# Patient Record
Sex: Female | Born: 1980
Health system: Southern US, Community
[De-identification: ages and names within clinical notes are randomized; demographics above are authoritative.]

## PROBLEM LIST (undated history)

## (undated) ENCOUNTER — Inpatient Hospital Stay: Payer: Self-pay

## (undated) DIAGNOSIS — N939 Abnormal uterine and vaginal bleeding, unspecified: Secondary | ICD-10-CM

## (undated) DIAGNOSIS — R7301 Impaired fasting glucose: Secondary | ICD-10-CM

## (undated) DIAGNOSIS — N920 Excessive and frequent menstruation with regular cycle: Secondary | ICD-10-CM

## (undated) DIAGNOSIS — M793 Panniculitis, unspecified: Secondary | ICD-10-CM

## (undated) DIAGNOSIS — D649 Anemia, unspecified: Secondary | ICD-10-CM

## (undated) DIAGNOSIS — I10 Essential (primary) hypertension: Secondary | ICD-10-CM

## (undated) DIAGNOSIS — Z903 Acquired absence of stomach [part of]: Secondary | ICD-10-CM

## (undated) DIAGNOSIS — E785 Hyperlipidemia, unspecified: Secondary | ICD-10-CM

## (undated) DIAGNOSIS — O24419 Gestational diabetes mellitus in pregnancy, unspecified control: Secondary | ICD-10-CM

## (undated) DIAGNOSIS — E119 Type 2 diabetes mellitus without complications: Secondary | ICD-10-CM

## (undated) DIAGNOSIS — R7303 Prediabetes: Secondary | ICD-10-CM

## (undated) HISTORY — PX: WISDOM TOOTH EXTRACTION: SHX21

## (undated) HISTORY — DX: Morbid (severe) obesity due to excess calories: E66.01

## (undated) HISTORY — DX: Impaired fasting glucose: R73.01

## (undated) HISTORY — DX: Prediabetes: R73.03

---

## 2005-02-19 ENCOUNTER — Inpatient Hospital Stay: Payer: Self-pay | Admitting: Obstetrics & Gynecology

## 2007-07-06 ENCOUNTER — Encounter: Payer: Self-pay | Admitting: Maternal & Fetal Medicine

## 2007-12-16 ENCOUNTER — Observation Stay: Payer: Self-pay

## 2008-01-03 ENCOUNTER — Inpatient Hospital Stay: Payer: Self-pay

## 2012-09-18 ENCOUNTER — Encounter: Payer: Self-pay | Admitting: Maternal & Fetal Medicine

## 2012-10-02 ENCOUNTER — Encounter: Payer: Self-pay | Admitting: Obstetrics and Gynecology

## 2012-12-14 ENCOUNTER — Inpatient Hospital Stay: Payer: Self-pay

## 2012-12-14 LAB — CBC WITH DIFFERENTIAL/PLATELET
Basophil #: 0.1 10*3/uL (ref 0.0–0.1)
Basophil %: 0.4 %
Eosinophil #: 0.1 10*3/uL (ref 0.0–0.7)
Eosinophil %: 1.1 %
HCT: 38.2 % (ref 35.0–47.0)
HGB: 13 g/dL (ref 12.0–16.0)
Lymphocyte #: 3.2 10*3/uL (ref 1.0–3.6)
Lymphocyte %: 25.2 %
MCH: 29.4 pg (ref 26.0–34.0)
MCHC: 34.1 g/dL (ref 32.0–36.0)
MCV: 86 fL (ref 80–100)
Monocyte #: 0.9 x10 3/mm (ref 0.2–0.9)
Monocyte %: 6.9 %
Neutrophil #: 8.4 10*3/uL — ABNORMAL HIGH (ref 1.4–6.5)
Neutrophil %: 66.4 %
Platelet: 233 10*3/uL (ref 150–440)
RBC: 4.43 10*6/uL (ref 3.80–5.20)
RDW: 15.4 % — ABNORMAL HIGH (ref 11.5–14.5)
WBC: 12.6 10*3/uL — ABNORMAL HIGH (ref 3.6–11.0)

## 2012-12-15 LAB — HEMATOCRIT: HCT: 35.6 % (ref 35.0–47.0)

## 2014-12-06 NOTE — Consult Note (Signed)
Referral Information:   Reason for Referral 34 yo [id number] with EDD 12/16/12 by [facility]; measurements were consistent with 9 weeks 4 days. She is presently 27 weeks 2 days and referred to discuss options for recurrent preterm birth prevention. Her second pregnancy ended at 30 weeks following spontaneous preterm labor. She does report UTI treatment prior to onset of preterm labor.  Her subsequent pregnancies (and first pregnancy) were term.  She took 17P injections with her last pregnancy.  The ultrasound report from your office performed on 09/07/12 (25 weeks 5 days)demonstrates overall cervical length of 3.8cm, but with funneling present.  She reports intermittent, nonsustained contractions during the day.  She is very active and involved with the care of her 2 boys with severe autism.    Referring Physician [facility]    Prenatal Hx As above    Past Obstetrical Hx 2002, 38 weeks,  female  6lb 15 oz,  C/S arrest of descent/OP,  [facility] 2003, 30 weeks, female, 3lb 9 oz, diagnosed with UTI developed spontaneous preterm labor and [facility], infant remained in ICN x 6 weeks SAB at 10 weeks 2006, 39 weeks, 7lb 8 oz,  female, infant developed meningits(enterovirus) one week after birth, now with severe autism spectrum disorder 2009, 40 weeks, 8lb 6 oz, female, weekly 17P injections  In 2008, she and her husband adopted female infant who was also later diagnosed with autism   Home Medications: Medication Instructions Status  Tylenol 325 mg oral tablet 2 tab(s) orally every 3 hours as needed for pain Active  ibuprofen 400 mg oral tablet 1 tab(s) orally every 6 hours as needed for  Active  ferrous sulfate 1 tab(s)  once a day for 6 weeks Active  Prenatal Multivitamins oral tablet 1 daily while breastfeeding Active   Allergies:   NKA: None  Vital Signs/Notes:  Nursing Vital Signs: **Vital Signs.:   03-Feb-14 12:46   Vital Signs Type Routine    Temperature Temperature (F) 98.1   Celsius 36.7   Temperature Source oral   Pulse Pulse 89   Respirations Respirations 18   Systolic BP Systolic BP 132   Diastolic BP (mmHg) Diastolic BP (mmHg) 71   Mean BP 91   Perinatal Consult:   PMed Hx Rubella Immune, Hx of varicella    PSurg Hx Cesarean section, 2002; wisdom tooth extraction    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 We addressed the lack of benefit to 17OHP injections for recurrent preterm birth prevention in this setting.  She is beyond the upper gestational age for benefit.  It is unclear whether vaginal progesterone will benefit.  Her normal midtrimester cervical length, prior history of 3 term deliveries, and overall normal cervical length are favorable factors for her.  --We did, however, address the possible utility of screening tests such as fetal fibronectin should she continue to experience preterm contractions.  We addressed the test's negative predictive value and the reassurance this testing could provide because a negative result would provide assurance ~98%chance she would not deliver within the 2 weeks after the test was taken.  --We addressed the possible need for hospitalization and steroid administration should she experience cervical changes and contractions.  She  is wary of this situation due to her role as primary caregiver for her sons with special needs.    Recommendations 1. Recommend anatomic survey and cervical length at [facility] in 2 weeks.  A dynamic cervix is of unclear clinical significance, however, if cervical shortening is present and she endorses contractions, we will recommend possible admission and steroid  administration. 2. Consider fetal fibronectin at the time of her next office visit if concern for cervical shortening remains.  If positive, continue close surveillance and consider steroid administration.     Total Time Spent with Patient 30 minutes    >50% of visit spent in couseling/coordination of care yes   Coding Description: MATERNAL CONDITIONS/HISTORY INDICATION(S).   Previous C-section.   Threatened premature labor > 22 weeks, < 37 weeks.  Electronic Signatures: Consuelo Pandy (MD)  (Signed 03-Feb-14 14:52)  Authored: Referral, Home Medications, Allergies, Vital Signs/Notes, Consult, Lab/Radiology Notes, Impression, Billing, Coding Description   Last Updated: 03-Feb-14 14:52 by Consuelo Pandy (MD)

## 2014-12-24 NOTE — H&P (Signed)
L&D Evaluation:  History:  HPI 34yo [id number] at 39wks by 9wk U/S presents in labor.   [facility]  PNL - A+, RI , VI, GBS neg   Patient's Medical History No Chronic Illness   Patient's Surgical History Previous C-Section  follwed by 3 VBACs   Medications Pre Natal Vitamins   Allergies NKDA   Social History none   Family History Non-Contributory   ROS:  ROS All systems were reviewed.  HEENT, CNS, GI, GU, Respiratory, CV, Renal and Musculoskeletal systems were found to be normal.   Exam:  Vital Signs stable   General mild distress with contractions   Mental Status clear   Abdomen gravid, non-tender   Estimated Fetal Weight Average for gestational age   Pelvic 4/ 100 / -1   Mebranes Intact   FHT normal rate with no decels   Ucx regular   Skin dry   Lymph no lymphadenopathy   Impression:  Impression 34yo G6P4 at 39wks with TOLAC   Plan:  Plan EFM/NST, monitor contractions and for cervical change   Comments - 3 prior successful VBACs   Electronic Signatures: Orvan FalconerStansbury Clipp, Amadea Keagy K (MD)  (Signed 01-May-14 05:57)  Authored: L&D Evaluation   Last Updated: 01-May-14 05:57 by Garnette GunnerStansbury Clipp, Ali LoweEryn K (MD)

## 2015-02-28 ENCOUNTER — Encounter: Payer: Self-pay | Admitting: Family Medicine

## 2015-02-28 ENCOUNTER — Ambulatory Visit (INDEPENDENT_AMBULATORY_CARE_PROVIDER_SITE_OTHER): Payer: BLUE CROSS/BLUE SHIELD | Admitting: Family Medicine

## 2015-02-28 VITALS — BP 128/85 | HR 98 | Temp 98.8°F | Wt 232.0 lb

## 2015-02-28 DIAGNOSIS — R7301 Impaired fasting glucose: Secondary | ICD-10-CM | POA: Diagnosis not present

## 2015-02-28 HISTORY — DX: Morbid (severe) obesity due to excess calories: E66.01

## 2015-02-28 MED ORDER — BUPROPION HCL ER (XL) 150 MG PO TB24: 150.0000 mg | ORAL_TABLET | Freq: Every day | ORAL | Status: DC

## 2015-02-28 NOTE — Assessment & Plan Note (Signed)
Weight management discussion; adequate hydration, activity; start bupropion; avoid phentermine; avoid topirimate (still child-bearing possibility); hand-out form [email or web address] given

## 2015-02-28 NOTE — Assessment & Plan Note (Signed)
Check A1C today; weight loss encouraged

## 2015-02-28 NOTE — Progress Notes (Signed)
BP 128/85 mmHg  Pulse 98  Temp(Src) 98.8 F (37.1 C)  Wt 232 lb (105.235 kg)  SpO2 96%  LMP 02/17/2015 (Approximate)   Subjective:    Patient ID: Bonnie Armstrong, female    DOB: November 18, 1980, 34 y.o.   MRN: [id number]  HPI: Bonnie Armstrong is a 34 y.o. female  Chief Complaint  Patient presents with  . Establish Care    Would like to also discuss prediabeties   She is here to establish with me (not new to the practice) She has been under the care of an OB-GYN Two severely disabled children, high stress at home; husband is bipolar; sleep deprivation for the last 10 years, 6 children at home; she told her OB-GYN about this and they checked her for sugar; she has been more than 200 pounds for most of her marriage Borderline diabetes with the last bloodwork, they did the 3 month average and it was in the 6 range, "knocking at the door" Has always struggled with food; lost 30 pounds after hearing that, it was a wake up call, then gained back There is no changing the home environment; the reality is that she is health problems, does not want excuses, knows she puts the food in her mouth; can't just stop; has tried 3 programs in the last year with her mother's help; grandmothers were almost 300 pounds, mother is tiny; great-grandmother also 300 pounds at one point, then down to 200 pounds before she passed Maternal grandfather had type 2 diabetes The test was a year ago in August; she has been checking Highest weight was 250 pounds She has only dipped under 200 pounds three times during her marriage over last 16 years She keeps her food separate, driving in vehicle and going to [employer] for her autistic son She would love to get under 200 pounds Her last cholesterol panel was normal She had a pap smear last summer She can lose weight, but she knows she'll never change the circumstances; she is a stress eater  Relevant past medical, surgical, family and social history reviewed and updated  as indicated. Interim medical history since our last visit reviewed. Mother had pituitary tumor; also hypothyroidism  Allergies and medications reviewed and updated.  Review of Systems  Per HPI unless specifically indicated above     Objective:    BP 128/85 mmHg  Pulse 98  Temp(Src) 98.8 F (37.1 C)  Wt 232 lb (105.235 kg)  SpO2 96%  LMP 02/17/2015 (Approximate)  Wt Readings from Last 3 Encounters:  02/28/15 232 lb (105.235 kg)  06/30/12 228 lb (103.42 kg)    Physical Exam  Constitutional: She appears well-developed and well-nourished.  HENT:  Mouth/Throat: Mucous membranes are normal.  Eyes: No scleral icterus.  Neck: No thyromegaly present.  Cardiovascular: Normal rate and regular rhythm.   Pulmonary/Chest: Effort normal and breath sounds normal.  Abdominal: She exhibits no distension.  Neurological: She is alert. She displays no tremor.  Skin: Skin is warm. No pallor.  Psychiatric: She has a normal mood and affect. Judgment and thought content normal. She is hyperactive (a little hyperactive, talkative, but redirectable).  donut for breakfast; coffee two hours ago, no lunch      Assessment & Plan:   Problem List Items Addressed This Visit      Endocrine   Impaired fasting glucose - Primary    Check A1C today; weight loss encouraged      Relevant Orders   Hgb A1c w/o eAG  Comprehensive metabolic panel   Lipid Panel w/o Chol/HDL Ratio     Other   Morbid obesity    Weight management discussion; adequate hydration, activity; start bupropion; avoid phentermine; avoid topirimate (still child-bearing possibility); hand-out form [email or web address] given          Follow up plan: Return in about 6 weeks (around 04/11/2015) for prediabetes, weight management.

## 2015-02-28 NOTE — Patient Instructions (Addendum)
Check out the information at [email or web address] entitled "What It Takes to Lose Weight" Try to lose between 1-2 pounds per week by taking in fewer calories and burning off more calories You can succeed by limiting portions, limiting foods dense in calories and fat, becoming more active, and drinking 8 glasses of water a day Don't skip meals, especially breakfast, as skipping meals may alter your metabolism Do not use over-the-counter weight loss pills or gimmicks that claim rapid weight loss A healthy BMI (or body mass index) is between 18.5 and 24.9 You can calculate your ideal BMI at the NIH website [employer]huhttp://www.nhlbi.nih.gov/health/educational/lose_wt/BMI/bmicalc.htm Let's start new medicine to help cut down on reward and cravings Return in 6 weeks for recheck

## 2015-03-01 LAB — LIPID PANEL W/O CHOL/HDL RATIO
Cholesterol, Total: 202 mg/dL — ABNORMAL HIGH (ref 100–199)
HDL: 52 mg/dL (ref >39)
LDL Calculated: 127 mg/dL — ABNORMAL HIGH (ref 0–99)
Triglycerides: 115 mg/dL (ref 0–149)
VLDL Cholesterol Cal: 23 mg/dL (ref 5–40)

## 2015-03-01 LAB — COMPREHENSIVE METABOLIC PANEL
ALT: 15 IU/L (ref 0–32)
AST: 13 IU/L (ref 0–40)
Albumin/Globulin Ratio: 2 (ref 1.1–2.5)
Albumin: 4.4 g/dL (ref 3.5–5.5)
Alkaline Phosphatase: 79 IU/L (ref 39–117)
BUN/Creatinine Ratio: 15 (ref 8–20)
BUN: 11 mg/dL (ref 6–20)
Bilirubin Total: <0.2 mg/dL (ref 0.0–1.2)
CO2: 24 mmol/L (ref 18–29)
Calcium: 8.9 mg/dL (ref 8.7–10.2)
Chloride: 102 mmol/L (ref 97–108)
Creatinine, Ser: 0.73 mg/dL (ref 0.57–1.00)
GFR calc Af Amer: 123 mL/min/{1.73_m2} (ref >59)
GFR calc non Af Amer: 106 mL/min/{1.73_m2} (ref >59)
Globulin, Total: 2.2 g/dL (ref 1.5–4.5)
Glucose: 95 mg/dL (ref 65–99)
Potassium: 4 mmol/L (ref 3.5–5.2)
Sodium: 142 mmol/L (ref 134–144)
Total Protein: 6.6 g/dL (ref 6.0–8.5)

## 2015-03-01 LAB — HGB A1C W/O EAG: Hgb A1c MFr Bld: 6.1 % — ABNORMAL HIGH (ref 4.8–5.6)

## 2015-03-05 ENCOUNTER — Encounter: Payer: Self-pay | Admitting: Family Medicine

## 2015-04-02 ENCOUNTER — Ambulatory Visit (INDEPENDENT_AMBULATORY_CARE_PROVIDER_SITE_OTHER): Payer: BLUE CROSS/BLUE SHIELD | Admitting: Family Medicine

## 2015-04-02 ENCOUNTER — Encounter: Payer: Self-pay | Admitting: Family Medicine

## 2015-04-02 VITALS — BP 129/85 | HR 93 | Temp 98.3°F | Wt 242.0 lb

## 2015-04-02 DIAGNOSIS — M25461 Effusion, right knee: Secondary | ICD-10-CM | POA: Diagnosis not present

## 2015-04-02 DIAGNOSIS — M25561 Pain in right knee: Secondary | ICD-10-CM

## 2015-04-02 DIAGNOSIS — R7301 Impaired fasting glucose: Secondary | ICD-10-CM

## 2015-04-02 NOTE — Patient Instructions (Addendum)
Ice the area of pain and swelling for 20 minutes at a time 3 times a day; never ice right up against skin Try turmeric as a natural anti-inflammatory (for pain and arthritis). It comes in capsules where you buy aspirin and fish oil, but also as a spice where you buy pepper and garlic powder. I have entered a referral for you for the orthopaedist If you have not heard anything from my staff in a week about any orders/referrals/studies from today, please contact us here to follow-up [phone] Please do work with a Veterinary surgeon, consider Ramond Dial or others Consider getting a bicycle inexpensively and keep the metabolism moving during the day Drink 64 ounces of water daily See the hand-out from [email or web address] Commit to increasing your activity Allow healthy snacks for yourself

## 2015-04-02 NOTE — Assessment & Plan Note (Signed)
We reached a breakthrough and realized counseling is key; refer to therapist and nutritionist

## 2015-04-02 NOTE — Progress Notes (Signed)
BP 129/85 mmHg  Pulse 93  Temp(Src) 98.3 F (36.8 C)  Wt 242 lb (109.77 kg)  SpO2 98%  LMP 03/19/2015 (Approximate)   Subjective:    Patient ID: Bonnie Armstrong, female    DOB: December 06, 1980, 34 y.o.   MRN: [id number]  HPI: Bonnie Armstrong is a 34 y.o. female  Chief Complaint  Patient presents with  . Weight Check    tried wellbutrin, but it made her gain weight  . Knee Pain    right knee   Her right knee is bothering her; she has a lump on the right leg; no injury to the knee; she sits on the floor, but nothing new there; the knee hurts; she has knee pain plus she is heavy; she has thought that if she loses weight, it won't hurt as bad; it will fill with fluid and it will go down; today's swelling is pretty small compared to other days; the swelling just started last week; used ACE compression bandage; no help; feels tight and spongy; tight when flexing; no heat, no redness; she has not tried anything else besides the compression bandage; the swelling is just prox and lateral to the right knee, but the pain is in the knee under the kneecap; no locking or popping; pain was throbbing like crazy yesterday  She actually gained weight on the wellbutrin; she says [name] has gone through a period of insomnia, took the edge of things during the day but nightime is hard; she says they are excuses; she says it is eventually going to affect health organs; the issues with her son are just getting worse; she took the wellbutrin for two weeks; the answer is not just the pill, but not dramatically helping; she is using food as a coping mechanism; she felt overweight in the 4th grade, has been looking at food differently, sneaking food and not letting people; 150 pounds in the 7th grade; weighed 165 pounds or 170 pounds when she got married; she was 200 pounds when she got pregnant the next year; she doesn't feel the problem is [name]; there is stress in the house; she knows she has some PTSD  Relevant  past medical, surgical, family and social history reviewed and updated as indicated. Interim medical history since our last visit reviewed. Allergies and medications reviewed and updated.  Review of Systems  Per HPI unless specifically indicated above     Objective:    BP 129/85 mmHg  Pulse 93  Temp(Src) 98.3 F (36.8 C)  Wt 242 lb (109.77 kg)  SpO2 98%  LMP 03/19/2015 (Approximate)  Wt Readings from Last 3 Encounters:  04/02/15 242 lb (109.77 kg)  02/28/15 232 lb (105.235 kg)  06/30/12 228 lb (103.42 kg)    Physical Exam  Constitutional: She appears well-developed and well-nourished. No distress.  obese  Eyes:  No proptosis or exophthalmos  Cardiovascular: Normal rate.   Pulmonary/Chest: Effort normal.  Abdominal: She exhibits no distension.  Musculoskeletal:       Right knee: She exhibits decreased range of motion, swelling and effusion. She exhibits no ecchymosis, no deformity and no erythema. No medial joint line, no lateral joint line, no MCL, no LCL and no patellar tendon tenderness noted.  Neurological: She is alert. She displays no tremor.  Psychiatric: She has a normal mood and affect. Her behavior is normal. Judgment and thought content normal.  Talkative, but very pleasant, good eye contact witih examiner    Results for orders placed or performed  in visit on 02/28/15  Hgb A1c w/o eAG  Result Value Ref Range   Hgb A1c MFr Bld 6.1 (H) 4.8 - 5.6 %  Comprehensive metabolic panel  Result Value Ref Range   Glucose 95 65 - 99 mg/dL   BUN 11 6 - 20 mg/dL   Creatinine, Ser 1.61 0.57 - 1.00 mg/dL   GFR calc non Af Amer 106 >59 mL/min/1.73   GFR calc Af Amer 123 >59 mL/min/1.73   BUN/Creatinine Ratio 15 8 - 20   Sodium 142 134 - 144 mmol/L   Potassium 4.0 3.5 - 5.2 mmol/L   Chloride 102 97 - 108 mmol/L   CO2 24 18 - 29 mmol/L   Calcium 8.9 8.7 - 10.2 mg/dL   Total Protein 6.6 6.0 - 8.5 g/dL   Albumin 4.4 3.5 - 5.5 g/dL   Globulin, Total 2.2 1.5 - 4.5 g/dL    Albumin/Globulin Ratio 2.0 1.1 - 2.5   Bilirubin Total <0.2 0.0 - 1.2 mg/dL   Alkaline Phosphatase 79 39 - 117 IU/L   AST 13 0 - 40 IU/L   ALT 15 0 - 32 IU/L  Lipid Panel w/o Chol/HDL Ratio  Result Value Ref Range   Cholesterol, Total 202 (H) 100 - 199 mg/dL   Triglycerides 096 0 - 149 mg/dL   HDL 52 >04 mg/dL   VLDL Cholesterol Cal 23 5 - 40 mg/dL   LDL Calculated 540 (H) 0 - 99 mg/dL      Assessment & Plan:   Problem List Items Addressed This Visit      Endocrine   Impaired fasting glucose    Weight loss key; last A1C is 6.1; absolutely explained that weight lsos can help this; check 6 months from last draw, January 15th or just after        Other   Morbid obesity    We reached a breakthrough and realized counseling is key; refer to therapist and nutritionist      Pain and swelling of right knee - Primary   Relevant Orders   Ambulatory referral to Orthopedic Surgery      Follow up plan: Return in about 6 weeks (around 05/14/2015).  Face-to-face time with patient was more than 25 minutes, >50% time spent counseling and coordination of care

## 2015-04-02 NOTE — Assessment & Plan Note (Signed)
Weight loss key; last A1C is 6.1; absolutely explained that weight lsos can help this; check 6 months from last draw, January 15th or just after

## 2015-04-11 ENCOUNTER — Ambulatory Visit: Payer: BLUE CROSS/BLUE SHIELD | Admitting: Family Medicine

## 2015-05-15 ENCOUNTER — Ambulatory Visit: Payer: BLUE CROSS/BLUE SHIELD | Admitting: Family Medicine

## 2015-07-07 ENCOUNTER — Encounter: Payer: Self-pay | Admitting: Family Medicine

## 2016-04-15 ENCOUNTER — Other Ambulatory Visit: Payer: Self-pay | Admitting: Family Medicine

## 2016-04-15 ENCOUNTER — Telehealth: Payer: Self-pay | Admitting: Family Medicine

## 2016-04-15 ENCOUNTER — Ambulatory Visit (INDEPENDENT_AMBULATORY_CARE_PROVIDER_SITE_OTHER): Payer: BLUE CROSS/BLUE SHIELD | Admitting: Family Medicine

## 2016-04-15 ENCOUNTER — Encounter: Payer: Self-pay | Admitting: Family Medicine

## 2016-04-15 VITALS — BP 128/70 | HR 96 | Temp 98.3°F | Resp 14 | Wt 244.0 lb

## 2016-04-15 DIAGNOSIS — R7301 Impaired fasting glucose: Secondary | ICD-10-CM

## 2016-04-15 DIAGNOSIS — Z0000 Encounter for general adult medical examination without abnormal findings: Secondary | ICD-10-CM

## 2016-04-15 DIAGNOSIS — Z124 Encounter for screening for malignant neoplasm of cervix: Secondary | ICD-10-CM

## 2016-04-15 DIAGNOSIS — Z789 Other specified health status: Secondary | ICD-10-CM | POA: Diagnosis not present

## 2016-04-15 DIAGNOSIS — R002 Palpitations: Secondary | ICD-10-CM

## 2016-04-15 DIAGNOSIS — R6889 Other general symptoms and signs: Secondary | ICD-10-CM

## 2016-04-15 LAB — HM PAP SMEAR: HM Pap smear: NEGATIVE

## 2016-04-15 NOTE — Assessment & Plan Note (Signed)
Check labs 

## 2016-04-15 NOTE — Assessment & Plan Note (Signed)
Lost 11 pounds over last month; check TSH

## 2016-04-15 NOTE — Assessment & Plan Note (Signed)
USPSTF grade A and B recommendations reviewed with patient; age-appropriate recommendations, preventive care, screening tests, etc discussed and encouraged; healthy living encouraged; see AVS for patient education given to patient  

## 2016-04-15 NOTE — Patient Instructions (Addendum)
Return tomorrow or next week for fasting labs  Check out the information at familydoctor.org entitled "Nutrition for Weight Loss: What You Need to Know about Fad Diets" Try to lose between 1-2 pounds per week by taking in fewer calories and burning off more calories You can succeed by limiting portions, limiting foods dense in calories and fat, becoming more active, and drinking 8 glasses of water a day (64 ounces) Don't skip meals, especially breakfast, as skipping meals may alter your metabolism Do not use over-the-counter weight loss pills or gimmicks that claim rapid weight loss A healthy BMI (or body mass index) is between 18.5 and 24.9 You can calculate your ideal BMI at the [place] website [employer]fr  Health Maintenance, Female Adopting a healthy lifestyle and getting preventive care can go a long way to promote health and wellness. Talk with your health care provider about what schedule of regular examinations is right for you. This is a good chance for you to check in with your provider about disease prevention and staying healthy. In between checkups, there are plenty of things you can do on your own. Experts have done a lot of research about which lifestyle changes and preventive measures are most likely to keep you healthy. Ask your health care provider for more information. WEIGHT AND DIET  Eat a healthy diet  Be sure to include plenty of vegetables, fruits, low-fat dairy products, and lean protein.  Do not eat a lot of foods high in solid fats, added sugars, or salt.  Get regular exercise. This is one of the most important things you can do for your health.  Most adults should exercise for at least 150 minutes each week. The exercise should increase your heart rate and make you sweat (moderate-intensity exercise).  Most adults should also do strengthening exercises at least twice a week. This is in addition to the  moderate-intensity exercise.  Maintain a healthy weight  Body mass index (BMI) is a measurement that can be used to identify possible weight problems. It estimates body fat based on height and weight. Your health care provider can help determine your BMI and help you achieve or maintain a healthy weight.  For females 37 years of age and older:   A BMI below 18.5 is considered underweight.  A BMI of 18.5 to 24.9 is normal.  A BMI of 25 to 29.9 is considered overweight.  A BMI of 30 and above is considered obese.  Watch levels of cholesterol and blood lipids  You should start having your blood tested for lipids and cholesterol at 37 years of age, then have this test every 5 years.  You may need to have your cholesterol levels checked more often if:  Your lipid or cholesterol levels are high.  You are older than 38 years of age.  You are at high risk for heart disease.  CANCER SCREENING   Lung Cancer  Lung cancer screening is recommended for adults 37-37 years old who are at high risk for lung cancer because of a history of smoking.  A yearly low-dose CT scan of the lungs is recommended for people who:  Currently smoke.  Have quit within the past 15 years.  Have at least a 30-pack-year history of smoking. A pack year is smoking an average of one pack of cigarettes a day for 1 year.  Yearly screening should continue until it has been 15 years since you quit.  Yearly screening should stop if you develop a health problem  that would prevent you from having lung cancer treatment.  Breast Cancer  Practice breast self-awareness. This means understanding how your breasts normally appear and feel.  It also means doing regular breast self-exams. Let your health care provider know about any changes, no matter how small.  If you are in your 20s or 30s, you should have a clinical breast exam (CBE) by a health care provider every 1-3 years as part of a regular health exam.  If  you are 65 or older, have a CBE every year. Also consider having a breast X-ray (mammogram) every year.  If you have a family history of breast cancer, talk to your health care provider about genetic screening.  If you are at high risk for breast cancer, talk to your health care provider about having an MRI and a mammogram every year.  Breast cancer gene (BRCA) assessment is recommended for women who have family members with BRCA-related cancers. BRCA-related cancers include:  Breast.  Ovarian.  Tubal.  Peritoneal cancers.  Results of the assessment will determine the need for genetic counseling and BRCA1 and BRCA2 testing. Cervical Cancer Your health care provider may recommend that you be screened regularly for cancer of the pelvic organs (ovaries, uterus, and vagina). This screening involves a pelvic examination, including checking for microscopic changes to the surface of your cervix (Pap test). You may be encouraged to have this screening done every 3 years, beginning at age 16.  For women ages 14-65, health care providers may recommend pelvic exams and Pap testing every 3 years, or they may recommend the Pap and pelvic exam, combined with testing for human papilloma virus (HPV), every 5 years. Some types of HPV increase your risk of cervical cancer. Testing for HPV may also be done on women of any age with unclear Pap test results.  Other health care providers may not recommend any screening for nonpregnant women who are considered low risk for pelvic cancer and who do not have symptoms. Ask your health care provider if a screening pelvic exam is right for you.  If you have had past treatment for cervical cancer or a condition that could lead to cancer, you need Pap tests and screening for cancer for at least 20 years after your treatment. If Pap tests have been discontinued, your risk factors (such as having a new sexual partner) need to be reassessed to determine if screening should  resume. Some women have medical problems that increase the chance of getting cervical cancer. In these cases, your health care provider may recommend more frequent screening and Pap tests. Colorectal Cancer  This type of cancer can be detected and often prevented.  Routine colorectal cancer screening usually begins at 37 years of age and continues through 37 years of age.  Your health care provider may recommend screening at an earlier age if you have risk factors for colon cancer.  Your health care provider may also recommend using home test kits to check for hidden blood in the stool.  A small camera at the end of a tube can be used to examine your colon directly (sigmoidoscopy or colonoscopy). This is done to check for the earliest forms of colorectal cancer.  Routine screening usually begins at age 84.  Direct examination of the colon should be repeated every 5-10 years through 37 years of age. However, you may need to be screened more often if early forms of precancerous polyps or small growths are found. Skin Cancer  Check your skin  from head to toe regularly.  Tell your health care provider about any new moles or changes in moles, especially if there is a change in a mole's shape or color.  Also tell your health care provider if you have a mole that is larger than the size of a pencil eraser.  Always use sunscreen. Apply sunscreen liberally and repeatedly throughout the day.  Protect yourself by wearing long sleeves, pants, a wide-brimmed hat, and sunglasses whenever you are outside. HEART DISEASE, DIABETES, AND HIGH BLOOD PRESSURE   High blood pressure causes heart disease and increases the risk of stroke. High blood pressure is more likely to develop in:  People who have blood pressure in the high end of the normal range (130-139/85-89 mm Hg).  People who are overweight or obese.  People who are African American.  If you are 70-46 years of age, have your blood pressure  checked every 3-5 years. If you are 86 years of age or older, have your blood pressure checked every year. You should have your blood pressure measured twice--once when you are at a hospital or clinic, and once when you are not at a hospital or clinic. Record the average of the two measurements. To check your blood pressure when you are not at a hospital or clinic, you can use:  An automated blood pressure machine at a pharmacy.  A home blood pressure monitor.  If you are between 22 years and 58 years old, ask your health care provider if you should take aspirin to prevent strokes.  Have regular diabetes screenings. This involves taking a blood sample to check your fasting blood sugar level.  If you are at a normal weight and have a low risk for diabetes, have this test once every three years after 37 years of age.  If you are overweight and have a high risk for diabetes, consider being tested at a younger age or more often. PREVENTING INFECTION  Hepatitis B  If you have a higher risk for hepatitis B, you should be screened for this virus. You are considered at high risk for hepatitis B if:  You were born in a country where hepatitis B is common. Ask your health care provider which countries are considered high risk.  Your parents were born in a high-risk country, and you have not been immunized against hepatitis B (hepatitis B vaccine).  You have HIV or AIDS.  You use needles to inject street drugs.  You live with someone who has hepatitis B.  You have had sex with someone who has hepatitis B.  You get hemodialysis treatment.  You take certain medicines for conditions, including cancer, organ transplantation, and autoimmune conditions. Hepatitis C  Blood testing is recommended for:  Everyone born from [birth year] through 1967.  Anyone with known risk factors for hepatitis C. Sexually transmitted infections (STIs)  You should be screened for sexually transmitted infections (STIs)  including gonorrhea and chlamydia if:  You are sexually active and are younger than 37 years of age.  You are older than 37 years of age and your health care provider tells you that you are at risk for this type of infection.  Your sexual activity has changed since you were last screened and you are at an increased risk for chlamydia or gonorrhea. Ask your health care provider if you are at risk.  If you do not have HIV, but are at risk, it may be recommended that you take a prescription medicine daily to prevent  HIV infection. This is called pre-exposure prophylaxis (PrEP). You are considered at risk if:  You are sexually active and do not regularly use condoms or know the HIV status of your partner(s).  You take drugs by injection.  You are sexually active with a partner who has HIV. Talk with your health care provider about whether you are at high risk of being infected with HIV. If you choose to begin PrEP, you should first be tested for HIV. You should then be tested every 3 months for as long as you are taking PrEP.  PREGNANCY   If you are premenopausal and you may become pregnant, ask your health care provider about preconception counseling.  If you may become pregnant, take 400 to 800 micrograms (mcg) of folic acid every day.  If you want to prevent pregnancy, talk to your health care provider about birth control (contraception). OSTEOPOROSIS AND MENOPAUSE   Osteoporosis is a disease in which the bones lose minerals and strength with aging. This can result in serious bone fractures. Your risk for osteoporosis can be identified using a bone density scan.  If you are 69 years of age or older, or if you are at risk for osteoporosis and fractures, ask your health care provider if you should be screened.  Ask your health care provider whether you should take a calcium or vitamin D supplement to lower your risk for osteoporosis.  Menopause may have certain physical symptoms and  risks.  Hormone replacement therapy may reduce some of these symptoms and risks. Talk to your health care provider about whether hormone replacement therapy is right for you.  HOME CARE INSTRUCTIONS   Schedule regular health, dental, and eye exams.  Stay current with your immunizations.   Do not use any tobacco products including cigarettes, chewing tobacco, or electronic cigarettes.  If you are pregnant, do not drink alcohol.  If you are breastfeeding, limit how much and how often you drink alcohol.  Limit alcohol intake to no more than 1 drink per day for nonpregnant women. One drink equals 12 ounces of beer, 5 ounces of wine, or 1 ounces of hard liquor.  Do not use street drugs.  Do not share needles.  Ask your health care provider for help if you need support or information about quitting drugs.  Tell your health care provider if you often feel depressed.  Tell your health care provider if you have ever been abused or do not feel safe at home.   This information is not intended to replace advice given to you by your health care provider. Make sure you discuss any questions you have with your health care provider.   Document Released: 02/15/2011 Document Revised: 08/23/2014 Document Reviewed: 07/04/2013 Elsevier Interactive Patient Education [employer].

## 2016-04-15 NOTE — Progress Notes (Signed)
Patient ID: Bonnie Armstrong, female   DOB: 06-Apr-1981, 35 y.o.   MRN: [id number]   Subjective:   Bonnie Armstrong is a 35 y.o. female here for a complete physical exam  Interim issues since last visit: no trips to the ER; has some swelling in one ankle; maybe stress and caffeine, palpitations; tried to stop caffeine for two weeks and swelling went away, palpitations went away; not dramatic, little shortness of breath and awareness of heartbeat; stress, six children, two with autism; lost 11 pounds over the last month  USPSTF grade A and B recommendations Alcohol: no Depression:. Depression screen [facility] 2/9 04/15/2016  Decreased Interest 0  Down, Depressed, Hopeless 0  PHQ - 2 Score 0    Hypertension: recheck Obesity: "packing on pounds", mother with hypothyroidism Tobacco use: no HIV, hep B, hep C: declined STD testing and prevention (chl/gon/syphilis): declined Lipids: nonfasting Glucose: nonfasting  Colorectal cancer: PGF died from colon cancer; was 35 yo Breast cancer: no lumps BRCA gene screening: no breast or ovarian Intimate partner violence: no Cervical cancer screening: no hx of abnormal pap smear; last pap 3 years ago Diet: used to be typical American eater, trying to do better Exercise: active with family, but trying to swim 3-4 x a week; has pool in back yard now Skin cancer: mole on chest, right of midline, unchanged for decades   Past Medical History:  Diagnosis Date  . IFG (impaired fasting glucose)   . Morbid obesity ([place]) 02/28/2015   Past Surgical History:  Procedure Laterality Date  . CESAREAN SECTION  2002  . WISDOM TOOTH EXTRACTION     Family History  Problem Relation Age of Onset  . Hypertension Mother   . Hyperlipidemia Mother   . Diabetes Maternal Grandfather   . COPD Paternal Grandmother   . Cancer Paternal Grandfather     colon  Mother hypothyroidism Father has had colonoscopies to see if he has any colon cancer, since his father had colon  cancer, so far his have been okay  Social History  Substance Use Topics  . Smoking status: Never Smoker  . Smokeless tobacco: Never Used  . Alcohol use No   Review of Systems  Objective:   Vitals:   04/15/16 1111 04/15/16 1155  BP: 136/76 128/70  Pulse: 96   Resp: 14   Temp: 98.3 F (36.8 C)   TempSrc: Oral   SpO2: 96%   Weight: 244 lb (110.7 kg)    Body mass index is 43.22 kg/m. Wt Readings from Last 3 Encounters:  04/15/16 244 lb (110.7 kg)  04/02/15 242 lb (109.8 kg)  02/28/15 232 lb (105.2 kg)   Physical Exam  Constitutional: She appears well-developed and well-nourished.  HENT:  Head: Normocephalic and atraumatic.  Eyes: Conjunctivae and EOM are normal. Right eye exhibits no hordeolum. Left eye exhibits no hordeolum. No scleral icterus.  Neck: Carotid bruit is not present. No thyromegaly present.  Cardiovascular: Normal rate, regular rhythm, S1 normal, S2 normal and normal heart sounds.   No extrasystoles are present.  Pulmonary/Chest: Effort normal and breath sounds normal. No respiratory distress. Right breast exhibits no inverted nipple, no mass, no nipple discharge, no skin change and no tenderness. Left breast exhibits no inverted nipple, no mass, no nipple discharge, no skin change and no tenderness. Breasts are symmetrical.  Abdominal: Soft. Normal appearance and bowel sounds are normal. She exhibits no distension, no abdominal bruit, no pulsatile midline mass and no mass. There is no hepatosplenomegaly. There is  no tenderness. No hernia.  Genitourinary: Uterus normal. Pelvic exam was performed with patient prone. There is no rash or lesion on the right labia. There is no rash or lesion on the left labia. Cervix exhibits no motion tenderness, no discharge and no friability. Right adnexum displays no mass, no tenderness and no fullness. Left adnexum displays no mass, no tenderness and no fullness. No erythema, tenderness or bleeding in the vagina. No signs of injury  around the vagina. No vaginal discharge found.  Genitourinary Comments: Mucous / cervical plug present  Musculoskeletal: Normal range of motion. She exhibits no edema.  Lymphadenopathy:       Head (right side): No submandibular adenopathy present.       Head (left side): No submandibular adenopathy present.    She has no cervical adenopathy.    She has no axillary adenopathy.  Neurological: She is alert. She displays no tremor. No cranial nerve deficit. She exhibits normal muscle tone. Gait normal.  Skin: Skin is warm and dry. No bruising and no ecchymosis noted. No cyanosis. No pallor.  Psychiatric: Her speech is normal and behavior is normal. Thought content normal. Her mood appears not anxious. She does not exhibit a depressed mood.    Assessment/Plan:   Problem List Items Addressed This Visit      Endocrine   Impaired fasting glucose    Check glucose (fasting) and A1c; weight loss will be the single best thing she can do to slow / prevent progression to frank diabetes      Relevant Orders   Hemoglobin A1c     Other   Unintentional weight change    Lost 11 pounds over last month; check TSH      Preventative health care - Primary    USPSTF grade A and B recommendations reviewed with patient; age-appropriate recommendations, preventive care, screening tests, etc discussed and encouraged; healthy living encouraged; see AVS for patient education given to patient      Relevant Orders   TSH   COMPLETE METABOLIC PANEL WITH GFR   CBC with Differential/Platelet   Lipid panel   Palpitations    Check labs      Relevant Orders   Magnesium   Cervical cancer screening    Pap smear collected today      Relevant Orders   Pap,SurePath with HPV    Other Visit Diagnoses   None.      No orders of the defined types were placed in this encounter.  Orders Placed This Encounter  Procedures  . TSH    Standing Status:   Future    Standing Expiration Date:   05/15/2016  .  Magnesium    Standing Status:   Future    Standing Expiration Date:   05/15/2016  . COMPLETE METABOLIC PANEL WITH GFR    Standing Status:   Future    Standing Expiration Date:   05/15/2016  . CBC with Differential/Platelet    Standing Status:   Future    Standing Expiration Date:   05/15/2016  . Lipid panel    Standing Status:   Future    Standing Expiration Date:   05/15/2016  . Hemoglobin A1c    Standing Status:   Future    Standing Expiration Date:   05/15/2016    Follow up plan: Return in about 1 year (around 04/15/2017) for complete physical; 3 months for weight.  An After Visit Summary was printed and given to the patient.

## 2016-04-15 NOTE — Assessment & Plan Note (Signed)
Pap smear collected today 

## 2016-04-16 LAB — PAP IG AND HPV HIGH-RISK: HPV DNA High Risk: NOT DETECTED

## 2016-04-19 ENCOUNTER — Encounter: Payer: Self-pay | Admitting: Family Medicine

## 2016-04-19 NOTE — Assessment & Plan Note (Signed)
Check glucose (fasting) and A1c; weight loss will be the single best thing she can do to slow / prevent progression to frank diabetes

## 2016-04-20 NOTE — Telephone Encounter (Signed)
Ready to be released (thank you)

## 2016-04-21 ENCOUNTER — Other Ambulatory Visit: Payer: Self-pay

## 2016-04-21 DIAGNOSIS — R7301 Impaired fasting glucose: Secondary | ICD-10-CM

## 2016-04-21 DIAGNOSIS — R002 Palpitations: Secondary | ICD-10-CM

## 2016-04-21 DIAGNOSIS — Z0000 Encounter for general adult medical examination without abnormal findings: Secondary | ICD-10-CM

## 2016-04-22 LAB — COMPLETE METABOLIC PANEL WITH GFR
ALT: 17 U/L (ref 6–29)
AST: 14 U/L (ref 10–30)
Albumin: 4.2 g/dL (ref 3.6–5.1)
Alkaline Phosphatase: 72 U/L (ref 33–115)
BUN: 10 mg/dL (ref 7–25)
CO2: 27 mmol/L (ref 20–31)
Calcium: 9.1 mg/dL (ref 8.6–10.2)
Chloride: 106 mmol/L (ref 98–110)
Creat: 0.68 mg/dL (ref 0.50–1.10)
GFR, Est African American: >89 mL/min (ref >=60)
GFR, Est Non African American: >89 mL/min (ref >=60)
Glucose, Bld: 104 mg/dL — ABNORMAL HIGH (ref 65–99)
Potassium: 4.6 mmol/L (ref 3.5–5.3)
Sodium: 138 mmol/L (ref 135–146)
Total Bilirubin: 0.4 mg/dL (ref 0.2–1.2)
Total Protein: 6.8 g/dL (ref 6.1–8.1)

## 2016-04-22 LAB — CBC WITH DIFFERENTIAL/PLATELET
Basophils Absolute: 0 cells/uL (ref 0–200)
Basophils Relative: 0 %
Eosinophils Absolute: 92 cells/uL (ref 15–500)
Eosinophils Relative: 1 %
HCT: 40.1 % (ref 35.0–45.0)
Hemoglobin: 13.4 g/dL (ref 11.7–15.5)
Lymphocytes Relative: 27 %
Lymphs Abs: 2484 cells/uL (ref 850–3900)
MCH: 27.9 pg (ref 27.0–33.0)
MCHC: 33.4 g/dL (ref 32.0–36.0)
MCV: 83.4 fL (ref 80.0–100.0)
MPV: 10.5 fL (ref 7.5–12.5)
Monocytes Absolute: 460 cells/uL (ref 200–950)
Monocytes Relative: 5 %
Neutro Abs: 6164 cells/uL (ref 1500–7800)
Neutrophils Relative %: 67 %
Platelets: 308 10*3/uL (ref 140–400)
RBC: 4.81 MIL/uL (ref 3.80–5.10)
RDW: 14.1 % (ref 11.0–15.0)
WBC: 9.2 10*3/uL (ref 3.8–10.8)

## 2016-04-22 LAB — LIPID PANEL
Cholesterol: 194 mg/dL (ref 125–200)
HDL: 46 mg/dL (ref >=46)
LDL Cholesterol: 123 mg/dL (ref <130)
Total CHOL/HDL Ratio: 4.2 Ratio (ref <=5.0)
Triglycerides: 124 mg/dL (ref <150)
VLDL: 25 mg/dL (ref <30)

## 2016-04-22 LAB — TSH: TSH: 1.05 mIU/L

## 2016-04-22 LAB — MAGNESIUM: Magnesium: 2.1 mg/dL (ref 1.5–2.5)

## 2016-04-23 LAB — HEMOGLOBIN A1C
Hgb A1c MFr Bld: 5.8 % — ABNORMAL HIGH (ref <5.7)
Mean Plasma Glucose: 120 mg/dL

## 2016-04-29 ENCOUNTER — Encounter: Payer: Self-pay | Admitting: Family Medicine

## 2016-04-29 NOTE — Progress Notes (Signed)
Letter to pt re: labs 

## 2016-07-16 ENCOUNTER — Ambulatory Visit: Payer: BLUE CROSS/BLUE SHIELD | Admitting: Family Medicine

## 2016-08-16 NOTE — L&D Delivery Note (Signed)
Spontaneous vaginal birth of live born female infant at 12. Infant immediately to maternal abdomen. Cord clamped, three (3) vessels, no cord blood collected. Receiving nurse present at bedside. Receiving nurse present a bedside for birth.   Spontaneous delivery placenta at 1243. Intact perineum. QBL: pending.   Mom to postpartum.  Baby to Couplet care / Skin to Skin. Pitocin infusing. Initiate routine postpartum care and orders. FOB and oldest daughter present at bedside for birth.    Gunnar Bulla, CNM 04/06/2017, 1:07 PM

## 2016-09-09 ENCOUNTER — Encounter: Payer: Self-pay | Admitting: Certified Nurse Midwife

## 2016-09-09 ENCOUNTER — Ambulatory Visit (INDEPENDENT_AMBULATORY_CARE_PROVIDER_SITE_OTHER): Payer: BLUE CROSS/BLUE SHIELD | Admitting: Certified Nurse Midwife

## 2016-09-09 ENCOUNTER — Other Ambulatory Visit (INDEPENDENT_AMBULATORY_CARE_PROVIDER_SITE_OTHER): Payer: BLUE CROSS/BLUE SHIELD

## 2016-09-09 VITALS — BP 127/90 | HR 89 | Ht 64.0 in | Wt 249.9 lb

## 2016-09-09 DIAGNOSIS — Z3201 Encounter for pregnancy test, result positive: Secondary | ICD-10-CM | POA: Diagnosis not present

## 2016-09-09 DIAGNOSIS — N926 Irregular menstruation, unspecified: Secondary | ICD-10-CM | POA: Diagnosis not present

## 2016-09-09 LAB — POCT URINE PREGNANCY: Preg Test, Ur: POSITIVE — AB

## 2016-09-09 NOTE — Progress Notes (Signed)
GYN ENCOUNTER NOTE  Subjective:       Bonnie Armstrong is a 36 y.o. [phone] female is here for gynecologic evaluation of the following issues: missed menses and positive home pregnancy test.   Previously a patient of [facility].   She had a positive home pregnancy test around Christmas 2017.   Chrissy endorses breast tenderness. Reports brownish pink spotting with wiping after intercourse.   Denies difficulty breathing or respiratory distress, chest pain, abdominal pain, and leg pain or swelling.   Gynecologic History Patient's last menstrual period was 06/30/2016 (approximate).   EDD: 04/06/2017  Gestational age: 69 weeks 1 day  Contraception: none   Last Pap: 04/15/2016. Results were: normal  Obstetric History OB History  Gravida Para Term Preterm AB Living  7 5 4 1 1 5   SAB TAB Ectopic Multiple Live Births  1       5    # Outcome Date GA Lbr Len/2nd Weight Sex Delivery Anes PTL Lv  7 Current           6 Term 12/15/15 [redacted]w[redacted]d  7 lb 6 oz (3.345 kg) M Vag-Spont   LIV  5 Term 12/19/07 [redacted]w[redacted]d  8 lb 6 oz (3.799 kg) M Vag-Spont   LIV  4 SAB 2007          3 Term 02/20/05 [redacted]w[redacted]d  7 lb 15 oz (3.6 kg) M Vag-Spont   LIV  2 Preterm 04/05/02 [redacted]w[redacted]d  3 lb (1.361 kg) M Vag-Spont   LIV  1 Term 05/23/01 [redacted]w[redacted]d  6 lb 15 oz (3.147 kg) F CS-LTranv   LIV     Birth Comments: Baby stuck in birth cannal, had STAT c/s       Past Medical History:  Diagnosis Date  . IFG (impaired fasting glucose)   . Morbid obesity ([facility]) 02/28/2015  . Prediabetes     Past Surgical History:  Procedure Laterality Date  . CESAREAN SECTION  2002  . WISDOM TOOTH EXTRACTION      No current outpatient prescriptions on file prior to visit.   No current facility-administered medications on file prior to visit.     No Known Allergies  Social History   Social History  . Marital status: Married    Spouse name: N/A  . Number of children: N/A  . Years of education: N/A   Occupational History  . Not  on file.   Social History Main Topics  . Smoking status: Never Smoker  . Smokeless tobacco: Never Used  . Alcohol use No  . Drug use: No  . Sexual activity: Yes    Birth control/ protection: None   Other Topics Concern  . Not on file   Social History Narrative  . No narrative on file    Family History  Problem Relation Age of Onset  . Hypertension Mother   . Hyperlipidemia Mother   . Diabetes Maternal Grandfather   . COPD Paternal Grandmother   . Cancer Paternal Grandfather     colon    The following portions of the patient's history were reviewed and updated as appropriate: allergies, current medications, past family history, past medical history, past social history, past surgical history and problem list.  Review of Systems  Review of Systems - Negative except for as noted above History obtained from the patient   Objective:   BP 127/90 (BP Location: Left Arm, Patient Position: Sitting, Cuff Size: Large)   Pulse 89   Ht 5\' 4"  (1.626 m)  Wt 249 lb 14.4 oz (113.4 kg)   LMP 06/30/2016 (Approximate)   BMI 42.90 kg/m   Alert and oriented x 4  Positive urine pregnancy test  Physical exam: not indicated  Assessment:   1. Pregnancy test positive  - POCT urine pregnancy  2. Missed menses  - US OB Transvaginal  Plan:   1. Viability and dating US today  2. RTC x 2 weeks for NOB  3. Reviewed red flag symptoms and when to call.    Gunnar BullaJenkins Michelle Therma Lasure, CNM

## 2016-09-09 NOTE — Patient Instructions (Signed)
First Trimester of Pregnancy  The first trimester of pregnancy is from week 1 until the end of week 12 (months 1 through 3). A week after a sperm fertilizes an egg, the egg will implant on the wall of the uterus. This embryo will begin to develop into a baby. Genes from you and your partner are forming the baby. The female genes determine whether the baby is a boy or a girl. At 6-8 weeks, the eyes and face are formed, and the heartbeat can be seen on ultrasound. At the end of 12 weeks, all the baby's organs are formed.   Now that you are pregnant, you will want to do everything you can to have a healthy baby. Two of the most important things are to get good prenatal care and to follow your health care provider's instructions. Prenatal care is all the medical care you receive before the baby's birth. This care will help prevent, find, and treat any problems during the pregnancy and childbirth.  BODY CHANGES  Your body goes through many changes during pregnancy. The changes vary from woman to woman.   · You may gain or lose a couple of pounds at first.  · You may feel sick to your stomach (nauseous) and throw up (vomit). If the vomiting is uncontrollable, call your health care provider.  · You may tire easily.  · You may develop headaches that can be relieved by medicines approved by your health care provider.  · You may urinate more often. Painful urination may mean you have a bladder infection.  · You may develop heartburn as a result of your pregnancy.  · You may develop constipation because certain hormones are causing the muscles that push waste through your intestines to slow down.  · You may develop hemorrhoids or swollen, bulging veins (varicose veins).  · Your breasts may begin to grow larger and become tender. Your nipples may stick out more, and the tissue that surrounds them (areola) may become darker.  · Your gums may bleed and may be sensitive to brushing and flossing.   · Dark spots or blotches (chloasma, mask of pregnancy) may develop on your face. This will likely fade after the baby is born.  · Your menstrual periods will stop.  · You may have a loss of appetite.  · You may develop cravings for certain kinds of food.  · You may have changes in your emotions from day to day, such as being excited to be pregnant or being concerned that something may go wrong with the pregnancy and baby.  · You may have more vivid and strange dreams.  · You may have changes in your hair. These can include thickening of your hair, rapid growth, and changes in texture. Some women also have hair loss during or after pregnancy, or hair that feels dry or thin. Your hair will most likely return to normal after your baby is born.  WHAT TO EXPECT AT YOUR PRENATAL VISITS  During a routine prenatal visit:  · You will be weighed to make sure you and the baby are growing normally.  · Your blood pressure will be taken.  · Your abdomen will be measured to track your baby's growth.  · The fetal heartbeat will be listened to starting around week 10 or 12 of your pregnancy.  · Test results from any previous visits will be discussed.  Your health care provider may ask you:  · How you are feeling.  · If you   are feeling the baby move.  · If you have had any abnormal symptoms, such as leaking fluid, bleeding, severe headaches, or abdominal cramping.  · If you are using any tobacco products, including cigarettes, chewing tobacco, and electronic cigarettes.  · If you have any questions.  Other tests that may be performed during your first trimester include:  · Blood tests to find your blood type and to check for the presence of any previous infections. They will also be used to check for low iron levels (anemia) and Rh antibodies. Later in the pregnancy, blood tests for diabetes will be done along with other tests if problems develop.  · Urine tests to check for infections, diabetes, or protein in the urine.   · An ultrasound to confirm the proper growth and development of the baby.  · An amniocentesis to check for possible genetic problems.  · Fetal screens for spina bifida and Down syndrome.  · You may need other tests to make sure you and the baby are doing well.  · HIV (human immunodeficiency virus) testing. Routine prenatal testing includes screening for HIV, unless you choose not to have this test.  HOME CARE INSTRUCTIONS   Medicines  · Follow your health care provider's instructions regarding medicine use. Specific medicines may be either safe or unsafe to take during pregnancy.  · Take your prenatal vitamins as directed.  · If you develop constipation, try taking a stool softener if your health care provider approves.  Diet  · Eat regular, well-balanced meals. Choose a variety of foods, such as meat or vegetable-based protein, fish, milk and low-fat dairy products, vegetables, fruits, and whole grain breads and cereals. Your health care provider will help you determine the amount of weight gain that is right for you.  · Avoid raw meat and uncooked cheese. These carry germs that can cause birth defects in the baby.  · Eating four or five small meals rather than three large meals a day may help relieve nausea and vomiting. If you start to feel nauseous, eating a few soda crackers can be helpful. Drinking liquids between meals instead of during meals also seems to help nausea and vomiting.  · If you develop constipation, eat more high-fiber foods, such as fresh vegetables or fruit and whole grains. Drink enough fluids to keep your urine clear or pale yellow.  Activity and Exercise  · Exercise only as directed by your health care provider. Exercising will help you:    Control your weight.    Stay in shape.    Be prepared for labor and delivery.  · Experiencing pain or cramping in the lower abdomen or low back is a good sign that you should stop exercising. Check with your health care provider  before continuing normal exercises.  · Try to avoid standing for long periods of time. Move your legs often if you must stand in one place for a long time.  · Avoid heavy lifting.  · Wear low-heeled shoes, and practice good posture.  · You may continue to have sex unless your health care provider directs you otherwise.  Relief of Pain or Discomfort  · Wear a good support bra for breast tenderness.    · Take warm sitz baths to soothe any pain or discomfort caused by hemorrhoids. Use hemorrhoid cream if your health care provider approves.    · Rest with your legs elevated if you have leg cramps or low back pain.  · If you develop varicose veins in your   legs, wear support hose. Elevate your feet for 15 minutes, 3-4 times a day. Limit salt in your diet.  Prenatal Care  · Schedule your prenatal visits by the twelfth week of pregnancy. They are usually scheduled monthly at first, then more often in the last 2 months before delivery.  · Write down your questions. Take them to your prenatal visits.  · Keep all your prenatal visits as directed by your health care provider.  Safety  · Wear your seat belt at all times when driving.  · Make a list of emergency phone numbers, including numbers for family, friends, the hospital, and police and fire departments.  General Tips  · Ask your health care provider for a referral to a local prenatal education class. Begin classes no later than at the beginning of month 6 of your pregnancy.  · Ask for help if you have counseling or nutritional needs during pregnancy. Your health care provider can offer advice or refer you to specialists for help with various needs.  · Do not use hot tubs, steam rooms, or saunas.  · Do not douche or use tampons or scented sanitary pads.  · Do not cross your legs for long periods of time.  · Avoid cat litter boxes and soil used by cats. These carry germs that can cause birth defects in the baby and possibly loss of the fetus by miscarriage or stillbirth.   · Avoid all smoking, herbs, alcohol, and medicines not prescribed by your health care provider. Chemicals in these affect the formation and growth of the baby.  · Do not use any tobacco products, including cigarettes, chewing tobacco, and electronic cigarettes. If you need help quitting, ask your health care provider. You may receive counseling support and other resources to help you quit.  · Schedule a dentist appointment. At home, brush your teeth with a soft toothbrush and be gentle when you floss.  SEEK MEDICAL CARE IF:   · You have dizziness.  · You have mild pelvic cramps, pelvic pressure, or nagging pain in the abdominal area.  · You have persistent nausea, vomiting, or diarrhea.  · You have a bad smelling vaginal discharge.  · You have pain with urination.  · You notice increased swelling in your face, hands, legs, or ankles.  SEEK IMMEDIATE MEDICAL CARE IF:   · You have a fever.  · You are leaking fluid from your vagina.  · You have spotting or bleeding from your vagina.  · You have severe abdominal cramping or pain.  · You have rapid weight gain or loss.  · You vomit blood or material that looks like coffee grounds.  · You are exposed to German measles and have never had them.  · You are exposed to fifth disease or chickenpox.  · You develop a severe headache.  · You have shortness of breath.  · You have any kind of trauma, such as from a fall or a car accident.     This information is not intended to replace advice given to you by your health care provider. Make sure you discuss any questions you have with your health care provider.     Document Released: 07/27/2001 Document Revised: 08/23/2014 Document Reviewed: 06/12/2013  [employer] Interactive Patient Education ©2017 [employer]

## 2016-09-24 ENCOUNTER — Ambulatory Visit (INDEPENDENT_AMBULATORY_CARE_PROVIDER_SITE_OTHER): Payer: BLUE CROSS/BLUE SHIELD | Admitting: Certified Nurse Midwife

## 2016-09-24 VITALS — BP 136/85 | HR 91 | Ht 64.0 in | Wt 250.6 lb

## 2016-09-24 DIAGNOSIS — Z1389 Encounter for screening for other disorder: Secondary | ICD-10-CM

## 2016-09-24 DIAGNOSIS — Z113 Encounter for screening for infections with a predominantly sexual mode of transmission: Secondary | ICD-10-CM

## 2016-09-24 DIAGNOSIS — Z3481 Encounter for supervision of other normal pregnancy, first trimester: Secondary | ICD-10-CM

## 2016-09-24 NOTE — Progress Notes (Signed)
Bonnie Armstrong presents for NOB nurse interview visit. Pregnancy confirmation done on 09/09/2016 by [facility], positive UPT.  G- 7.  [id number]. Ultrasound done 09/09/2016 with EGA 10.4 wks.  Pregnancy education material explained and given.  No cats in the home. NOB labs ordered. TSH/HbgA1c due to Increased BMI, HIV labs and Drug screen were explained optional and she did not decline. Drug screen ordered. PNV encouraged. Genetic screening declined.  Pt. To follow up with provider in _1_ weeks for NOB physical.  All questions answered.

## 2016-09-24 NOTE — Patient Instructions (Signed)
Pregnancy and Zika Virus Disease Introduction Zika virus disease, or Zika, is an illness that can spread to people from mosquitoes that carry the virus. It may also spread from person to person through infected body fluids. Zika first occurred in [place], but recently it has spread to new areas. The virus occurs in tropical climates. The location of Zika continues to change. Most people who become infected with Zika virus do not develop serious illness. However, Zika may cause birth defects in an unborn baby whose mother is infected with the virus. It may also increase the risk of miscarriage. What are the symptoms of Zika virus disease? In many cases, people who have been infected with Zika virus do not develop any symptoms. If symptoms appear, they usually start about a week after the person is infected. Symptoms are usually mild. They may include:  Fever.  Rash.  Red eyes.  Joint pain. How does Zika virus disease spread? The main way that Zika virus spreads is through the bite of a certain type of mosquito. Unlike most types of mosquitos, which bite only at night, the type of mosquito that carries Zika virus bites both at night and during the day. Zika virus can also spread through sexual contact, through a blood transfusion, and from a mother to her baby before or during birth. Once you have had Zika virus disease, it is unlikely that you will get it again. Can I pass Zika to my baby during pregnancy? Yes, Zika can pass from a mother to her baby before or during birth. What problems can Zika cause for my baby? A woman who is infected with Zika virus while pregnant is at risk of having her baby born with a condition in which the brain or head is smaller than expected (microcephaly). Babies who have microcephaly can have developmental delays, seizures, hearing problems, and vision problems. Having Zika virus disease during pregnancy can also increase the risk of miscarriage. How can Zika  virus disease be prevented? There is no vaccine to prevent Zika. The best way to prevent the disease is to avoid infected mosquitoes and avoid exposure to body fluids that can spread the virus. Avoid any possible exposure to Zika by taking the following precautions. For women and their sex partners:  Avoid traveling to high-risk areas. The locations where Zika is being reported change often. To identify high-risk areas, check the CDC travel website: www.cdc.gov/zika/geo/index.html  If you or your sex partner must travel to a high-risk area, talk with a health care provider before and after traveling.  Take all precautions to avoid mosquito bites if you live in, or travel to, any of the high-risk areas. Insect repellents are safe to use during pregnancy.  Ask your health care provider when it is safe to have sexual contact. For women:  If you are pregnant or trying to become pregnant, avoid sexual contact with persons who may have been exposed to Zika virus, persons who have possible symptoms of Zika, or persons whose history you are unsure about. If you choose to have sexual contact with someone who may have been exposed to Zika virus, use condoms correctly during the entire duration of sexual activity, every time. Do not share sexual devices, as you may be exposed to body fluids.  Ask your health care provider about when it is safe to attempt pregnancy after a possible exposure to Zika virus. What steps should I take to avoid mosquito bites? Take these steps to avoid mosquito bites when you   are in a high-risk area:  Wear loose clothing that covers your arms and legs.  Limit your outdoor activities.  Do not open windows unless they have window screens.  Sleep under mosquito nets.  Use insect repellent. The best insect repellents have:  DEET, picaridin, oil of lemon eucalyptus (OLE), or IR3535 in them.  Higher amounts of an active ingredient in them.  Remember that insect repellents  are safe to use during pregnancy.  Do not use OLE on children who are younger than 3 years of age. Do not use insect repellent on babies who are younger than 2 months of age.  Cover your child's stroller with mosquito netting. Make sure the netting fits snugly and that any loose netting does not cover your child's mouth or nose. Do not use a blanket as a mosquito-protection cover.  Do not apply insect repellent underneath clothing.  If you are using sunscreen, apply the sunscreen before applying the insect repellent.  Treat clothing with permethrin. Do not apply permethrin directly to your skin. Follow label directions for safe use.  Get rid of standing water, where mosquitoes may reproduce. Standing water is often found in items such as buckets, bowls, animal food dishes, and flowerpots. When you return from traveling to any high-risk area, continue taking actions to protect yourself against mosquito bites for 3 weeks, even if you show no signs of illness. This will prevent spreading Zika virus to uninfected mosquitoes. What should I know about the sexual transmission of Zika? People can spread Zika to their sexual partners during vaginal, anal, or oral sex, or by sharing sexual devices. Many people with Zika do not develop symptoms, so a person could spread the disease without knowing that they are infected. The greatest risk is to women who are pregnant or who may become pregnant. Zika virus can live longer in semen than it can live in blood. Couples can prevent sexual transmission of the virus by:  Using condoms correctly during the entire duration of sexual activity, every time. This includes vaginal, anal, and oral sex.  Not sharing sexual devices. Sharing increases your risk of being exposed to body fluid from another person.  Avoiding all sexual activity until your health care provider says it is safe. Should I be tested for Zika virus? A sample of your blood can be tested for Zika  virus. A pregnant woman should be tested if she may have been exposed to the virus or if she has symptoms of Zika. She may also have additional tests done during her pregnancy, such ultrasound testing. Talk with your health care provider about which tests are recommended. This information is not intended to replace advice given to you by your health care provider. Make sure you discuss any questions you have with your health care provider. Document Released: 04/23/2015 Document Revised: 01/08/2016 Document Reviewed: 04/16/2015  2017 Elsevier Minor Illnesses and Medications in Pregnancy  Cold/Flu:  Sudafed for congestion- Robitussin (plain) for cough- Tylenol for discomfort.  Please follow the directions on the label.  Try not to take any more than needed.  OTC Saline nasal spray and air humidifier or cool-mist  Vaporizer to sooth nasal irritation and to loosen congestion.  It is also important to increase intake of non carbonated fluids, especially if you have a fever.  Constipation:  Colace-2 capsules at bedtime; Metamucil- follow directions on label; Senokot- 1 tablet at bedtime.  Any one of these medications can be used.  It is also very important to increase   fluids and fruits along with regular exercise.  If problem persists please call the office.  Diarrhea:  Kaopectate as directed on the label.  Eat a bland diet and increase fluids.  Avoid highly seasoned foods.  Headache:  Tylenol 1 or 2 tablets every 3-4 hours as needed  Indigestion:  Maalox, Mylanta, Tums or Rolaids- as directed on label.  Also try to eat small meals and avoid fatty, greasy or spicy foods.  Nausea with or without Vomiting:  Nausea in pregnancy is caused by increased levels of hormones in the body which influence the digestive system and cause irritation when stomach acids accumulate.  Symptoms usually subside after 1st trimester of pregnancy.  Try the following: 1. Keep saltines, graham crackers or dry toast by your bed to  eat upon awakening. 2. Don't let your stomach get empty.  Try to eat 5-6 small meals per day instead of 3 large ones. 3. Avoid greasy fatty or highly seasoned foods.  4. Take OTC Unisom 1 tablet at bed time along with OTC Vitamin B6 25-50 mg 3 times per day.    If nausea continues with vomiting and you are unable to keep down food and fluids you may need a prescription medication.  Please notify your provider.   Sore throat:  Chloraseptic spray, throat lozenges and or plain Tylenol.  Vaginal Yeast Infection:  OTC Monistat for 7 days as directed on label.  If symptoms do not resolve within a week notify provider.  If any of the above problems do not subside with recommended treatment please call the office for further assistance.   Do not take Aspirin, Advil, Motrin or Ibuprofen.  * * OTC= Over the counter Hyperemesis Gravidarum Hyperemesis gravidarum is a severe form of nausea and vomiting that happens during pregnancy. Hyperemesis is worse than morning sickness. It may cause you to have nausea or vomiting all day for many days. It may keep you from eating and drinking enough food and liquids. Hyperemesis usually occurs during the first half (the first 20 weeks) of pregnancy. It often goes away once a woman is in her second half of pregnancy. However, sometimes hyperemesis continues through an entire pregnancy. What are the causes? The cause of this condition is not known. It may be related to changes in chemicals (hormones) in the body during pregnancy, such as the high level of pregnancy hormone (human chorionic gonadotropin) or the increase in the female sex hormone (estrogen). What are the signs or symptoms? Symptoms of this condition include:  Severe nausea and vomiting.  Nausea that does not go away.  Vomiting that does not allow you to keep any food down.  Weight loss.  Body fluid loss (dehydration).  Having no desire to eat, or not liking food that you have previously  enjoyed. How is this diagnosed? This condition may be diagnosed based on:  A physical exam.  Your medical history.  Your symptoms.  Blood tests.  Urine tests. How is this treated? This condition may be managed with medicine. If medicines to do not help relieve nausea and vomiting, you may need to receive fluids through an IV tube at the hospital. Follow these instructions at home:  Take over-the-counter and prescription medicines only as told by your health care provider.  Avoid iron pills and multivitamins that contain iron for the first 3-4 months of pregnancy. If you take prescription iron pills, do not stop taking them unless your health care provider approves.  Take the following actions to help   prevent nausea and vomiting:  In the morning, before getting out of bed, try eating a couple of dry crackers or a piece of toast.  Avoid foods and smells that upset your stomach. Fatty and spicy foods may make nausea worse.  Eat 5-6 small meals a day.  Do not drink fluids while eating meals. Drink between meals.  Eat or suck on things that have ginger in them. Ginger can help relieve nausea.  Avoid food preparation. The smell of food can spoil your appetite or trigger nausea.  Follow instructions from your health care provider about eating or drinking restrictions.  For snacks, eat high-protein foods, such as cheese.  Keep all follow-up and pre-birth (prenatal) visits as told by your health care provider. This is important. Contact a health care provider if:  You have pain in your abdomen.  You have a severe headache.  You have vision problems.  You are losing weight. Get help right away if:  You cannot drink fluids without vomiting.  You vomit blood.  You have constant nausea and vomiting.  You are very weak.  You are very thirsty.  You feel dizzy.  You faint.  You have a fever or other symptoms that last for more than 2-3 days.  You have a fever and  your symptoms suddenly get worse. Summary  Hyperemesis gravidarum is a severe form of nausea and vomiting that happens during pregnancy.  Making some changes to your eating habits may help relieve nausea and vomiting.  This condition may be managed with medicine.  If medicines to do not help relieve nausea and vomiting, you may need to receive fluids through an IV tube at the hospital. This information is not intended to replace advice given to you by your health care provider. Make sure you discuss any questions you have with your health care provider. Document Released: 08/02/2005 Document Revised: 03/31/2016 Document Reviewed: 03/31/2016 Elsevier Interactive Patient Education  2017 [employer] Commonly Asked Questions During Pregnancy  Cats: A parasite can be excreted in cat feces.  To avoid exposure you need to have another person empty the little box.  If you must empty the litter box you will need to wear gloves.  Wash your hands after handling your cat.  This parasite can also be found in raw or undercooked meat so this should also be avoided.  Colds, Sore Throats, Flu: Please check your medication sheet to see what you can take for symptoms.  If your symptoms are unrelieved by these medications please call the office.  Dental Work: Most any dental work your dentist recommends is permitted.  X-rays should only be taken during the first trimester if absolutely necessary.  Your abdomen should be shielded with a lead apron during all x-rays.  Please notify your provider prior to receiving any x-rays.  Novocaine is fine; gas is not recommended.  If your dentist requires a note from us prior to dental work please call the office and we will provide one for you.  Exercise: Exercise is an important part of staying healthy during your pregnancy.  You may continue most exercises you were accustomed to prior to pregnancy.  Later in your pregnancy you will most likely notice you have difficulty  with activities requiring balance like riding a bicycle.  It is important that you listen to your body and avoid activities that put you at a higher risk of falling.  Adequate rest and staying well hydrated are a must!  If you have questions   about the safety of specific activities ask your provider.    Exposure to Children with illness: Try to avoid obvious exposure; report any symptoms to us when noted,  If you have chicken pos, red measles or mumps, you should be immune to these diseases.   Please do not take any vaccines while pregnant unless you have checked with your OB provider.  Fetal Movement: After 28 weeks we recommend you do "kick counts" twice daily.  Lie or sit down in a calm quiet environment and count your baby movements "kicks".  You should feel your baby at least 10 times per hour.  If you have not felt 10 kicks within the first hour get up, walk around and have something sweet to eat or drink then repeat for an additional hour.  If count remains less than 10 per hour notify your provider.  Fumigating: Follow your pest control agent's advice as to how long to stay out of your home.  Ventilate the area well before re-entering.  Hemorrhoids:   Most over-the-counter preparations can be used during pregnancy.  Check your medication to see what is safe to use.  It is important to use a stool softener or fiber in your diet and to drink lots of liquids.  If hemorrhoids seem to be getting worse please call the office.   Hot Tubs:  Hot tubs Jacuzzis and saunas are not recommended while pregnant.  These increase your internal body temperature and should be avoided.  Intercourse:  Sexual intercourse is safe during pregnancy as long as you are comfortable, unless otherwise advised by your provider.  Spotting may occur after intercourse; report any bright red bleeding that is heavier than spotting.  Labor:  If you know that you are in labor, please go to the hospital.  If you are unsure, please  call the office and let us help you decide what to do.  Lifting, straining, etc:  If your job requires heavy lifting or straining please check with your provider for any limitations.  Generally, you should not lift items heavier than that you can lift simply with your hands and arms (no back muscles)  Painting:  Paint fumes do not harm your pregnancy, but may make you ill and should be avoided if possible.  Latex or water based paints have less odor than oils.  Use adequate ventilation while painting.  Permanents & Hair Color:  Chemicals in hair dyes are not recommended as they cause increase hair dryness which can increase hair loss during pregnancy.  " Highlighting" and permanents are allowed.  Dye may be absorbed differently and permanents may not hold as well during pregnancy.  Sunbathing:  Use a sunscreen, as skin burns easily during pregnancy.  Drink plenty of fluids; avoid over heating.  Tanning Beds:  Because their possible side effects are still unknown, tanning beds are not recommended.  Ultrasound Scans:  Routine ultrasounds are performed at approximately 20 weeks.  You will be able to see your baby's general anatomy an if you would like to know the gender this can usually be determined as well.  If it is questionable when you conceived you may also receive an ultrasound early in your pregnancy for dating purposes.  Otherwise ultrasound exams are not routinely performed unless there is a medical necessity.  Although you can request a scan we ask that you pay for it when conducted because insurance does not cover " patient request" scans.  Work: If your pregnancy proceeds without complications you   may work until your due date, unless your physician or employer advises otherwise.  Round Ligament Pain/Pelvic Discomfort:  Sharp, shooting pains not associated with bleeding are fairly common, usually occurring in the second trimester of pregnancy.  They tend to be worse when standing up or when  you remain standing for long periods of time.  These are the result of pressure of certain pelvic ligaments called "round ligaments".  Rest, Tylenol and heat seem to be the most effective relief.  As the womb and fetus grow, they rise out of the pelvis and the discomfort improves.  Please notify the office if your pain seems different than that described.  It may represent a more serious condition.   

## 2016-09-25 LAB — HEPATITIS B SURFACE ANTIGEN: Hepatitis B Surface Ag: NEGATIVE

## 2016-09-25 LAB — VARICELLA ZOSTER ANTIBODY, IGG: Varicella zoster IgG: 1230 index (ref >165)

## 2016-09-25 LAB — CBC WITH DIFFERENTIAL/PLATELET
Basophils Absolute: 0 10*3/uL (ref 0.0–0.2)
Basos: 0 %
EOS (ABSOLUTE): 0 10*3/uL (ref 0.0–0.4)
Eos: 1 %
Hematocrit: 37.9 % (ref 34.0–46.6)
Hemoglobin: 12.6 g/dL (ref 11.1–15.9)
Immature Grans (Abs): 0 10*3/uL (ref 0.0–0.1)
Immature Granulocytes: 0 %
Lymphocytes Absolute: 1.7 10*3/uL (ref 0.7–3.1)
Lymphs: 22 %
MCH: 27.7 pg (ref 26.6–33.0)
MCHC: 33.2 g/dL (ref 31.5–35.7)
MCV: 83 fL (ref 79–97)
Monocytes Absolute: 0.4 10*3/uL (ref 0.1–0.9)
Monocytes: 5 %
Neutrophils Absolute: 5.4 10*3/uL (ref 1.4–7.0)
Neutrophils: 72 %
Platelets: 267 10*3/uL (ref 150–379)
RBC: 4.55 x10E6/uL (ref 3.77–5.28)
RDW: 14.5 % (ref 12.3–15.4)
WBC: 7.5 10*3/uL (ref 3.4–10.8)

## 2016-09-25 LAB — ABO

## 2016-09-25 LAB — TSH: TSH: 0.8 u[IU]/mL (ref 0.450–4.500)

## 2016-09-25 LAB — HIV ANTIBODY (ROUTINE TESTING W REFLEX): HIV Screen 4th Generation wRfx: NONREACTIVE

## 2016-09-25 LAB — HEMOGLOBIN A1C
Est. average glucose Bld gHb Est-mCnc: 123 mg/dL
Hgb A1c MFr Bld: 5.9 % — ABNORMAL HIGH (ref 4.8–5.6)

## 2016-09-25 LAB — RH TYPE: Rh Factor: POSITIVE

## 2016-09-25 LAB — RPR: RPR Ser Ql: NONREACTIVE

## 2016-09-25 LAB — ANTIBODY SCREEN: Antibody Screen: NEGATIVE

## 2016-09-26 LAB — URINALYSIS, ROUTINE W REFLEX MICROSCOPIC
Bilirubin, UA: NEGATIVE
Glucose, UA: NEGATIVE
Ketones, UA: NEGATIVE
Nitrite, UA: NEGATIVE
Protein, UA: NEGATIVE
RBC, UA: NEGATIVE
Specific Gravity, UA: 1.026 (ref 1.005–1.030)
Urobilinogen, Ur: 0.2 mg/dL (ref 0.2–1.0)
pH, UA: 6.5 (ref 5.0–7.5)

## 2016-09-26 LAB — MONITOR DRUG PROFILE 14(MW)
Amphetamine Scrn, Ur: NEGATIVE ng/mL
BARBITURATE SCREEN URINE: NEGATIVE ng/mL
BENZODIAZEPINE SCREEN, URINE: NEGATIVE ng/mL
Buprenorphine, Urine: NEGATIVE ng/mL
CANNABINOIDS UR QL SCN: NEGATIVE ng/mL
Cocaine (Metab) Scrn, Ur: NEGATIVE ng/mL
Creatinine(Crt), U: 224.8 mg/dL (ref 20.0–300.0)
Fentanyl, Urine: NEGATIVE pg/mL
Meperidine Screen, Urine: NEGATIVE ng/mL
Methadone Screen, Urine: NEGATIVE ng/mL
OXYCODONE+OXYMORPHONE UR QL SCN: NEGATIVE ng/mL
Opiate Scrn, Ur: NEGATIVE ng/mL
Ph of Urine: 6.4 (ref 4.5–8.9)
Phencyclidine Qn, Ur: NEGATIVE ng/mL
Propoxyphene Scrn, Ur: NEGATIVE ng/mL
SPECIFIC GRAVITY: 1.022
Tramadol Screen, Urine: NEGATIVE ng/mL

## 2016-09-26 LAB — MICROSCOPIC EXAMINATION: Casts: NONE SEEN /lpf

## 2016-09-26 LAB — NICOTINE SCREEN, URINE: Cotinine Ql Scrn, Ur: NEGATIVE ng/mL

## 2016-09-27 ENCOUNTER — Encounter: Payer: BLUE CROSS/BLUE SHIELD | Admitting: Certified Nurse Midwife

## 2016-09-27 LAB — GC/CHLAMYDIA PROBE AMP
Chlamydia trachomatis, NAA: NEGATIVE
Neisseria gonorrhoeae by PCR: NEGATIVE

## 2016-09-28 ENCOUNTER — Ambulatory Visit (INDEPENDENT_AMBULATORY_CARE_PROVIDER_SITE_OTHER): Payer: BLUE CROSS/BLUE SHIELD | Admitting: Certified Nurse Midwife

## 2016-09-28 ENCOUNTER — Encounter: Payer: Self-pay | Admitting: Certified Nurse Midwife

## 2016-09-28 VITALS — BP 127/82 | HR 95 | Wt 249.5 lb

## 2016-09-28 DIAGNOSIS — Z1389 Encounter for screening for other disorder: Secondary | ICD-10-CM

## 2016-09-28 DIAGNOSIS — Z3481 Encounter for supervision of other normal pregnancy, first trimester: Secondary | ICD-10-CM

## 2016-09-28 LAB — POCT URINALYSIS DIPSTICK
Bilirubin, UA: NEGATIVE
Blood, UA: NEGATIVE
Glucose, UA: NEGATIVE
Ketones, UA: NEGATIVE
Leukocytes, UA: NEGATIVE
Nitrite, UA: NEGATIVE
Protein, UA: NEGATIVE
Spec Grav, UA: 1.01
Urobilinogen, UA: NEGATIVE
pH, UA: 6.5

## 2016-09-28 NOTE — Progress Notes (Signed)
No complaints

## 2016-09-28 NOTE — Patient Instructions (Signed)
Second Trimester of Pregnancy The second trimester is from week 13 through week 28, month 4 through 6. This is often the time in pregnancy that you feel your best. Often times, morning sickness has lessened or quit. You may have more energy, and you may get hungry more often. Your unborn baby (fetus) is growing rapidly. At the end of the sixth month, he or she is about 9 inches long and weighs about 1 pounds. You will likely feel the baby move (quickening) between 18 and 20 weeks of pregnancy. Follow these instructions at home:  Avoid all smoking, herbs, and alcohol. Avoid drugs not approved by your doctor.  Do not use any tobacco products, including cigarettes, chewing tobacco, and electronic cigarettes. If you need help quitting, ask your doctor. You may get counseling or other support to help you quit.  Only take medicine as told by your doctor. Some medicines are safe and some are not during pregnancy.  Exercise only as told by your doctor. Stop exercising if you start having cramps.  Eat regular, healthy meals.  Wear a good support bra if your breasts are tender.  Do not use hot tubs, steam rooms, or saunas.  Wear your seat belt when driving.  Avoid raw meat, uncooked cheese, and liter boxes and soil used by cats.  Take your prenatal vitamins.  Take 1500-2000 milligrams of calcium daily starting at the 20th week of pregnancy until you deliver your baby.  Try taking medicine that helps you poop (stool softener) as needed, and if your doctor approves. Eat more fiber by eating fresh fruit, vegetables, and whole grains. Drink enough fluids to keep your pee (urine) clear or pale yellow.  Take warm water baths (sitz baths) to soothe pain or discomfort caused by hemorrhoids. Use hemorrhoid cream if your doctor approves.  If you have puffy, bulging veins (varicose veins), wear support hose. Raise (elevate) your feet for 15 minutes, 3-4 times a day. Limit salt in your diet.  Avoid heavy  lifting, wear low heals, and sit up straight.  Rest with your legs raised if you have leg cramps or low back pain.  Visit your dentist if you have not gone during your pregnancy. Use a soft toothbrush to brush your teeth. Be gentle when you floss.  You can have sex (intercourse) unless your doctor tells you not to.  Go to your doctor visits. Get help if:  You feel dizzy.  You have mild cramps or pressure in your lower belly (abdomen).  You have a nagging pain in your belly area.  You continue to feel sick to your stomach (nauseous), throw up (vomit), or have watery poop (diarrhea).  You have bad smelling fluid coming from your vagina.  You have pain with peeing (urination). Get help right away if:  You have a fever.  You are leaking fluid from your vagina.  You have spotting or bleeding from your vagina.  You have severe belly cramping or pain.  You lose or gain weight rapidly.  You have trouble catching your breath and have chest pain.  You notice sudden or extreme puffiness (swelling) of your face, hands, ankles, feet, or legs.  You have not felt the baby move in over an hour.  You have severe headaches that do not go away with medicine.  You have vision changes. This information is not intended to replace advice given to you by your health care provider. Make sure you discuss any questions you have with your health care   provider. Document Released: 10/27/2009 Document Revised: 01/08/2016 Document Reviewed: 10/03/2012 Else[employer] Interactive Patient Education  2017 [employer]  

## 2016-09-28 NOTE — Progress Notes (Signed)
NEW OB HISTORY AND PHYSICAL  SUBJECTIVE:       Bonnie Armstrong is a 36 y.o. [id number] female, Patient's last menstrual period was 06/30/2016 (approximate)., Estimated Date of Delivery: 04/06/17, 105w6d, presents today for establishment of Prenatal Care. Previously a patient of [facility].   She reports breast tenderness that is resolving. Denies difficulty breathing or respiratory distress, chest pain, abdominal pain, vaginal bleeding, leg pain or swelling.   History significant for previous c-section, successful VBAC x 4, and preterm delivery at 30 weeks. Declines 17P injections this pregnancy.    Gynecologic History  Patient's last menstrual period was 06/30/2016 (approximate).   Last Pap: 04/15/2016. Results were: normal  Obstetric History OB History  Gravida Para Term Preterm AB Living  7 5 4 1 1 5   SAB TAB Ectopic Multiple Live Births  1       5    # Outcome Date GA Lbr Len/2nd Weight Sex Delivery Anes PTL Lv  7 Current           6 Term 12/15/15 [redacted]w[redacted]d  7 lb 6 oz (3.345 kg) M Vag-Spont   LIV  5 Term 12/19/07 [redacted]w[redacted]d  8 lb 6 oz (3.799 kg) M Vag-Spont   LIV  4 SAB 2007          3 Term 02/20/05 [redacted]w[redacted]d  7 lb 15 oz (3.6 kg) M Vag-Spont   LIV  2 Preterm 04/05/02 [redacted]w[redacted]d  3 lb (1.361 kg) M Vag-Spont   LIV  1 Term 05/23/01 [redacted]w[redacted]d  6 lb 15 oz (3.147 kg) F CS-LTranv   LIV     Birth Comments: Baby stuck in birth cannal, had STAT c/s       Past Medical History:  Diagnosis Date  . IFG (impaired fasting glucose)   . Morbid obesity (HCC) 02/28/2015  . Prediabetes     Past Surgical History:  Procedure Laterality Date  . CESAREAN SECTION  2002  . WISDOM TOOTH EXTRACTION      Current Outpatient Prescriptions on File Prior to Visit  Medication Sig Dispense Refill  . Prenatal Vit-Fe Fumarate-FA (PRENATAL MULTIVITAMIN) TABS tablet Take 1 tablet by mouth daily at 12 noon.     No current facility-administered medications on file prior to visit.     No Known Allergies  Social History    Social History  . Marital status: Married    Spouse name: N/A  . Number of children: N/A  . Years of education: N/A   Occupational History  . Not on file.   Social History Main Topics  . Smoking status: Never Smoker  . Smokeless tobacco: Never Used  . Alcohol use No  . Drug use: No  . Sexual activity: Yes    Birth control/ protection: None   Other Topics Concern  . Not on file   Social History Narrative  . No narrative on file    Family History  Problem Relation Age of Onset  . Hypertension Mother   . Hyperlipidemia Mother   . Diabetes Maternal Grandfather   . COPD Paternal Grandmother   . Cancer Paternal Grandfather     colon    The following portions of the patient's history were reviewed and updated as appropriate: allergies, current medications, past OB history, past medical history, past surgical history, past family history, past social history, and problem list.    OBJECTIVE: Initial Physical Exam (New OB)  GENERAL APPEARANCE: alert, well appearing, in no apparent distress  HEAD: normocephalic, atraumatic  MOUTH: mucous  membranes moist, pharynx normal without lesions and dental hygiene good  THYROID: no thyromegaly or masses present  BREASTS: no masses noted, no significant tenderness, no palpable axillary nodes, no skin changes  LUNGS: clear to auscultation, no wheezes, rales or rhonchi, symmetric air entry  HEART: regular rate and rhythm, no murmurs  ABDOMEN: soft, nontender, nondistended, no abnormal masses, no epigastric pain, obese and FHT present  EXTREMITIES: no redness or tenderness in the calves or thighs  SKIN: normal coloration and turgor, no rashes  LYMPH NODES: no adenopathy palpable  NEUROLOGIC: alert, oriented, normal speech, no focal findings or movement disorder noted  PELVIC EXAM EXTERNAL GENITALIA: normal appearing vulva with no masses, tenderness or lesions VAGINA: no abnormal discharge or lesions CERVIX: no lesions or  cervical motion tenderness UTERUS: gravid and consistent with 12 weeks ADNEXA: no masses palpable and nontender  ASSESSMENT: Normal pregnancy AMA BMI >40 Previous c-section Previous preterm delivery  PLAN: Prenatal care Declines genetic screening RTC x 4 weeks for ROB and early glucola See orders   Gunnar BullaJenkins Michelle Tiarrah Saville, CNM

## 2016-10-02 LAB — CULTURE, OB URINE

## 2016-10-02 LAB — URINE CULTURE, OB REFLEX: Organism ID, Bacteria: NO GROWTH

## 2016-10-25 ENCOUNTER — Other Ambulatory Visit: Payer: Self-pay | Admitting: Certified Nurse Midwife

## 2016-10-26 ENCOUNTER — Other Ambulatory Visit: Payer: BLUE CROSS/BLUE SHIELD

## 2016-10-26 ENCOUNTER — Encounter: Payer: BLUE CROSS/BLUE SHIELD | Admitting: Certified Nurse Midwife

## 2016-10-26 ENCOUNTER — Ambulatory Visit (INDEPENDENT_AMBULATORY_CARE_PROVIDER_SITE_OTHER): Payer: BLUE CROSS/BLUE SHIELD | Admitting: Certified Nurse Midwife

## 2016-10-26 VITALS — BP 132/86 | HR 101 | Wt 254.0 lb

## 2016-10-26 DIAGNOSIS — Z3482 Encounter for supervision of other normal pregnancy, second trimester: Secondary | ICD-10-CM

## 2016-10-26 DIAGNOSIS — Z1389 Encounter for screening for other disorder: Secondary | ICD-10-CM

## 2016-10-26 DIAGNOSIS — Z3481 Encounter for supervision of other normal pregnancy, first trimester: Secondary | ICD-10-CM

## 2016-10-26 LAB — POCT URINALYSIS DIPSTICK
Bilirubin, UA: NEGATIVE
Blood, UA: NEGATIVE
Glucose, UA: NEGATIVE
Ketones, UA: NEGATIVE
Leukocytes, UA: NEGATIVE
Nitrite, UA: NEGATIVE
Protein, UA: NEGATIVE
Spec Grav, UA: 1.01
Urobilinogen, UA: 0.2
pH, UA: 6.5

## 2016-10-26 NOTE — Progress Notes (Signed)
Early glucola today. Has rash of arms, lower legs, back, also itching.

## 2016-10-26 NOTE — Patient Instructions (Signed)
Skin Conditions During Pregnancy Pregnancy affects many parts of your body. One part is your skin. Most skin problems that develop during pregnancy are not serious and are considered a normal part of pregnancy. They go away on their own after the baby is born. Other skin problems may need treatment. What type of skin problems can develop during pregnancy?  Stretch marks. Stretch marks are purple or pink lines on the skin. They may appear on the belly, breasts, thighs, or buttocks. Stretch marks are caused by weight gain that causes the skin to stretch. Stretch marks do not cause problems. Almost all women get them during pregnancy.  Darkening of the skin (hyperpigmentation). The darkening may occur in patches or as a line. Patches may appear on the face, nipples, or genital area. Lines often stretch from the belly button to the pubic area. Hyperpigmentation develops in almost all pregnant women. It is more severe in women with a dark complexion.  Spider angiomas. These are tiny pink or red lines that go out from a center point, like the legs of a spider. Usually, they are on the face, neck, and arms. They do not cause problems. They are most common in women with light complexions.  Palmar erythema. This is a reddening of the palms. It is most common in women with light complexions.  Swelling and redness. This can occur on the face, eyelids, fingers, or toes.  Pruritic urticarial papules and plaques of pregnancy (PUPPP). This is a rash that is itchy, red, and has tiny blisters. The cause is unknown. It usually starts on the abdomen and may affect the arms or legs. It does not affect the face. It usually begins later in pregnancy. About a third of all pregnant women develop this condition. There are no associated problems to the fetus with this rash. Sometimes, oral steroids are used to calm down the itch. The rash clears after the baby is born.  Prurigo of pregnancy. This is a disease in which red  patches and bumps appear on the arms and legs. The cause is unknown. The patches and bumps clear after the baby is born. About a third of pregnant women develop this disease.  Acne. Pimples may develop, including in women who have had clear skin for a long time.  Skin tags. These are small flaps of skin that stick out from the body. They may grow or become darker during pregnancy. They are usually harmless.  Moles. These are flat or slightly raised growths. They are usually round and pink or brown. They may grow or become darker during pregnancy.  Intrahepatic cholestasis of pregnancy. This is a rare condition that causes itchy skin. It may run in families. It increases the risk of complications for the fetus. This condition usually resolves after delivery. It can recur with subsequent pregnancies.  Impetigo herpetiformis. This is a form of a severe skin disease called pustular psoriasis. Usually, delivery is the only method of resolving the condition.  Pruritic folliculitis of pregnancy. This is a rare condition that causes pimple-like skin growths. It develops in the middle or later stages of pregnancy. Its cause is unknown.It usually resolves 2-3 weeks after delivery.  Pemphigoid gestationis. This is a very rare autoimmune disease. It causes a severely itchy rash and blisters. The rash does not appear on the face, scalp, or inside of the mouth. It usually resolves 3 months after delivery. It may recur with subsequent pregnancies. Some pre-existing skin conditions, such as atopic dermatitis, may become worse   during pregnancy. Follow these instructions at home: Different conditions may have different instructions. In general:  Follow all your health care provider's directions about medicines to treat skin problems while you are pregnant. Do not use any over-the-counter medicines (including medicated creams and lotions) until you have checked with your health care provider. Many medicines are not  safe to use when you are pregnant.  Avoid time in the sun. This will help keep your skin from darkening. When you must be outside, use sunscreen and wear a hat with a wide brim to protect your face. The sunscreen should have a SPF of at least 15. This may help limit dark spots that develop when the skin is exposed to the sun.  To avoid problems from stretched skin:  Do not sit or stand for long periods of time.  Exercise regularly. This helps keep your skin in good condition.  Use a gentle soap. This helps prevent acne.  Do not get too hot or too sweaty. This makes some skin rashes worse.  Wear loose clothes made of a soft fabric. This prevents skin irritation.  For itching, add oatmeal or cornstarch to your bathwater.  Use a skin moisturizer. Ask your health care provider for suggestions. This information is not intended to replace advice given to you by your health care provider. Make sure you discuss any questions you have with your health care provider. Document Released: 09/04/2010 Document Revised: 01/08/2016 Document Reviewed: 05/14/2013 Elsevier Interactive Patient Education  2017 [employer]

## 2016-10-26 NOTE — Progress Notes (Signed)
ROB-Pt doing well. Red flat pin sized dots to arms, lower legs, and back with intermittent itching. Mild relief with lotion. No new medications, foods, or detergents. No other symptoms. Reviewed treatment measures including OTC hydrocortisone to rash BID, benadryl pill or cream, and tepid bath with Aveeno oatmeal bath. Discussed round ligament pain and home treatment measures including abdominal support. Early glucola today. RTC x 4 weeks for anatomy scan and ROB.

## 2016-10-27 LAB — GLUCOSE, 1 HOUR GESTATIONAL: Gestational Diabetes Screen: 177 mg/dL — ABNORMAL HIGH (ref 65–139)

## 2016-10-28 ENCOUNTER — Encounter: Payer: Self-pay | Admitting: Certified Nurse Midwife

## 2016-10-29 ENCOUNTER — Other Ambulatory Visit: Payer: Self-pay

## 2016-10-29 DIAGNOSIS — Z131 Encounter for screening for diabetes mellitus: Secondary | ICD-10-CM

## 2016-10-29 DIAGNOSIS — O99210 Obesity complicating pregnancy, unspecified trimester: Secondary | ICD-10-CM

## 2016-11-18 ENCOUNTER — Encounter: Payer: Self-pay | Admitting: Certified Nurse Midwife

## 2016-11-18 ENCOUNTER — Ambulatory Visit (INDEPENDENT_AMBULATORY_CARE_PROVIDER_SITE_OTHER): Payer: BLUE CROSS/BLUE SHIELD | Admitting: Certified Nurse Midwife

## 2016-11-18 ENCOUNTER — Ambulatory Visit (INDEPENDENT_AMBULATORY_CARE_PROVIDER_SITE_OTHER): Payer: BLUE CROSS/BLUE SHIELD

## 2016-11-18 ENCOUNTER — Other Ambulatory Visit: Payer: Self-pay | Admitting: Certified Nurse Midwife

## 2016-11-18 VITALS — BP 126/79 | HR 88 | Wt 253.7 lb

## 2016-11-18 DIAGNOSIS — O99210 Obesity complicating pregnancy, unspecified trimester: Secondary | ICD-10-CM

## 2016-11-18 DIAGNOSIS — Z131 Encounter for screening for diabetes mellitus: Secondary | ICD-10-CM

## 2016-11-18 DIAGNOSIS — Z369 Encounter for antenatal screening, unspecified: Secondary | ICD-10-CM | POA: Diagnosis not present

## 2016-11-18 DIAGNOSIS — Z3481 Encounter for supervision of other normal pregnancy, first trimester: Secondary | ICD-10-CM

## 2016-11-18 DIAGNOSIS — Z3482 Encounter for supervision of other normal pregnancy, second trimester: Secondary | ICD-10-CM

## 2016-11-18 LAB — POCT URINALYSIS DIPSTICK
Bilirubin, UA: NEGATIVE
Blood, UA: NEGATIVE
Glucose, UA: NEGATIVE
Leukocytes, UA: NEGATIVE
Nitrite, UA: NEGATIVE
Protein, UA: NEGATIVE
Spec Grav, UA: 1.01 (ref 1.030–1.035)
Urobilinogen, UA: NEGATIVE (ref ?–2.0)
pH, UA: 7 (ref 5.0–8.0)

## 2016-11-18 NOTE — Progress Notes (Signed)
ROB-Pt doing well. Her husband, Bonnie Armstrong, is here today as well. US findings reviewed-incomplete anatomy, female. One (1) hour glucola today. Reviewed red flag symptoms and when to call. RTC x 2 weeks for follow up US. RTC x 4 weeks for ROB.   ULTRASOUND REPORT  Location: [facility] Date of Service: 11/18/16  Indications: Anatomy Findings:  Singleton intrauterine pregnancy is visualized with FHR at 153 BPM. Biometrics give an (U/S) Gestational age of [redacted] weeks and 2 days, and an (U/S) EDD of 04/05/17; this correlates with the clinically established EDD of 04/06/17.  Fetal presentation is vertex, spine variable.  EFW: 347 g ( 0 lbs. 12 oz. ) Placenta: Anterior, grade  1 and remote to cervix by 5.7 cm. AFI: Subjectively adequate with an MVP of 3.4 cm..  Anatomic survey is incomplete due to maternal body habitus and fetal position. Anatomy seen today appears WNL. Anatomy needed to complete exam: LVOT, AO/PA, AA, Open Hand, 5th digit, Post. Fossa, cerebellum and cisterna magna. Gender - Female.   Right and left ovaries were not visualized. There is no evidence of a corpus luteal cyst. Survey of the adnexa demonstrates no adnexal masses. There is no free peritoneal fluid in the cul de sac.  Impression: 1. 20 week 2 day Viable Singleton Intrauterine pregnancy by U/S. 2. (U/S) EDD is consistent with Clinically established (LMP) EDD of 04/06/17. 3. Incomplete anatomy scan due to maternal body habitus and fetal position. Anatomy seen today appears WNL.

## 2016-11-18 NOTE — Patient Instructions (Signed)
Gestational Diabetes Mellitus, Self Care When you have gestational diabetes (gestational diabetes mellitus), you must keep your blood sugar (glucose) under control. You can do this with:  Nutrition.  Exercise.  Lifestyle changes.  Medicines or insulin, if needed.  Support from your doctors and others. How do I manage my blood sugar?  Check your blood sugar every day during pregnancy. Check it as often as told.  Call your doctor if your blood sugar is above your goal numbers for 2 tests in a row. Your doctor will set treatment goals for you. Try to have these blood sugars:  After not eating for a long time (after fasting): at or below 95 mg/dL (5.3 mmol/L).  After meals (postprandial):  One hour after a meal: at or below 140 mg/dL (7.8 mmol/L).  Two hours after a meal: at or below 120 mg/dL (6.7 mmol/L).  A1c (hemoglobin A1c) level: 6-6.5%. What do I need to know about high blood sugar? High blood sugar is called hyperglycemia. Know the early signs of high blood sugar. Signs include:  Feeling:  Thirsty.  Hungry.  Very tired.  Needing to pee (urinate) more than usual.  Blurry vision. What do I need to know about low blood sugar? Low blood sugar is called hypoglycemia. This is when blood sugar is at or below 70 mg/dL (3.9 mmol/L). Symptoms may include:  Feeling:  Hungry.  Worried or nervous (anxious).  Sweaty or clammy.  Confused.  Dizzy.  Sleepy.  Sick to your stomach (nauseous).  Having:  A fast heartbeat.  A headache.  A change in vision.  Jerky movements that you cannot control (seizure).  Nightmares.  Tingling or no feeling (numbness) around the mouth, lips, or tongue.  Having trouble with:  Talking.  Paying attention (concentrating).  Moving (coordination).  Sleeping.  Shaking.  Passing out (fainting).  Getting upset easily (irritability). Treating low blood sugar   To treat low blood sugar, eat or drink something sugary  right away. If you can think clearly and swallow safely, follow the 15:15 rule:  Take 15 grams of a fast-acting carb (carbohydrate). Some fast-acting carbs are:  1 tube of glucose gel.  3 sugar tablets (glucose pills).  6-8 pieces of hard candy.  4 oz (120 mL) of fruit juice.  4 oz (120 mL) regular (not diet) soda.  Check your blood sugar 15 minutes after you take the carb.  If your blood sugar is still at or below 70 mg/dL (3.9 mmol/L), take 15 grams of a carb again.  If your blood sugar does not go above 70 mg/dL (3.9 mmol/L) after 3 tries, get help right away.  After your blood sugar goes back to normal, eat a meal or a snack within 1 hour. Treating very low blood sugar  If your blood sugar is at or below 54 mg/dL (3 mmol/L), you have very low blood sugar (severe hypoglycemia). This is an emergency. Do not wait to see if the symptoms will go away. Get medical help right away. Call your local emergency services (911 in the [place] Do not drive yourself to the hospital. If you have very low blood sugar and you cannot eat or drink, you may need a glucagon shot (injection). A family member or friend should learn:  How to check your blood sugar.  How to give you a glucagon shot. Ask your doctor if you need a glucagon shot kit at home. What else is important to manage my diabetes? Medicine   Take your insulin  and diabetes medicines as told.  Do not run out of insulin or medicines.  Adjust your insulin and diabetes medicines as told. Food    Make healthy food choices. These include:  Chicken, fish, egg whites, and beans.  Oats, whole wheat, bulgur, brown rice, quinoa, and millet.  Fresh fruits and vegetables.  Low-fat dairy products.  Nuts, avocado, olive oil, and canola oil.  Make an eating plan. A food specialist (dietitian) can help you.  Follow instructions from your doctor about what you cannot eat or drink.  Drink enough fluid to keep your pee (urine) clear or  pale yellow.  Eat healthy snacks between healthy meals.  Keep track of carbs you eat. Read food labels. Learn about food serving sizes.  Follow your sick day plan when you cannot eat or drink normally. Make this plan with your doctor. Activity   Exercise 30 minutes or more a day or as much as told by your doctor.  Talk with your doctor if you start a new exercise. Your doctor may need to adjust your insulin, medicines, or food. Lifestyle   Do not drink alcohol.  Do not use any tobacco products. If you need help quitting, ask your doctor.  Learn how to deal with stress. If you need help with this, ask your doctor. Body care    Stay up to date with your shots (immunizations).  Brush your teeth and gums two times a day. Floss at least one time a day.  Go to the dentist least one time every 6 months.  Stay at a healthy weight while you are pregnant. General instructions   Take over-the-counter and prescription medicines only as told by your doctor.  Ask your doctor about risks of high blood pressure in pregnancy. These are called preeclampsia and eclampsia.  Share your diabetes care plan with:  Your work or school.  People you live with.  Check your pee for ketones:  When you are sick.  As told by your doctor.  Ask your doctor:  Do I need to meet with a diabetes educator?  Where can I find a support group for people with diabetes?  Carry a card or wear jewelry that says that you have diabetes.  Keep all follow-up visits with your doctor. This is important. Care after giving birth    Have your blood sugar checked 4-12 weeks after you give birth.  Get checked for diabetes at least every 3 years. Where to find more information: To learn more about diabetes, visit:  American Diabetes Association: www.diabetes.org/diabetes-basics/gestational  Centers for Disease Control and Prevention (CDC): http://sanchez-watson.com/.pdf This  information is not intended to replace advice given to you by your health care provider. Make sure you discuss any questions you have with your health care provider. Document Released: 11/24/2015 Document Revised: 01/08/2016 Document Reviewed: 09/05/2015 Elsevier Interactive Patient Education  2017 [employer].

## 2016-11-19 LAB — GLUCOSE, 1 HOUR GESTATIONAL: Gestational Diabetes Screen: 182 mg/dL — ABNORMAL HIGH (ref 65–139)

## 2016-11-22 ENCOUNTER — Other Ambulatory Visit: Payer: Self-pay | Admitting: Certified Nurse Midwife

## 2016-11-26 ENCOUNTER — Other Ambulatory Visit: Payer: BLUE CROSS/BLUE SHIELD

## 2016-11-26 ENCOUNTER — Other Ambulatory Visit: Payer: Self-pay | Admitting: Certified Nurse Midwife

## 2016-11-26 ENCOUNTER — Encounter: Payer: BLUE CROSS/BLUE SHIELD | Admitting: Certified Nurse Midwife

## 2016-11-27 LAB — GESTATIONAL GLUCOSE TOLERANCE
Glucose, Fasting: 123 mg/dL — ABNORMAL HIGH (ref 65–94)
Glucose, GTT - 1 Hour: 240 mg/dL — ABNORMAL HIGH (ref 65–179)
Glucose, GTT - 2 Hour: 217 mg/dL — ABNORMAL HIGH (ref 65–154)
Glucose, GTT - 3 Hour: 156 mg/dL — ABNORMAL HIGH (ref 65–139)

## 2016-11-29 ENCOUNTER — Encounter: Payer: Self-pay | Admitting: Certified Nurse Midwife

## 2016-11-29 ENCOUNTER — Other Ambulatory Visit: Payer: Self-pay | Admitting: Certified Nurse Midwife

## 2016-11-29 DIAGNOSIS — O24419 Gestational diabetes mellitus in pregnancy, unspecified control: Secondary | ICD-10-CM

## 2016-12-02 ENCOUNTER — Ambulatory Visit (INDEPENDENT_AMBULATORY_CARE_PROVIDER_SITE_OTHER): Payer: BLUE CROSS/BLUE SHIELD

## 2016-12-02 DIAGNOSIS — Z3482 Encounter for supervision of other normal pregnancy, second trimester: Secondary | ICD-10-CM

## 2016-12-15 ENCOUNTER — Ambulatory Visit (INDEPENDENT_AMBULATORY_CARE_PROVIDER_SITE_OTHER): Payer: BLUE CROSS/BLUE SHIELD | Admitting: Certified Nurse Midwife

## 2016-12-15 ENCOUNTER — Encounter: Payer: Self-pay | Admitting: Certified Nurse Midwife

## 2016-12-15 VITALS — BP 117/68 | HR 89 | Wt 254.0 lb

## 2016-12-15 DIAGNOSIS — O24419 Gestational diabetes mellitus in pregnancy, unspecified control: Secondary | ICD-10-CM

## 2016-12-15 DIAGNOSIS — Z3492 Encounter for supervision of normal pregnancy, unspecified, second trimester: Secondary | ICD-10-CM

## 2016-12-15 LAB — POCT URINALYSIS DIPSTICK
Bilirubin, UA: NEGATIVE
Blood, UA: NEGATIVE
Glucose, UA: NEGATIVE
Ketones, UA: NEGATIVE
Leukocytes, UA: NEGATIVE
Nitrite, UA: NEGATIVE
Protein, UA: NEGATIVE
Spec Grav, UA: 1.015 (ref 1.010–1.025)
Urobilinogen, UA: 0.2 E.U./dL
pH, UA: 7 (ref 5.0–8.0)

## 2016-12-15 NOTE — Progress Notes (Signed)
ROB, doing well. Elle was recently diagnosed with GDM. She has not been able to meet with nutritionist for diabetic counseling due to her family circumstances. 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 She has been keeping a blood sugar log and modifying her diet. The log reveals consistent elevated fasting blood sugars and 2 elevated 2 hr pp. We discussed her diet. She states that she has really been trying to modify her diet but on occasion due to caring for mentally disable child she has" had to grab what she can". Dr. Logan Bores consulted regarding plan of care. Damonique really does not want to take medications.  Dr. Logan Bores recommends that she start Glyburide  at night starting now unless she is able to meet with nutritionist to discuss diet plan.  Pt agrees to call nutritionist ASAP to schedule appointment this week. Will monitor blood sugar for 1 wk with dietary changes. Pt is to return for review of blood sugars in one wk. Reviewed the risk of GDM in pregnancy. She verbalizes understanding and agrees to plan. Discussed fetal movement as indication of well being. PTL precautions reviewed. Return in one wk.  Doreene Burke, CNM

## 2016-12-15 NOTE — Patient Instructions (Signed)
Diabetes Mellitus and Exercise Exercising regularly is important for your overall health, especially when you have diabetes (diabetes mellitus). Exercising is not only about losing weight. It has many health benefits, such as increasing muscle strength and bone density and reducing body fat and stress. This leads to improved fitness, flexibility, and endurance, all of which result in better overall health. Exercise has additional benefits for people with diabetes, including:  Reducing appetite.  Helping to lower and control blood glucose.  Lowering blood pressure.  Helping to control amounts of fatty substances (lipids) in the blood, such as cholesterol and triglycerides.  Helping the body to respond better to insulin (improving insulin sensitivity).  Reducing how much insulin the body needs.  Decreasing the risk for heart disease by:  Lowering cholesterol and triglyceride levels.  Increasing the levels of good cholesterol.  Lowering blood glucose levels. What is my activity plan? Your health care provider or certified diabetes educator can help you make a plan for the type and frequency of exercise (activity plan) that works for you. Make sure that you:  Do at least 150 minutes of moderate-intensity or vigorous-intensity exercise each week. This could be brisk walking, biking, or water aerobics.  Do stretching and strength exercises, such as yoga or weightlifting, at least 2 times a week.  Spread out your activity over at least 3 days of the week.  Get some form of physical activity every day.  Do not go more than 2 days in a row without some kind of physical activity.  Avoid being inactive for more than 90 minutes at a time. Take frequent breaks to walk or stretch.  Choose a type of exercise or activity that you enjoy, and set realistic goals.  Start slowly, and gradually increase the intensity of your exercise over time. What do I need to know about managing my  diabetes?  Check your blood glucose before and after exercising.  If your blood glucose is higher than 240 mg/dL (13.3 mmol/L) before you exercise, check your urine for ketones. If you have ketones in your urine, do not exercise until your blood glucose returns to normal.  Know the symptoms of low blood glucose (hypoglycemia) and how to treat it. Your risk for hypoglycemia increases during and after exercise. Common symptoms of hypoglycemia can include:  Hunger.  Anxiety.  Sweating and feeling clammy.  Confusion.  Dizziness or feeling light-headed.  Increased heart rate or palpitations.  Blurry vision.  Tingling or numbness around the mouth, lips, or tongue.  Tremors or shakes.  Irritability.  Keep a rapid-acting carbohydrate snack available before, during, and after exercise to help prevent or treat hypoglycemia.  Avoid injecting insulin into areas of the body that are going to be exercised. For example, avoid injecting insulin into:  The arms, when playing tennis.  The legs, when jogging.  Keep records of your exercise habits. Doing this can help you and your health care provider adjust your diabetes management plan as needed. Write down:  Food that you eat before and after you exercise.  Blood glucose levels before and after you exercise.  The type and amount of exercise you have done.  When your insulin is expected to peak, if you use insulin. Avoid exercising at times when your insulin is peaking.  When you start a new exercise or activity, work with your health care provider to make sure the activity is safe for you, and to adjust your insulin, medicines, or food intake as needed.  Drink plenty   of water while you exercise to prevent dehydration or heat stroke. Drink enough fluid to keep your urine clear or pale yellow. This information is not intended to replace advice given to you by your health care provider. Make sure you discuss any questions you have with  your health care provider. Document Released: 10/23/2003 Document Revised: 02/20/2016 Document Reviewed: 01/12/2016 Elsevier Interactive Patient Education  2017 Elsevier Inc.  

## 2016-12-17 ENCOUNTER — Ambulatory Visit: Payer: Self-pay | Admitting: Dietician

## 2016-12-20 ENCOUNTER — Encounter: Payer: Self-pay | Admitting: Certified Nurse Midwife

## 2016-12-29 ENCOUNTER — Encounter: Payer: Self-pay | Admitting: Certified Nurse Midwife

## 2017-01-06 ENCOUNTER — Ambulatory Visit (INDEPENDENT_AMBULATORY_CARE_PROVIDER_SITE_OTHER): Payer: BLUE CROSS/BLUE SHIELD | Admitting: Obstetrics and Gynecology

## 2017-01-06 DIAGNOSIS — O26899 Other specified pregnancy related conditions, unspecified trimester: Secondary | ICD-10-CM

## 2017-01-06 DIAGNOSIS — R102 Pelvic and perineal pain: Secondary | ICD-10-CM

## 2017-01-06 DIAGNOSIS — Z3492 Encounter for supervision of normal pregnancy, unspecified, second trimester: Secondary | ICD-10-CM

## 2017-01-06 LAB — POCT URINALYSIS DIPSTICK
Bilirubin, UA: NEGATIVE
Blood, UA: NEGATIVE
Glucose, UA: NEGATIVE
Ketones, UA: 15
Leukocytes, UA: NEGATIVE
Nitrite, UA: NEGATIVE
Protein, UA: NEGATIVE
Spec Grav, UA: 1.01 (ref 1.010–1.025)
Urobilinogen, UA: 0.2 E.U./dL
pH, UA: 7 (ref 5.0–8.0)

## 2017-01-06 NOTE — Progress Notes (Signed)
OB WORK IN- pt has been having a lot of pelvic pressure since 11:00 am, just wanted some reassurance

## 2017-01-06 NOTE — Progress Notes (Signed)
Work in [employer]- pelvic pressure today;concerned due to PTL and delivery with second child at 30 weeks. Had stressful week with 754 yo nephew near drowning in her pool and she resuscitated him, denies changes in vaginal bleeding, BM, but does note increased urinary frequency. No sex in more than a week. FFN obtained due to previous history. Reassured and reminded of PTL precautions

## 2017-01-06 NOTE — Patient Instructions (Signed)
Preventing Preterm Birth °Preterm birth is when your baby is delivered between 20 weeks and 37 weeks of pregnancy. A full-term pregnancy lasts for at least 37 weeks. Preterm birth can be dangerous for your baby because the last few weeks of pregnancy are an important time for your baby's brain and lungs to grow. Many things can cause a baby to be born early. Sometimes the cause is not known. There are certain factors that make you more likely to experience preterm birth, such as: °· Having a previous baby born preterm. °· Being pregnant with twins or other multiples. °· Having had fertility treatment. °· Being overweight or underweight at the start of your pregnancy. °· Having any of the following during pregnancy: °¨ An infection, including a urinary tract infection (UTI) or an STI (sexually transmitted infection). °¨ High blood pressure. °¨ Diabetes. °¨ Vaginal bleeding. °· Being age 35 or older. °· Being age 18 or younger. °· Getting pregnant within 6 months of a previous pregnancy. °· Suffering extreme stress or physical or emotional abuse during pregnancy. °· Standing for long periods of time during pregnancy, such as working at a job that requires standing. °What are the risks? °The most serious risk of preterm birth is that the baby may not survive. This is more likely to happen if a baby is born before 34 weeks. Other risks and complications of preterm birth may include your baby having: °· Breathing problems. °· Brain damage that affects movement and coordination (cerebral palsy). °· Feeding difficulties. °· Vision or hearing problems. °· Infections or inflammation of the digestive tract (colitis). °· Developmental delays. °· Learning disabilities. °· Higher risk for diabetes, heart disease, and high blood pressure later in life. °What can I do to lower my risk? °Medical care °The most important thing you can do to lower your risk for preterm birth is to get routine medical care during pregnancy (prenatal  care). If you have a high risk of preterm birth, you may be referred to a health care provider who specializes in managing high-risk pregnancies (perinatologist). You may be given medicine to help prevent preterm birth. °Lifestyle changes °Certain lifestyle changes can also lower your risk of preterm birth: °· Wait at least 6 months after a pregnancy to become pregnant again. °· Try to plan pregnancy for when you are between 19 and 35 years old. °· Get to a healthy weight before getting pregnant. If you are overweight, work with your health care provider to safely lose weight. °· Do not use any products that contain nicotine or tobacco, such as cigarettes and e-cigarettes. If you need help quitting, ask your health care provider. °· Do not drink alcohol. °· Do not use drugs. °Where to find support: °For more support, consider: °· Talking with your health care provider. °· Talking with a therapist or substance abuse counselor, if you need help quitting. °· Working with a diet and nutrition specialist (dietitian) or a personal trainer to maintain a healthy weight. °· Joining a support group. °Where to find more information: °Learn more about preventing preterm birth from: °· Centers for Disease Control and Prevention: cdc.gov/reproductivehealth/maternalinfanthealth/pretermbirth.htm °· March of Dimes: marchofdimes.org/complications/premature-babies.aspx °· American Pregnancy Association: americanpregnancy.org/labor-and-birth/premature-labor °Contact a health care provider if: °· You have any of the following signs of preterm labor before 37 weeks: °¨ A change or increase in vaginal discharge. °¨ Fluid leaking from your vagina. °¨ Pressure or cramps in your lower abdomen. °¨ A backache that does not go away or gets worse. °¨   Regular tightening (contractions) in your lower abdomen. °Summary °· Preterm birth means having your baby during weeks 20-37 of pregnancy. °· Preterm birth may put your baby at risk for physical and  mental problems. °· Getting good prenatal care can help prevent preterm birth. °· You can lower your risk of preterm birth by making certain lifestyle changes, such as not smoking and not using alcohol. °This information is not intended to replace advice given to you by your health care provider. Make sure you discuss any questions you have with your health care provider. °Document Released: 09/16/2015 Document Revised: 04/10/2016 Document Reviewed: 04/10/2016 °Elsevier Interactive Patient Education © 2017 Elsevier Inc. ° °

## 2017-01-07 ENCOUNTER — Encounter: Payer: Self-pay | Admitting: Obstetrics and Gynecology

## 2017-01-07 LAB — FETAL FIBRONECTIN: Fetal Fibronectin: NEGATIVE

## 2017-01-13 ENCOUNTER — Encounter: Payer: Self-pay | Admitting: Certified Nurse Midwife

## 2017-01-13 ENCOUNTER — Ambulatory Visit (INDEPENDENT_AMBULATORY_CARE_PROVIDER_SITE_OTHER): Payer: BLUE CROSS/BLUE SHIELD | Admitting: Certified Nurse Midwife

## 2017-01-13 VITALS — BP 110/63 | HR 87 | Wt 250.8 lb

## 2017-01-13 DIAGNOSIS — Z130 Encounter for screening for diseases of the blood and blood-forming organs and certain disorders involving the immune mechanism: Secondary | ICD-10-CM

## 2017-01-13 DIAGNOSIS — Z3483 Encounter for supervision of other normal pregnancy, third trimester: Secondary | ICD-10-CM

## 2017-01-13 DIAGNOSIS — Z23 Encounter for immunization: Secondary | ICD-10-CM | POA: Diagnosis not present

## 2017-01-13 LAB — POCT URINALYSIS DIPSTICK
Bilirubin, UA: NEGATIVE
Blood, UA: NEGATIVE
Glucose, UA: NEGATIVE
Ketones, UA: NEGATIVE
Leukocytes, UA: NEGATIVE
Nitrite, UA: NEGATIVE
Protein, UA: NEGATIVE
Spec Grav, UA: 1.015 (ref 1.010–1.025)
Urobilinogen, UA: 0.2 E.U./dL
pH, UA: 6 (ref 5.0–8.0)

## 2017-01-13 NOTE — Patient Instructions (Addendum)
DTaP Vaccine (Diphtheria, Tetanus, and Pertussis): What You Need to Know 1. Why get vaccinated? Diphtheria, tetanus, and pertussis are serious diseases caused by bacteria. Diphtheria and pertussis are spread from person to person. Tetanus enters the body through cuts or wounds. DIPHTHERIA causes a thick covering in the back of the throat.  It can lead to breathing problems, paralysis, heart failure, and even death.  TETANUS (Lockjaw) causes painful tightening of the muscles, usually all over the body.  It can lead to "locking" of the jaw so the victim cannot open his mouth or swallow. Tetanus leads to death in up to 2 out of 10 cases.  PERTUSSIS (Whooping Cough) causes coughing spells so bad that it is hard for infants to eat, drink, or breathe. These spells can last for weeks.  It can lead to pneumonia, seizures (jerking and staring spells), brain damage, and death.  Diphtheria, tetanus, and pertussis vaccine (DTaP) can help prevent these diseases. Most children who are vaccinated with DTaP will be protected throughout childhood. Many more children would get these diseases if we stopped vaccinating. DTaP is a safer version of an older vaccine called DTP. DTP is no longer used in the [place]. 2. Who should get DTaP vaccine and when? Children should get 5 doses of DTaP vaccine, one dose at each of the following ages:  2 months  4 months  6 months  15-18 months  4-6 years  DTaP may be given at the same time as other vaccines. 3. Some children should not get DTaP vaccine or should wait  Children with minor illnesses, such as a cold, may be vaccinated. But children who are moderately or severely ill should usually wait until they recover before getting DTaP vaccine.  Any child who had a life-threatening allergic reaction after a dose of DTaP should not get another dose.  Any child who suffered a brain or nervous system disease within 7 days after a dose of DTaP should not get  another dose.  Talk with your doctor if your child: ? had a seizure or collapsed after a dose of DTaP, ? cried non-stop for 3 hours or more after a dose of DTaP, ? had a fever over 105F after a dose of DTaP. Ask your doctor for more information. Some of these children should not get another dose of pertussis vaccine, but may get a vaccine without pertussis, called DT. 4. Older children and adults DTaP is not licensed for adolescents, adults, or children 38 years of age and older. But older people still need protection. A vaccine called Tdap is similar to DTaP. A single dose of Tdap is recommended for people 11 through 38 years of age. Another vaccine, called Td, protects against tetanus and diphtheria, but not pertussis. It is recommended every 10 years. There are separate Vaccine Information Statements for these vaccines. 5. What are the risks from DTaP vaccine? Getting diphtheria, tetanus, or pertussis disease is much riskier than getting DTaP vaccine. However, a vaccine, like any medicine, is capable of causing serious problems, such as severe allergic reactions. The risk of DTaP vaccine causing serious harm, or death, is extremely small. Mild problems (common)  Fever (up to about 1 child in 4)  Redness or swelling where the shot was given (up to about 1 child in 4)  Soreness or tenderness where the shot was given (up to about 1 child in 4) These problems occur more often after the 4th and 5th doses of the DTaP series than after  earlier doses. Sometimes the 4th or 5th dose of DTaP vaccine is followed by swelling of the entire arm or leg in which the shot was given, lasting 1-7 days (up to about 1 child in 30). Other mild problems include:  Fussiness (up to about 1 child in 3)  Tiredness or poor appetite (up to about 1 child in 10)  Vomiting (up to about 1 child in 50) These problems generally occur 1-3 days after the shot. Moderate problems (uncommon)  Seizure (jerking or staring)  (about 1 child out of 14,000)  Non-stop crying, for 3 hours or more (up to about 1 child out of 1,000)  High fever, over 105F (about 1 child out of 16,000) Severe problems (very rare)  Serious allergic reaction (less than 1 out of a million doses)  Several other severe problems have been reported after DTaP vaccine. These include: ? Long-term seizures, coma, or lowered consciousness ? Permanent brain damage. These are so rare it is hard to tell if they are caused by the vaccine. Controlling fever is especially important for children who have had seizures, for any reason. It is also important if another family member has had seizures. You can reduce fever and pain by giving your child an aspirin-free pain reliever when the shot is given, and for the next 24 hours, following the package instructions. 6. What if there is a serious reaction? What should I look for? Look for anything that concerns you, such as signs of a severe allergic reaction, very high fever, or behavior changes. Signs of a severe allergic reaction can include hives, swelling of the face and throat, difficulty breathing, a fast heartbeat, dizziness, and weakness. These would start a few minutes to a few hours after the vaccination. What should I do?  If you think it is a severe allergic reaction or other emergency that can't wait, call 9-1-1 or get the person to the nearest hospital. Otherwise, call your doctor.  Afterward, the reaction should be reported to the Vaccine Adverse Event Reporting System (VAERS). Your doctor might file this report, or you can do it yourself through the VAERS web site at www.vaers.LAgents.nohhs.gov, or by calling 1-(747)261-8556. ? VAERS is only for reporting reactions. They do not give medical advice. 7. The National Vaccine Injury Compensation Program The [employer] Vaccine Injury Compensation Program (VICP) is a federal program that was created to compensate people who may have been injured by certain  vaccines. Persons who believe they may have been injured by a vaccine can learn about the program and about filing a claim by calling 1-253-707-3336 or visiting the VICP website at SpiritualWord.atwww.hrsa.gov/vaccinecompensation. 8. How can I learn more?  Ask your doctor.  Call your local or state health department.  Contact the Centers for Disease Control and Prevention (CDC): ? Call 56146918421-2793400018 (1-800-CDC-INFO) or ? Visit CDC's website at PicCapture.uywww.cdc.gov/vaccines CDC DTaP Vaccine (Diphtheria, Tetanus, and Pertussis) VIS (12/30/05) This information is not intended to replace advice given to you by your health care provider. Make sure you discuss any questions you have with your health care provider. Document Released: 05/30/2006 Document Revised: 04/22/2016 Document Reviewed: 04/22/2016 Elsevier Interactive Patient Education  2017 [employer]  Third Trimester of Pregnancy The third trimester is from week 28 through week 40 (months 7 through 9). The third trimester is a time when the unborn baby (fetus) is growing rapidly. At the end of the ninth month, the fetus is about 20 inches in length and weighs 6-10 pounds. Body changes during your  third trimester Your body will continue to go through many changes during pregnancy. The changes vary from woman to woman. During the third trimester:  Your weight will continue to increase. You can expect to gain 25-35 pounds (11-16 kg) by the end of the pregnancy.  You may begin to get stretch marks on your hips, abdomen, and breasts.  You may urinate more often because the fetus is moving lower into your pelvis and pressing on your bladder.  You may develop or continue to have heartburn. This is caused by increased hormones that slow down muscles in the digestive tract.  You may develop or continue to have constipation because increased hormones slow digestion and cause the muscles that push waste through your intestines to relax.  You may develop hemorrhoids.  These are swollen veins (varicose veins) in the rectum that can itch or be painful.  You may develop swollen, bulging veins (varicose veins) in your legs.  You may have increased body aches in the pelvis, back, or thighs. This is due to weight gain and increased hormones that are relaxing your joints.  You may have changes in your hair. These can include thickening of your hair, rapid growth, and changes in texture. Some women also have hair loss during or after pregnancy, or hair that feels dry or thin. Your hair will most likely return to normal after your baby is born.  Your breasts will continue to grow and they will continue to become tender. A yellow fluid (colostrum) may leak from your breasts. This is the first milk you are producing for your baby.  Your belly button may stick out.  You may notice more swelling in your hands, face, or ankles.  You may have increased tingling or numbness in your hands, arms, and legs. The skin on your belly may also feel numb.  You may feel short of breath because of your expanding uterus.  You may have more problems sleeping. This can be caused by the size of your belly, increased need to urinate, and an increase in your body's metabolism.  You may notice the fetus "dropping," or moving lower in your abdomen (lightening).  You may have increased vaginal discharge.  You may notice your joints feel loose and you may have pain around your pelvic bone.  What to expect at prenatal visits You will have prenatal exams every 2 weeks until week 36. Then you will have weekly prenatal exams. During a routine prenatal visit:  You will be weighed to make sure you and the baby are growing normally.  Your blood pressure will be taken.  Your abdomen will be measured to track your baby's growth.  The fetal heartbeat will be listened to.  Any test results from the previous visit will be discussed.  You may have a cervical check near your due date to see  if your cervix has softened or thinned (effaced).  You will be tested for Group B streptococcus. This happens between 35 and 37 weeks.  Your health care provider may ask you:  What your birth plan is.  How you are feeling.  If you are feeling the baby move.  If you have had any abnormal symptoms, such as leaking fluid, bleeding, severe headaches, or abdominal cramping.  If you are using any tobacco products, including cigarettes, chewing tobacco, and electronic cigarettes.  If you have any questions.  Other tests or screenings that may be performed during your third trimester include:  Blood tests that check for  low iron levels (anemia).  Fetal testing to check the health, activity level, and growth of the fetus. Testing is done if you have certain medical conditions or if there are problems during the pregnancy.  Nonstress test (NST). This test checks the health of your baby to make sure there are no signs of problems, such as the baby not getting enough oxygen. During this test, a belt is placed around your belly. The baby is made to move, and its heart rate is monitored during movement.  What is false labor? False labor is a condition in which you feel small, irregular tightenings of the muscles in the womb (contractions) that usually go away with rest, changing position, or drinking water. These are called Braxton Hicks contractions. Contractions may last for hours, days, or even weeks before true labor sets in. If contractions come at regular intervals, become more frequent, increase in intensity, or become painful, you should see your health care provider. What are the signs of labor?  Abdominal cramps.  Regular contractions that start at 10 minutes apart and become stronger and more frequent with time.  Contractions that start on the top of the uterus and spread down to the lower abdomen and back.  Increased pelvic pressure and dull back pain.  A watery or bloody mucus  discharge that comes from the vagina.  Leaking of amniotic fluid. This is also known as your "water breaking." It could be a slow trickle or a gush. Let your health care provider know if it has a color or strange odor. If you have any of these signs, call your health care provider right away, even if it is before your due date. Follow these instructions at home: Medicines  Follow your health care provider's instructions regarding medicine use. Specific medicines may be either safe or unsafe to take during pregnancy.  Take a prenatal vitamin that contains at least 600 micrograms (mcg) of folic acid.  If you develop constipation, try taking a stool softener if your health care provider approves. Eating and drinking  Eat a balanced diet that includes fresh fruits and vegetables, whole grains, good sources of protein such as meat, eggs, or tofu, and low-fat dairy. Your health care provider will help you determine the amount of weight gain that is right for you.  Avoid raw meat and uncooked cheese. These carry germs that can cause birth defects in the baby.  If you have low calcium intake from food, talk to your health care provider about whether you should take a daily calcium supplement.  Eat four or five small meals rather than three large meals a day.  Limit foods that are high in fat and processed sugars, such as fried and sweet foods.  To prevent constipation: ? Drink enough fluid to keep your urine clear or pale yellow. ? Eat foods that are high in fiber, such as fresh fruits and vegetables, whole grains, and beans. Activity  Exercise only as directed by your health care provider. Most women can continue their usual exercise routine during pregnancy. Try to exercise for 30 minutes at least 5 days a week. Stop exercising if you experience uterine contractions.  Avoid heavy lifting.  Do not exercise in extreme heat or humidity, or at high altitudes.  Wear low-heel, comfortable  shoes.  Practice good posture.  You may continue to have sex unless your health care provider tells you otherwise. Relieving pain and discomfort  Take frequent breaks and rest with your legs elevated if you  have leg cramps or low back pain.  Take warm sitz baths to soothe any pain or discomfort caused by hemorrhoids. Use hemorrhoid cream if your health care provider approves.  Wear a good support bra to prevent discomfort from breast tenderness.  If you develop varicose veins: ? Wear support pantyhose or compression stockings as told by your healthcare provider. ? Elevate your feet for 15 minutes, 3-4 times a day. Prenatal care  Write down your questions. Take them to your prenatal visits.  Keep all your prenatal visits as told by your health care provider. This is important. Safety  Wear your seat belt at all times when driving.  Make a list of emergency phone numbers, including numbers for family, friends, the hospital, and police and fire departments. General instructions  Avoid cat litter boxes and soil used by cats. These carry germs that can cause birth defects in the baby. If you have a cat, ask someone to clean the litter box for you.  Do not travel far distances unless it is absolutely necessary and only with the approval of your health care provider.  Do not use hot tubs, steam rooms, or saunas.  Do not drink alcohol.  Do not use any products that contain nicotine or tobacco, such as cigarettes and e-cigarettes. If you need help quitting, ask your health care provider.  Do not use any medicinal herbs or unprescribed drugs. These chemicals affect the formation and growth of the baby.  Do not douche or use tampons or scented sanitary pads.  Do not cross your legs for long periods of time.  To prepare for the arrival of your baby: ? Take prenatal classes to understand, practice, and ask questions about labor and delivery. ? Make a trial run to the  hospital. ? Visit the hospital and tour the maternity area. ? Arrange for maternity or paternity leave through employers. ? Arrange for family and friends to take care of pets while you are in the hospital. ? Purchase a rear-facing car seat and make sure you know how to install it in your car. ? Pack your hospital bag. ? Prepare the baby's nursery. Make sure to remove all pillows and stuffed animals from the baby's crib to prevent suffocation.  Visit your dentist if you have not gone during your pregnancy. Use a soft toothbrush to brush your teeth and be gentle when you floss. Contact a health care provider if:  You are unsure if you are in labor or if your water has broken.  You become dizzy.  You have mild pelvic cramps, pelvic pressure, or nagging pain in your abdominal area.  You have lower back pain.  You have persistent nausea, vomiting, or diarrhea.  You have an unusual or bad smelling vaginal discharge.  You have pain when you urinate. Get help right away if:  Your water breaks before 37 weeks.  You have regular contractions less than 5 minutes apart before 37 weeks.  You have a fever.  You are leaking fluid from your vagina.  You have spotting or bleeding from your vagina.  You have severe abdominal pain or cramping.  You have rapid weight loss or weight gain.  You have shortness of breath with chest pain.  You notice sudden or extreme swelling of your face, hands, ankles, feet, or legs.  Your baby makes fewer than 10 movements in 2 hours.  You have severe headaches that do not go away when you take medicine.  You have vision changes. Summary  The third trimester is from week 28 through week 40, months 7 through 9. The third trimester is a time when the unborn baby (fetus) is growing rapidly.  During the third trimester, your discomfort may increase as you and your baby continue to gain weight. You may have abdominal, leg, and back pain, sleeping  problems, and an increased need to urinate.  During the third trimester your breasts will keep growing and they will continue to become tender. A yellow fluid (colostrum) may leak from your breasts. This is the first milk you are producing for your baby.  False labor is a condition in which you feel small, irregular tightenings of the muscles in the womb (contractions) that eventually go away. These are called Braxton Hicks contractions. Contractions may last for hours, days, or even weeks before true labor sets in.  Signs of labor can include: abdominal cramps; regular contractions that start at 10 minutes apart and become stronger and more frequent with time; watery or bloody mucus discharge that comes from the vagina; increased pelvic pressure and dull back pain; and leaking of amniotic fluid. This information is not intended to replace advice given to you by your health care provider. Make sure you discuss any questions you have with your health care provider. Document Released: 07/27/2001 Document Revised: 01/08/2016 Document Reviewed: 10/03/2012 Elsevier Interactive Patient Education  2017 [employer].

## 2017-01-13 NOTE — Progress Notes (Signed)
ROB-Pt doing better this week after the events of the last week. Did not bring sugar log to office today, but reports majority of fasting sugars less than 100 excluding five (5). Reviewed risks associated with uncontrolled gestational diabetes and continuously elevated blood sugars, pt verbalized understanding. Declines oral glucose management agents at this time. Will send blood sugar log via MyChart next week. TDaP given today. Blood consent signed. H/H to be drawn today. Plans breastfeeding, skin to skin and delayed cord clamping. Desires natural childbirth. Reviewed red flag symptoms and when to call. RTC x 2 weeks for ROB. Send blood sugar log via MyChart next week.

## 2017-01-14 LAB — HEMOGLOBIN AND HEMATOCRIT, BLOOD
Hematocrit: 34.5 % (ref 34.0–46.6)
Hemoglobin: 11.3 g/dL (ref 11.1–15.9)

## 2017-01-31 ENCOUNTER — Ambulatory Visit (INDEPENDENT_AMBULATORY_CARE_PROVIDER_SITE_OTHER): Payer: BLUE CROSS/BLUE SHIELD | Admitting: Certified Nurse Midwife

## 2017-01-31 VITALS — BP 113/84 | HR 90 | Wt 246.4 lb

## 2017-01-31 DIAGNOSIS — J3489 Other specified disorders of nose and nasal sinuses: Secondary | ICD-10-CM

## 2017-01-31 DIAGNOSIS — Z3483 Encounter for supervision of other normal pregnancy, third trimester: Secondary | ICD-10-CM

## 2017-01-31 DIAGNOSIS — O24419 Gestational diabetes mellitus in pregnancy, unspecified control: Secondary | ICD-10-CM

## 2017-01-31 DIAGNOSIS — H9202 Otalgia, left ear: Secondary | ICD-10-CM

## 2017-01-31 LAB — POCT URINALYSIS DIPSTICK
Bilirubin, UA: NEGATIVE
Glucose, UA: NEGATIVE
Ketones, UA: 40
Leukocytes, UA: NEGATIVE
Nitrite, UA: NEGATIVE
Spec Grav, UA: >=1.03 — AB (ref 1.010–1.025)
Urobilinogen, UA: 0.2 E.U./dL
pH, UA: 6 (ref 5.0–8.0)

## 2017-01-31 MED ORDER — CEFDINIR 300 MG PO CAPS: 300.0000 mg | ORAL_CAPSULE | Freq: Two times a day (BID) | ORAL | 0 refills | Status: AC

## 2017-01-31 NOTE — Patient Instructions (Signed)
Cefdinir capsules What is this medicine? CEFDINIR (SEF di ner) is a cephalosporin antibiotic. It is used to treat certain kinds of bacterial infections. It will not work for colds, flu, or other viral infections. This medicine may be used for other purposes; ask your health care provider or pharmacist if you have questions. COMMON BRAND NAME(S): Omnicef What should I tell my health care provider before I take this medicine? They need to know if you have any of these conditions: -bleeding problems -kidney disease -stomach or intestine problems (especially colitis) -an unusual or allergic reaction to cefdinir, other cephalosporin antibiotics, penicillin, penicillamine, other foods, dyes or preservatives -pregnant or trying to get pregnant -breast-feeding How should I use this medicine? Take this medicine by mouth. Swallow it with a drink of water. Follow the directions on the prescription label. You can take it with or without food. If it upsets your stomach it may help to take it with food. Take your doses at regular intervals. Do not take it more often than directed. Finish all the medicine you are prescribed even if you think your infection is better. Talk to your pediatrician regarding the use of this medicine in children. Special care may be needed. Overdosage: If you think you have taken too much of this medicine contact a poison control center or emergency room at once. NOTE: This medicine is only for you. Do not share this medicine with others. What if I miss a dose? If you miss a dose, take it as soon as you can. If it is almost time for your next dose, take only that dose. Do not take double or extra doses. What may interact with this medicine? -antacids that contain aluminum or magnesium -iron supplements -other antibiotics -probenecid This list may not describe all possible interactions. Give your health care provider a list of all the medicines, herbs, non-prescription drugs, or  dietary supplements you use. Also tell them if you smoke, drink alcohol, or use illegal drugs. Some items may interact with your medicine. What should I watch for while using this medicine? Tell your doctor or health care professional if your symptoms do not get better in a few days. If you are diabetic you may get a false-positive result for sugar in your urine. Check with your doctor or health care professional before you change your diet or the dose of your diabetes medicine. What side effects may I notice from receiving this medicine? Side effects that you should report to your doctor or health care professional as soon as possible: -allergic reactions like skin rash, itching or hives, swelling of the face, lips, or tongue -bloody or watery diarrhea -breathing problems -fever -redness, blistering, peeling or loosening of the skin, including inside the mouth -seizures -trouble passing urine or change in the amount of urine -unusual bleeding or bruising -unusually weak or tired Side effects that usually do not require medical attention (report to your doctor or health care professional if they continue or are bothersome): -constipation -diarrhea -dizziness -dry mouth -headache -loss of appetite -nausea, vomiting -stomach pain -stool discoloration -tiredness -vaginal discharge, itching, or odor in women This list may not describe all possible side effects. Call your doctor for medical advice about side effects. You may report side effects to FDA at 1-800-FDA-1088. Where should I keep my medicine? Keep out of the reach of children. Store at room temperature between 15 and 30 degrees C (59 and 86 degrees F). Throw the medicine away after the expiration date. NOTE: This sheet  is a summary. It may not cover all possible information. If you have questions about this medicine, talk to your doctor, pharmacist, or health care provider.  2018 Elsevier/Gold Standard (2015-12-08  15:52:44) Gestational Diabetes Mellitus, Self Care Caring for yourself after you have been diagnosed with gestational diabetes (gestational diabetes mellitus) means keeping your blood sugar (glucose) under control with a balance of:  Nutrition.  Exercise.  Lifestyle changes.  Medicines or insulin, if necessary.  Support from your team of health care providers and others.  The following information explains what you need to know to manage your gestational diabetes at home. What do I need to do to manage my blood glucose?  Check your blood glucose every day during your pregnancy. Do this as often as told by your health care provider.  Contact your health care provider if your blood glucose is above your target for 2 tests in a row. Your health care provider will set individualized treatment goals for you. Generally, the goal of treatment is to maintain the following blood glucose levels during pregnancy:  After not eating for 8 hours (after fasting): at or below 95 mg/dL (5.3 mmol/L).  After meals (postprandial): ? One hour after a meal: at or below 140 mg/dL (7.8 mmol/L). ? Two hours after a meal: at or below 120 mg/dL (6.7 mmol/L).  A1c (hemoglobin A1c) level: 6-6.5%.  What do I need to know about hyperglycemia and hypoglycemia? What is hyperglycemia? Hyperglycemia, also called high blood glucose, occurs when blood glucose is too high. Make sure you know the early signs of hyperglycemia, such as:  Increased thirst.  Hunger.  Feeling very tired.  Needing to urinate more often than usual.  Blurry vision.  What is hypoglycemia? Hypoglycemia, also called low blood glucose, occurswith a blood glucose level at or below 70 mg/dL (3.9 mmol/L). The risk for hypoglycemia increases during or after exercise, during sleep, during illness, and when skipping meals or not eating for a long time (fasting). It is important to know the symptoms of hypoglycemia and treat it right away.  Always have a 15-gram rapid-acting carbohydrate snack with you to treat low blood glucose.Family members and close friends should also know the symptoms and should understand how to treat hypoglycemia, in case you are not able to treat yourself. What are the symptoms of hypoglycemia? Hypoglycemia symptoms can include:  Hunger.  Anxiety.  Sweating and feeling clammy.  Confusion.  Dizziness or feeling light-headed.  Sleepiness.  Nausea.  Increased heart rate.  Headache.  Blurry vision.  Seizure.  Nightmares.  Tingling or numbness around the mouth, lips, or tongue.  A change in speech.  Decreased ability to concentrate.  A change in coordination.  Restless sleep.  Tremors or shakes.  Fainting.  Irritability.  How do I treat hypoglycemia?  If you are alert and able to swallow safely, follow the 15:15 rule:  Take 15 grams of a rapid-acting carbohydrate. Rapid-acting options include: ? 1 tube of glucose gel. ? 3 glucose pills. ? 6-8 pieces of hard candy. ? 4 oz (120 mL) of fruit juice. ? 4 oz (120 mL) of regular (not diet) soda.  Check your blood glucose 15 minutes after you take the carbohydrate.  If the repeat blood glucose level is still at or below 70 mg/dL (3.9 mmol/L), take 15 grams of a carbohydrate again.  If your blood glucose level does not increase above 70 mg/dL (3.9 mmol/L) after 3 tries, seek emergency medical care.  After your blood glucose level  returns to normal, eat a meal or a snack within 1 hour.  How do I treat severe hypoglycemia? Severe hypoglycemia is when your blood glucose level is at or below 54 mg/dL (3 mmol/L). Severe hypoglycemia is an emergency. Do not wait to see if the symptoms will go away. Get medical help right away. Call your local emergency services (911 in the [place] Do not drive yourself to the hospital. If you have severe hypoglycemia and you cannot eat or drink, you may need an injection of glucagon. A family member  or close friend should learn how to check your blood glucose and how to give you a glucagon injection. Ask your health care provider if you need to have an emergency glucagon injection kit available. Severe hypoglycemia may need to be treated in a hospital. The treatment may include getting glucose through an IV tube. You may also need treatment for the cause of your hypoglycemia. What else can I do to manage my gestational diabetes? Take your diabetes medicines as told  If your health care provider prescribed insulin or diabetes medicines, take them every day.  Do not run out of insulin or other diabetes medicines that you take. Plan ahead so you always have these available.  If you use insulin, adjust your dosage based on how physically active you are and what foods you eat. Your health care provider will tell you how to adjust your dosage. Make healthy food choices  The things that you eat and drink affect your blood glucose. Making good choices helps to control your diabetes and prevent other health problems. A healthy meal plan includes eating lean proteins, complex carbohydrates, fresh fruits and vegetables, low-fat dairy products, and healthy fats. Make an appointment to see a diet and nutrition specialist (registered dietitian) to help you create an eating plan that is right for you. Make sure that you:  Follow instructions from your health care provider about eating or drinking restrictions.  Drink enough fluid to keep your urine clear or pale yellow.  Eat healthy snacks between nutritious meals.  Track the carbohydrates that you eat. Do this by reading food labels and learning the standard serving sizes of foods.  Follow your sick day plan whenever you cannot eat or drink as usual. Make this plan in advance with your health care provider.  Stay active   Do at least 30 minutes of physical activity a day, or as much physical activity as your health care provider recommends during  your pregnancy. ? Doing 10 minutes of exercise 30 minutes after each meal may help to control postprandial blood glucose levels.  If you start a new exercise or activity, work with your health care provider to adjust your insulin, medicines, or food intake as needed. Make healthy lifestyle choices  Do not drink alcohol.  Do not use any tobacco products, such as cigarettes, chewing tobacco, and e-cigarettes. If you need help quitting, ask your health care provider.  Learn to manage stress. If you need help with this, ask your health care provider. Care for your body  Keep your immunizations up to date.  Brush your teeth and gums two times a day, and floss at least one time a day.  Visit your dentist at least once every 6 months.  Maintain a healthy weight during your pregnancy. General instructions   Take over-the-counter and prescription medicines only as told by your health care provider.  Talk with your health care provider about your risk for high blood pressure  during pregnancy (preeclampsia or eclampsia).  Share your diabetes management plan with people in your workplace, school, and household.  Check your urine for ketones during your pregnancy when you are ill and as told by your health care provider.  Carry a medical alert card or wear medical alert jewelry.  Ask your health care provider: ? Do I need to meet with a diabetes educator? ? Where can I find a support group for people with diabetes?  Keep all follow-up visits during your pregnancy (prenatal) and after delivery (postnatal) as told by your health care provider. This is important. Get the care that you need after delivery  Have your blood glucose level checked 4-12 weeks after delivery. This is done with an oral glucose tolerance test (OGTT).  Get screened for diabetes at least every 3 years, or as often as told by your health care provider. Where to find more information: To learn more about gestational  diabetes, visit:  American Diabetes Association (ADA): www.diabetes.org/diabetes-basics/gestational  Centers for Disease Control and Prevention (CDC): http://sanchez-watson.com/.pdf  This information is not intended to replace advice given to you by your health care provider. Make sure you discuss any questions you have with your health care provider. Document Released: 11/24/2015 Document Revised: 01/08/2016 Document Reviewed: 09/05/2015 Elsevier Interactive Patient Education  2017 [employer].

## 2017-01-31 NOTE — Progress Notes (Signed)
Subjective:   Bonnie GangChristen L Armstrong is a 36 y.o. [id number] being seen today for her obstetrical visit.    She reports headache, left sided ear and facial pain, and sinus pressure that radiates to her teeth and jaw since Saturday, 01/29/2017. History significant for frequent ear infections. Denies relief with home treatment measures.   Denies contractions, vaginal bleeding or leaking of fluid.  Reports good fetal movement.  She did not bring her blood sugar log to today's visits, but states that all of her daytime levels are normal and her fasting levels range from 92-113 with majority 97-98.   The following portions of the patient's history were reviewed and updated as appropriate: allergies, current medications, past family history, past medical history, past social history, past surgical history and problem list.   Objective:  BP 113/84   Pulse (!) 110   Wt 246 lb 6.4 oz (111.8 kg)   LMP 06/30/2016 (Approximate)   BMI 42.29 kg/m   General: Alert and oriented x 4, no apparent distress  HENT: Unable to visualize left tympanic membrane due to drainage. Tragus tender to touch. Left maxillary sinus tenderness present. Left mandibular tenderness present.    Results for orders placed or performed in visit on 01/31/17 (from the past 24 hour(s))  POCT urinalysis dipstick     Status: Abnormal   Collection Time: 01/31/17 10:58 AM  Result Value Ref Range   Color, UA dark yellow    Clarity, UA cloudy    Glucose, UA neg    Bilirubin, UA neg    Ketones, UA 40    Spec Grav, UA >=1.030 (A) 1.010 - 1.025   Blood, UA NHT MOD    pH, UA 6.0 5.0 - 8.0   Protein, UA 30+    Urobilinogen, UA 0.2 0.2 or 1.0 E.U./dL   Nitrite, UA neg    Leukocytes, UA Negative Negative    Assessment:   Pregnancy:  [id number] at 3172w5d  1. Supervision of normal intrauterine pregnancy in multigravida in third trimester  - POCT urinalysis dipstick - US OB Follow Up; Future  2. Left ear pain  3. Frontal sinus  pain  4. Gestational diabetes mellitus (GDM) in third trimester, gestational diabetes method of control unspecified  - US OB Follow Up; Future  Plan:   Encouraged pt to log blood sugars daily and bring log to each midwife appointment.   Discussed use of oral hyperglycemic agents during pregnancy. Advised starting Glyburide 2.5 mg nightly for persistent elevated fasting glucose levels, pt declines at this time.   Gestational diabetes education sheet reviewed as well as smart snacking options.   Reviewed red flag symptoms and when to call.   Follow up in 2 weeks for Growth/AFI and ROB with Pattricia BossAnnie or sooner if needed.   Gunnar BullaJenkins Michelle Arabella Revelle, CNM

## 2017-02-21 ENCOUNTER — Ambulatory Visit (INDEPENDENT_AMBULATORY_CARE_PROVIDER_SITE_OTHER): Payer: BLUE CROSS/BLUE SHIELD | Admitting: Certified Nurse Midwife

## 2017-02-21 ENCOUNTER — Ambulatory Visit (INDEPENDENT_AMBULATORY_CARE_PROVIDER_SITE_OTHER): Payer: BLUE CROSS/BLUE SHIELD

## 2017-02-21 ENCOUNTER — Encounter: Payer: Self-pay | Admitting: Certified Nurse Midwife

## 2017-02-21 VITALS — BP 111/69 | HR 77 | Wt 250.4 lb

## 2017-02-21 DIAGNOSIS — O4103X0 Oligohydramnios, third trimester, not applicable or unspecified: Secondary | ICD-10-CM

## 2017-02-21 DIAGNOSIS — O24419 Gestational diabetes mellitus in pregnancy, unspecified control: Secondary | ICD-10-CM | POA: Diagnosis not present

## 2017-02-21 DIAGNOSIS — Z3483 Encounter for supervision of other normal pregnancy, third trimester: Secondary | ICD-10-CM

## 2017-02-21 LAB — POCT URINALYSIS DIPSTICK
Blood, UA: NEGATIVE
Glucose, UA: NEGATIVE
Ketones, UA: NEGATIVE
Nitrite, UA: NEGATIVE
Spec Grav, UA: 1.02 (ref 1.010–1.025)
Urobilinogen, UA: 0.2 E.U./dL
pH, UA: 6 (ref 5.0–8.0)

## 2017-02-21 MED ORDER — GLYBURIDE 2.5 MG PO TABS: 2.5000 mg | ORAL_TABLET | Freq: Every day | ORAL | 0 refills | Status: DC

## 2017-02-21 NOTE — Progress Notes (Signed)
ROB-Pt doing well, forgot blood sugar log. Reports elevated fasting levels. Encouraged pt to send blood sugar log via MyChart. Discussed maternal and fetal risks of uncontrolled gestational diabetes. Pt and spouse verbalized understanding. Agrees to send log via MyChart and start Glyburide 2.5mg  qHS.   Growth US and AFI today, findings reviewed with patient and spouse. Growth WNL, oligohydramnios. Education regarding preterm oligohydramnios and management options, pt and spouse verbalized understanding.   NST performed today was reviewed and was found to be reactive.  Baseline 120-125 with moderate variability, no decels noted. BPP: 8/10.  Dr Valentino Saxonherry consulted and agrees with plan of care. Reviewed red flag symptoms and when to call. Encouraged kick counts, rest, and hydration.   Discussed red flag symptoms and when to call. RTC x 1 weeks for BPP/NST and ROB with Dr Valentino Saxonherry to discuss TOLAC, then back to midwifery service for care.   ULTRASOUND REPORT  Location: [facility] Date of Service: 02/21/17  Indications: Growth and AFI for GDM Findings:  Singleton intrauterine pregnancy is visualized with FHR at 147 BPM. Biometrics give an (U/S) Gestational age of [redacted] weeks 5 days, and an (U/S) EDD of 04/06/17; this correlates with the clinically established EDD of 04/06/17.  Fetal presentation is vertex, spine left lateral.  EFW: 2251 grams ( 4 lbs. 15 oz.) 44th percentile, Williams. Placenta: Anterior, grade 2. AFI: OLIGOHYDRAMNIOS at 5.3 cm.  Anatomic survey of the fetal stomach, bladder, kidneys, and lateral ventricle appear WNL. Gender - Female.  Unofficial BPP is 6/8 (for oligohydramnios).    Impression: 1. 33 week 5 day Viable Singleton Intrauterine pregnancy by U/S. 2. (U/S) EDD is consistent with Clinically established (LMP) EDD of 04/06/17. 3. EFW: 2251 grams ( 4 lbs. 15 oz.) 44th percentile for growth per Mayford KnifeWilliams. 4. AFI: 5.3 cm  (OLIGOHYDRAMNIOS).  Recommendations: 1.Clinical correlation with the patient's History and Physical Exam.

## 2017-02-21 NOTE — Patient Instructions (Addendum)
Oligohydramnios Oligohydramnios is a conditionin which there is not enough fluid in the sac (amniotic sac) that surrounds your unborn baby (fetus). The amniotic sac in the uterus contains fluid (amniotic fluid) that:  Protects your baby from injury (trauma) and infections.  Helps your baby move freely inside the uterus.  Helps your baby's lungs, kidneys, and digestive system to develop.  This condition occurs before birth (is a prenatal condition), and it can interfere with your baby's normal prenatal growth and development. Oligohydramnios can happen any time during pregnancy, and it most commonly occurs during the last 3 months (third trimester). It is most likely to cause serious complications when it occurs early in pregnancy. Some possible complications of this condition include:  Early (premature) birth.  Birth defects.  Limited (restricted) fetal growth.  Decreased oxygen flow to the fetus due to pressure on the umbilical cord.  Pregnancy loss (miscarriage).  Stillbirth.  What are the causes? This condition may be caused by:  A leak or tear in the amniotic sac.  A problem with the organ that nourishes the baby in the uterus (placenta), such as failure of the placenta to provide enough blood, fluid, and nutrients to the baby.  Having identical twins who share the same placenta.  A fetal birth defect. This is usually an abnormality in the fetal kidneys or urinary tract.  A pregnancy that goes past the due date.  A condition that the mother has, such as: ? A lack of fluids in the body (dehydration). ? High blood pressure. ? Diabetes. ? Reactions to certain medicines, such as ibuprofen or blood pressure medicines (ACE inhibitors). ? Systemic lupus.  In some cases, the cause is unknown. What increases the risk? This condition is more likely to develop if you:  Become dehydrated.  Have high blood pressure.  Take NSAIDs or ACE inhibitors.  Have diabetes or  lupus.  Have poor prenatal care.  What are the signs or symptoms? In most cases, there are no symptoms of oligohydramnios. If you do have symptoms, they may include:  Fluid leaking from the vagina.  Having a uterus that is smaller than normal.  Feeling less movement of your baby in your uterus.  How is this diagnosed? This condition may be diagnosed by measuring the amount of amniotic fluid in your amniotic sac using the amniotic fluid index (AFI). The AFI is a prenatal ultrasound test that uses painless, harmless sound waves to create an image of your uterus and your baby. An AFI prenatal ultrasound test may be done:  To measure the amniotic fluid level.  To check your baby's kidneys and your baby's growth.  To evaluate the placenta.  You may also have other tests to find the cause of oligohydramnios. How is this treated? Treatment for this condition depends on how low your amniotic fluid level is, how far along you are in your pregnancy, and your overall health. Treatment may include:  Having your health care provider monitor your condition more closely than usual. You may have more frequent appointments and more AFI ultrasound measurements.  Increasing the amount of fluid in your body. This may be done by having you drink more fluids, or by giving you fluids through an IV tube that is inserted into one of your veins.  Injecting fluid into your amniotic sac during delivery (amnioinfusion).  Having your baby delivered early, if you are close to your due date.  Follow these instructions at home: Lifestyle  Do not drink alcohol. No safe  level of alcohol consumption during pregnancy has been determined.  Do not use any tobacco products, such as cigarettes, chewing tobacco, and e-cigarettes. If you need help quitting, ask your health care provider.  Do not use any illegal drugs. These can harm your developing baby or cause a miscarriage. General instructions  Take  over-the-counter and prescription medicines only as told by your health care provider.  Follow instructions from your health care provider about physical activity and rest. Your health care provider may recommend that you stay in bed (be on bed rest).  Follow instructions from your health care provider about eating or drinking restrictions.  Eat healthy foods.  Drink enough fluids to keep your urine clear or pale yellow.  Keep all prenatal care appointments with your health care provider. This is important. Contact a health care provider if:  You notice that your baby seems to be moving less than usual. Get help right away if:  You have fluid leaking from your vagina.  You start to have labor pains (contractions). This may feel like a sense of tightening in your lower abdomen.  You have a fever. This information is not intended to replace advice given to you by your health care provider. Make sure you discuss any questions you have with your health care provider. Document Released: 11/17/2010 Document Revised: 01/05/2016 Document Reviewed: 02/12/2015 Elsevier Interactive Patient Education  2018 [employer] Glyburide tablets What is this medicine? GLYBURIDE (GLYE byoor ide) helps to treat type 2 diabetes. Treatment is combined with diet and exercise. The medicine helps your body to use insulin better. This medicine may be used for other purposes; ask your health care provider or pharmacist if you have questions. COMMON BRAND NAME(S): Diabeta, Glycron, Glynase PresTab, Micronase What should I tell my health care provider before I take this medicine? They need to know if you have any of these conditions: -diabetic ketoacidosis -glucose-6-phosphate dehydrogenase deficiency -heart disease -kidney disease -liver disease -severe infection or injury -thyroid disease -an unusual or allergic reaction to glyburide, sulfa drugs, other medicines, foods, dyes, or preservatives -pregnant  or trying to get pregnant -breast-feeding How should I use this medicine? Take this medicine by mouth with a glass of water. Follow the directions on the prescription label. If you take this medicine once a day, take it with breakfast or the first main meal of the day. Take your medicine at the same time each day. Do not take more often than directed. Talk to your pediatrician regarding the use of this medicine in children. Special care may be needed. Elderly patients over 38 years old may have a stronger reaction and need a smaller dose. Overdosage: If you think you have taken too much of this medicine contact a poison control center or emergency room at once. NOTE: This medicine is only for you. Do not share this medicine with others. What if I miss a dose? If you miss a dose, take it as soon as you can. If it is almost time for your next dose, take only that dose. Do not take double or extra doses. What may interact with this medicine? -bosentan -chloramphenicol -cisapride -clarithromycin -medicines for fungal or yeast infections -metoclopramide -probenecid -rifampin -warfarin Many medications may cause an increase or decrease in blood sugar, these include: -alcohol containing beverages -angiotensin converting enzyme inhibitors like enalapril, captopril, and lisinopril -aspirin and aspirin-like drugs -chloramphenicol -chromium -female hormones, like estrogens or progestins and birth control pills -fluoxetine -heart medicines like disopyramide -isoniazid -female hormones or  anabolic steroids -medicines called MAO Inhibitors like Nardil, Parnate, Marplan, Eldepryl -medicines for allergies, asthma, cold, or cough -medicines for mental problems -medicines for weight loss -niacin -NSAIDs, medicines for pain and inflammation, like ibuprofen or naproxen -pentamidine -phenytoin -probenecid -quinolone antibiotics like ciprofloxacin, levofloxacin, ofloxacin -some herbal dietary  supplements -steroid medicines like prednisone or cortisone -thyroid medicine -water pills or diuretics This list may not describe all possible interactions. Give your health care provider a list of all the medicines, herbs, non-prescription drugs, or dietary supplements you use. Also tell them if you smoke, drink alcohol, or use illegal drugs. Some items may interact with your medicine. What should I watch for while using this medicine? Visit your doctor or health care professional for regular checks on your progress. A test called the HbA1C (A1C) will be monitored. This is a simple blood test. It measures your blood sugar control over the last 2 to 3 months. You will receive this test every 3 to 6 months. Learn how to check your blood sugar. Learn the symptoms of low and high blood sugar and how to manage them. Always carry a quick-source of sugar with you in case you have symptoms of low blood sugar. Examples include hard sugar candy or glucose tablets. Make sure others know that you can choke if you eat or drink when you develop serious symptoms of low blood sugar, such as seizures or unconsciousness. They must get medical help at once. Tell your doctor or health care professional if you have high blood sugar. You might need to change the dose of your medicine. If you are sick or exercising more than usual, you might need to change the dose of your medicine. Do not skip meals. Ask your doctor or health care professional if you should avoid alcohol. Many nonprescription cough and cold products contain sugar or alcohol. These can affect blood sugar. This medicine can make you more sensitive to the sun. Keep out of the sun. If you cannot avoid being in the sun, wear protective clothing and use sunscreen. Do not use sun lamps or tanning beds/booths. Wear a medical ID bracelet or chain, and carry a card that describes your disease and details of your medicine and dosage times. What side effects may I  notice from receiving this medicine? Side effects that you should report to your doctor or health care professional as soon as possible: -allergic reactions like skin rash, itching or hives, swelling of the face, lips, or tongue -breathing problems -dark urine -fever, chills, sore throat -signs and symptoms of low blood sugar such as feeling anxious, confusion, dizziness, increased hunger, unusually weak or tired, sweating, shakiness, cold, irritable, headache, blurred vision, fast heartbeat, loss of consciousness -unusual bleeding or bruising -yellowing of the eyes or skin Side effects that usually do not require medical attention (report to your doctor or health care professional if they continue or are bothersome): -diarrhea -dizziness -headache -heartburn -nausea -stomach gas This list may not describe all possible side effects. Call your doctor for medical advice about side effects. You may report side effects to FDA at 1-800-FDA-1088. Where should I keep my medicine? Keep out of the reach of children. Store at room temperature between 15 and 30 degrees C (59 and 86 degrees F). Throw away any unused medicine after the expiration date. NOTE: This sheet is a summary. It may not cover all possible information. If you have questions about this medicine, talk to your doctor, pharmacist, or health care provider.  2018 Elsevier/Gold  Standard (2012-11-15 14:44:31)  Fetal Movement Counts Patient Name: ________________________________________________ Patient Due Date: ____________________ What is a fetal movement count? A fetal movement count is the number of times that you feel your baby move during a certain amount of time. This may also be called a fetal kick count. A fetal movement count is recommended for every pregnant woman. You may be asked to start counting fetal movements as early as week 28 of your pregnancy. Pay attention to when your baby is most active. You may notice your baby's  sleep and wake cycles. You may also notice things that make your baby move more. You should do a fetal movement count:  When your baby is normally most active.  At the same time each day.  A good time to count movements is while you are resting, after having something to eat and drink. How do I count fetal movements? 1. Find a quiet, comfortable area. Sit, or lie down on your side. 2. Write down the date, the start time and stop time, and the number of movements that you felt between those two times. Take this information with you to your health care visits. 3. For 2 hours, count kicks, flutters, swishes, rolls, and jabs. You should feel at least 10 movements during 2 hours. 4. You may stop counting after you have felt 10 movements. 5. If you do not feel 10 movements in 2 hours, have something to eat and drink. Then, keep resting and counting for 1 hour. If you feel at least 4 movements during that hour, you may stop counting. Contact a health care provider if:  You feel fewer than 4 movements in 2 hours.  Your baby is not moving like he or she usually does. Date: ____________ Start time: ____________ Stop time: ____________ Movements: ____________ Date: ____________ Start time: ____________ Stop time: ____________ Movements: ____________ Date: ____________ Start time: ____________ Stop time: ____________ Movements: ____________ Date: ____________ Start time: ____________ Stop time: ____________ Movements: ____________ Date: ____________ Start time: ____________ Stop time: ____________ Movements: ____________ Date: ____________ Start time: ____________ Stop time: ____________ Movements: ____________ Date: ____________ Start time: ____________ Stop time: ____________ Movements: ____________ Date: ____________ Start time: ____________ Stop time: ____________ Movements: ____________ Date: ____________ Start time: ____________ Stop time: ____________ Movements: ____________ This information is  not intended to replace advice given to you by your health care provider. Make sure you discuss any questions you have with your health care provider. Document Released: 09/01/2006 Document Revised: 03/31/2016 Document Reviewed: 09/11/2015 Elsevier Interactive Patient Education  [employer].

## 2017-02-21 NOTE — Progress Notes (Signed)
ROB- no complaints. BSL- did not bring. FBS per pt 100.

## 2017-02-23 ENCOUNTER — Telehealth: Payer: Self-pay | Admitting: Certified Nurse Midwife

## 2017-02-23 NOTE — Telephone Encounter (Signed)
Patient is concerned - she has had a lot of cramping and a few contractions this am  She wants to know if she needs to be seen  Please call

## 2017-02-24 NOTE — Telephone Encounter (Signed)
Might be associated with "stomach bug" that is going around. Feel free to come in for evaluation in contractions persist. Encourage hydration and fetal kick counts. Thanks, JML

## 2017-02-24 NOTE — Telephone Encounter (Signed)
For some reason, Angie, sent me this message yesterday when I was out of the office. Would you please contact the patient and see how she's doing. Thanks, JML

## 2017-02-24 NOTE — Telephone Encounter (Signed)
Some contractions in back and pelvic area yesterday.  Loose bowels yesterday. Done well through the night and so far today. Just a few braxton hicks. Pelvic pressure a lot less. Thinks this may be related to bowels being upset. There have been some cases of this in her home. Is there any reason to be concerned about contraction times especially since she has low fluid. When they are not  significant to her, they may be significant. She does not feel the pain as most would as per her history.

## 2017-02-25 NOTE — Telephone Encounter (Signed)
Left message as to what JML relayed. Pt was encouraged to call if contractions persist as previously spoke about or if she is concerned to be seen.

## 2017-03-03 ENCOUNTER — Encounter: Payer: Self-pay | Admitting: Obstetrics and Gynecology

## 2017-03-03 ENCOUNTER — Ambulatory Visit (INDEPENDENT_AMBULATORY_CARE_PROVIDER_SITE_OTHER): Payer: BLUE CROSS/BLUE SHIELD

## 2017-03-03 ENCOUNTER — Ambulatory Visit (INDEPENDENT_AMBULATORY_CARE_PROVIDER_SITE_OTHER): Payer: BLUE CROSS/BLUE SHIELD | Admitting: Obstetrics and Gynecology

## 2017-03-03 VITALS — BP 118/76 | HR 84 | Wt 253.1 lb

## 2017-03-03 DIAGNOSIS — O4103X0 Oligohydramnios, third trimester, not applicable or unspecified: Secondary | ICD-10-CM

## 2017-03-03 DIAGNOSIS — O24419 Gestational diabetes mellitus in pregnancy, unspecified control: Secondary | ICD-10-CM | POA: Diagnosis not present

## 2017-03-03 DIAGNOSIS — Z3483 Encounter for supervision of other normal pregnancy, third trimester: Secondary | ICD-10-CM

## 2017-03-03 DIAGNOSIS — O0993 Supervision of high risk pregnancy, unspecified, third trimester: Secondary | ICD-10-CM

## 2017-03-03 DIAGNOSIS — Z3685 Encounter for antenatal screening for Streptococcus B: Secondary | ICD-10-CM

## 2017-03-03 DIAGNOSIS — Z113 Encounter for screening for infections with a predominantly sexual mode of transmission: Secondary | ICD-10-CM

## 2017-03-03 LAB — POCT URINALYSIS DIPSTICK
Bilirubin, UA: NEGATIVE
Blood, UA: NEGATIVE
Glucose, UA: NEGATIVE
Ketones, POC UA: NEGATIVE mg/dL
Nitrite, UA: NEGATIVE
Protein, UA: NEGATIVE
Spec Grav, UA: <=1.005 — AB (ref 1.010–1.025)
Urobilinogen, UA: 0.2 E.U./dL
pH, UA: 6.5 (ref 5.0–8.0)

## 2017-03-03 NOTE — Progress Notes (Signed)
ROB, BPP, and nst today. BSL- wnl per pt. Wants cervix check today.   NONSTRESS TEST INTERPRETATION  INDICATIONS: gdm, oligohydraminos, ama  FHR baseline: 150 RESULTS:Reactive COMMENTS: contractions q 8 minutes   PLAN: 1. Continue fetal kick counts twice a day. 2. Continue antepartum testing as scheduled-Biweekly    Darol Destinerystal Yehudah Standing, CMA

## 2017-03-04 NOTE — Progress Notes (Signed)
ROB: Patient notes stronger Bonnie Armstrong. Desires cervical check. Presents today for consultation for TOLAC.  Patient has a history of prior C-section 1 (due to fetal malposition), and successful VBAC 4. She also has a history of gestational diabetes currently on glyburide and oligohydramnios. Discussed risks versus benefits of TOLAC versus repeat C-section with patient, patient is a good candidate for another TOLAC. I also discussed with patient that due to her history of gestational diabetes pn meds she would require induction during her 39th week gestation.  Patient also with oligohydramnios, improved on today's scan (7.5 cm, increased from 5.3 cm last week). Continue to encourage aggressive hydration and tight control of her blood sugars. Patient did not bring blood glucose log today however notes that all of her sugars have been within normal limits. If patient remains oligohydramnios, we'll need to discuss earlier induction of labor (37 weeks) however will need to weigh risks versus benefits of earlier induction with less favorable cervix due to h/o C-section as cervical ripening methods are limited. Growth wnl today, noting 49th%ile. 36 week labs done today. RTC in 1 week with midwives. For repeat AFI and NST next visit.  NST performed today was reviewed and was found to be reactive.  Continue recommended antenatal testing and prenatal care. Discussed labor precautions.

## 2017-03-09 LAB — PLEASE NOTE

## 2017-03-09 LAB — GC/CHLAMYDIA PROBE AMP
Chlamydia trachomatis, NAA: NEGATIVE
Neisseria gonorrhoeae by PCR: NEGATIVE

## 2017-03-09 LAB — CULTURE, BETA STREP (GROUP B ONLY): Strep Gp B Culture: NEGATIVE

## 2017-03-10 ENCOUNTER — Ambulatory Visit (INDEPENDENT_AMBULATORY_CARE_PROVIDER_SITE_OTHER): Payer: BLUE CROSS/BLUE SHIELD

## 2017-03-10 ENCOUNTER — Other Ambulatory Visit: Payer: BLUE CROSS/BLUE SHIELD

## 2017-03-10 ENCOUNTER — Ambulatory Visit (INDEPENDENT_AMBULATORY_CARE_PROVIDER_SITE_OTHER): Payer: BLUE CROSS/BLUE SHIELD | Admitting: Certified Nurse Midwife

## 2017-03-10 VITALS — BP 136/82 | HR 86 | Wt 252.1 lb

## 2017-03-10 DIAGNOSIS — O0993 Supervision of high risk pregnancy, unspecified, third trimester: Secondary | ICD-10-CM

## 2017-03-10 DIAGNOSIS — O24419 Gestational diabetes mellitus in pregnancy, unspecified control: Secondary | ICD-10-CM | POA: Diagnosis not present

## 2017-03-10 DIAGNOSIS — O4103X0 Oligohydramnios, third trimester, not applicable or unspecified: Secondary | ICD-10-CM | POA: Diagnosis not present

## 2017-03-10 LAB — POCT URINALYSIS DIPSTICK
Bilirubin, UA: NEGATIVE
Glucose, UA: NEGATIVE
Ketones, UA: NEGATIVE
Leukocytes, UA: NEGATIVE
Nitrite, UA: NEGATIVE
Protein, UA: NEGATIVE
Spec Grav, UA: 1.01 (ref 1.010–1.025)
Urobilinogen, UA: 0.2 E.U./dL
pH, UA: 6.5 (ref 5.0–8.0)

## 2017-03-10 NOTE — Patient Instructions (Signed)

## 2017-03-10 NOTE — Progress Notes (Signed)
ROB-Pt doing well, presents with spouse. Reports blood sugars all within normal limits. Taking 2.5mg  Glyburide nightly. Advised to bring blood sugar log to each appointment. Encouraged to seen this week's log via MyChart. NST performed today was reviewed and was found to be reactive.  Baseline 140-150 bpm with moderate variability, accelerations present and no decelerations noted. AFI normal at 8.8 cm with fetal movement present on US. Continue twice daily kick counts. Reviewed red flag symptoms and when to call. RTC x 1 week for BPP/Growth US and ROB.   ULTRASOUND REPORT  Location: [facility] Date of Service: 03/10/17  Indications:AFI Findings:  Mason JimSingleton intrauterine pregnancy is visualized with FHR at 163 BPM.  Fetal presentation is Vertex with spine to maternal left. A Placenta: Anterior, grade1, remote to cervix. AFI: 8.8  Cm (5th percentile for 36 weeks is 7.7 cm). Fetal stomach and bladder are seen.  Impression: 1. AFI is lower limits of normal  Recommendations: 1.Clinical correlation with the patient's History and Physical Exam.

## 2017-03-11 ENCOUNTER — Other Ambulatory Visit: Payer: Self-pay | Admitting: Certified Nurse Midwife

## 2017-03-11 DIAGNOSIS — O24410 Gestational diabetes mellitus in pregnancy, diet controlled: Secondary | ICD-10-CM

## 2017-03-18 ENCOUNTER — Ambulatory Visit (INDEPENDENT_AMBULATORY_CARE_PROVIDER_SITE_OTHER): Payer: BLUE CROSS/BLUE SHIELD

## 2017-03-18 ENCOUNTER — Ambulatory Visit (INDEPENDENT_AMBULATORY_CARE_PROVIDER_SITE_OTHER): Payer: BLUE CROSS/BLUE SHIELD | Admitting: Certified Nurse Midwife

## 2017-03-18 ENCOUNTER — Observation Stay
Admission: EM | Admit: 2017-03-18 | Discharge: 2017-03-18 | Disposition: A | Discharge status: Home / Self Care | Payer: BLUE CROSS/BLUE SHIELD | Attending: Certified Nurse Midwife | Admitting: Certified Nurse Midwife

## 2017-03-18 ENCOUNTER — Encounter: Payer: Self-pay | Admitting: *Deleted

## 2017-03-18 VITALS — BP 131/93 | HR 78 | Wt 254.7 lb

## 2017-03-18 DIAGNOSIS — O4103X0 Oligohydramnios, third trimester, not applicable or unspecified: Secondary | ICD-10-CM | POA: Diagnosis present

## 2017-03-18 DIAGNOSIS — O24419 Gestational diabetes mellitus in pregnancy, unspecified control: Secondary | ICD-10-CM | POA: Diagnosis not present

## 2017-03-18 DIAGNOSIS — Z3A37 37 weeks gestation of pregnancy: Secondary | ICD-10-CM | POA: Diagnosis not present

## 2017-03-18 DIAGNOSIS — O24415 Gestational diabetes mellitus in pregnancy, controlled by oral hypoglycemic drugs: Secondary | ICD-10-CM

## 2017-03-18 DIAGNOSIS — O24410 Gestational diabetes mellitus in pregnancy, diet controlled: Secondary | ICD-10-CM | POA: Diagnosis not present

## 2017-03-18 DIAGNOSIS — O09523 Supervision of elderly multigravida, third trimester: Secondary | ICD-10-CM

## 2017-03-18 HISTORY — DX: Gestational diabetes mellitus in pregnancy, unspecified control: O24.419

## 2017-03-18 NOTE — Progress Notes (Signed)
ROB-Here today for EFW, AFI, and BPP due to diet controlled GDM, AMA, and oligohydramnios. BPP-2/8-fetal breathing movements present, but no movement or tone present. Discussed risks and benefit associated with continued pregnancy and induction of labor including fetal death, maternal death, and uterine rupture. Pt to L&D for prolonged monitoring. Advised patient to make appointment for NST in office on Tuesday and repeat NST and BPP on Friday. [facility] Dr. Logan BoresEvans aware of patient admission.   ULTRASOUND REPORT  Location: [facility] Date of Service: 03/18/17  Indications: EFW, AFI and BPP for Oligo and AMA Findings:  Singleton intrauterine pregnancy is visualized with FHR at 155 BPM. Biometrics give an (U/S) Gestational age of [redacted] weeks 4 days, and an (U/S) EDD of 04/11/17; this correlates with the clinically established EDD of 04/06/17.  Fetal presentation is vertex, spine maternal right.  EFW: 3098 grams ( 6 lbs. 13 oz) 48th percentile, Williams. Placenta: Anterior, grade 2 AFI: OLIGO at 6.6 cm which is at the 2.5th percentile for [redacted] weeks gestation.  Anatomic survey of the fetal lateral ventricle, stomach, bladder and kidneys appear WNL. Gender - Female.  BPP: 2/8:   No movement or tone and Oligo. Good fetal breathing movements are noted.   Impression:  1. 36 week 4 day Viable Singleton Intrauterine pregnancy by U/S. 2. (U/S) EDD is consistent with Clinically established (LMP) EDD of 04/06/17. 3. EFW: 3098 grams ( 6 lbs. 13 oz.) 48th percentile for growth, per Williams 4. Oligo at 6.6 cm with no 2 x 2 pocket visible on today's scan. 5. BPP: 2/8 for oligo and no fetal movements or tone.  Recommendations:  1.Clinical correlation with the patient's History and Physical Exam.

## 2017-03-18 NOTE — Patient Instructions (Signed)
Nonstress Test The nonstress test is a procedure that monitors the fetus's heartbeat. The test will monitor the heartbeat when the fetus is at rest and while the fetus is moving. In a healthy fetus, there will be an increase in fetal heart rate when the fetus moves or kicks. The heart rate will decrease at rest. This test helps determine if the fetus is healthy. Your health care provider will look at a number of patterns in the heart rate tracing to make sure your baby is thriving. If there is concern, your health care provider may order additional tests or may suggest another course of action. This test is often done in the third trimester and can help determine if an early delivery is needed and safe. Common reasons to have this test are:  You are past your due date.  You have a high-risk pregnancy.  You are feeling less movement than normal.  You have lost a pregnancy in the past.  Your health care provider suspects fetal growth problems.  You have too much or too little amniotic fluid.  What happens before the procedure?  Eat a meal right before the test or as directed by your health care provider. Food may help stimulate fetal movements.  Use the restroom right before the test. What happens during the procedure?  Two belts will be placed around your abdomen. These belts have monitors attached to them. One records the fetal heart rate and the other records uterine contractions.  You may be asked to lie down on your side or to stay sitting upright.  You may be given a button to press when you feel movement.  The fetal heartbeat is listened to and watched on a screen. The heartbeat is recorded on a sheet of paper.  If the fetus seems to be sleeping, you may be asked to drink some juice or soda, gently press your abdomen, or make some noise to wake the fetus. What happens after the procedure? Your health care provider will discuss the test results with you and make recommendations  for the near future.  This information is not intended to replace advice given to you by your health care provider. Make sure you discuss any questions you have with your health care provider. This information is not intended to replace advice given to you by your health care provider. Make sure you discuss any questions you have with your health care provider. Document Released: 07/23/2002 Document Revised: 07/02/2016 Document Reviewed: 09/05/2012 Elsevier Interactive Patient Education  2018 Elsevier Inc.  

## 2017-03-18 NOTE — OB Triage Note (Signed)
Sent over from [facility] for low AFI and BPP 2 out of 8. Elaina HoopsElks, Lindi Abram S

## 2017-03-22 ENCOUNTER — Other Ambulatory Visit: Payer: BLUE CROSS/BLUE SHIELD

## 2017-03-22 ENCOUNTER — Encounter: Payer: BLUE CROSS/BLUE SHIELD | Admitting: Certified Nurse Midwife

## 2017-03-22 ENCOUNTER — Observation Stay
Admission: RE | Admit: 2017-03-22 | Discharge: 2017-03-22 | Disposition: A | Discharge status: Home / Self Care | Payer: BLUE CROSS/BLUE SHIELD | Source: Ambulatory Visit | Attending: Obstetrics and Gynecology | Admitting: Obstetrics and Gynecology

## 2017-03-22 ENCOUNTER — Encounter: Payer: Self-pay | Admitting: Certified Nurse Midwife

## 2017-03-22 ENCOUNTER — Ambulatory Visit (INDEPENDENT_AMBULATORY_CARE_PROVIDER_SITE_OTHER): Payer: BLUE CROSS/BLUE SHIELD | Admitting: Certified Nurse Midwife

## 2017-03-22 ENCOUNTER — Other Ambulatory Visit: Payer: Self-pay | Admitting: Certified Nurse Midwife

## 2017-03-22 VITALS — BP 115/76 | HR 78 | Wt 252.9 lb

## 2017-03-22 DIAGNOSIS — O4103X0 Oligohydramnios, third trimester, not applicable or unspecified: Secondary | ICD-10-CM | POA: Diagnosis not present

## 2017-03-22 DIAGNOSIS — O09523 Supervision of elderly multigravida, third trimester: Secondary | ICD-10-CM

## 2017-03-22 DIAGNOSIS — O4100X0 Oligohydramnios, unspecified trimester, not applicable or unspecified: Secondary | ICD-10-CM

## 2017-03-22 DIAGNOSIS — Z3A37 37 weeks gestation of pregnancy: Secondary | ICD-10-CM | POA: Insufficient documentation

## 2017-03-22 DIAGNOSIS — O24415 Gestational diabetes mellitus in pregnancy, controlled by oral hypoglycemic drugs: Secondary | ICD-10-CM | POA: Diagnosis not present

## 2017-03-22 LAB — POCT URINALYSIS DIPSTICK
Bilirubin, UA: NEGATIVE
Glucose, UA: NEGATIVE
Ketones, UA: NEGATIVE
Nitrite, UA: NEGATIVE
Protein, UA: NEGATIVE
Spec Grav, UA: 1.025 (ref 1.010–1.025)
Urobilinogen, UA: 0.2 E.U./dL
pH, UA: 6 (ref 5.0–8.0)

## 2017-03-22 MED ORDER — ACETAMINOPHEN 325 MG PO TABS: 650.0000 mg | ORAL_TABLET | ORAL | Status: DC | PRN

## 2017-03-22 MED ORDER — PRENATAL MULTIVITAMIN CH: 1.0000 | ORAL_TABLET | Freq: Every day | ORAL | Status: DC

## 2017-03-22 NOTE — Discharge Summary (Signed)
Obstetric Discharge Summary  Patient ID: Bonnie GangChristen L Gaccione MRN: [id number] DOB/AGE: 38-May-1982 38 y.o.   Date of Admission: 03/18/2017 Serafina RoyalsMichelle Knute Mazzuca, CNM [facility](D. Evans, MD)  Date of Discharge: 03/18/2017 Serafina RoyalsMichelle Stephanie Littman, CNM [facility](D. Evans, MD)  Admitting Diagnosis: Observation at 6026w6d  Secondary Diagnosis: Gestational diabetes medication controlled (A2) and low amniotic fluid    Discharge Diagnosis: No other diagnosis   Antepartum Procedures: NST   Brief Hospital Course   L&D OB Triage Note   Subjective:  Bonnie Armstrong is a 36 y.o. [id number] female at 5626w6d, EDD Estimated Date of Delivery: 04/06/17 who presented to triage for prolong fetal monitoring after BPP: 2/8. Reports braxton hicks contractions. Denies vaginal bleeding and leakage of fluid. Good fetal movement.   Denies difficulty breathing or respiratory distress, chest pain, abdominal pain, dysuria, and leg pain or swelling.   Objective:  Pulse Rate:  [78] 78 (08/07 1144) BP: (115)/(76) 115/76 (08/07 1144) Weight:  [252 lb 14.4 oz (114.7 kg)] 252 lb 14.4 oz (114.7 kg) (08/07 1144)   General: Alert and oriented x 4, no apparent distress.   Heart: Regular rate and rhythm.   Lungs: Clear to auscultation bilaterally.   Breast: Deferred, no complaints.   Abdomen: soft, gravid, obese, non-tender.   Lower extremity: negative homans sign, no edema.   Fetal Monitoring:   Mode: External Baseline Rate (A): 140 bpm Variability: Moderate Accelerations: 15 x 15 Decelerations: None   Contraction Frequency (min): 7+   Dilation: 4 Effacement (%): 60 Station: -3 Presentation: Vertex Exam by:: Christiaan Strebeck  Assessment:  Bonnie GangChristen L Zumwalt is a 36 y.o. [id number] female at 1726w6d, GDM-diet controlled, low amniotic fluid, here for prolonged fetal monitoring  FHT Category I-Reactive  Plan:   NST performed was reviewed and was found to be reactive. Continue twice daily kick counts and antenatal testing as prescribed. She was  discharged home with bleeding/labor precautions.  Continue routine prenatal care. Follow up with CNM as previously scheduled.   Discharge Instructions: Per After Visit Summary.  Activity: Refer to After Visit Summary  Diet: Diabetic  Medications:  Allergies as of 03/18/2017   No Known Allergies     Medication List    ASK your doctor about these medications   glyBURIDE 2.5 MG tablet Commonly known as:  DIABETA Take 1 tablet (2.5 mg total) by mouth at bedtime.   prenatal multivitamin Tabs tablet Take 1 tablet by mouth daily at 12 noon.      Outpatient follow up:  Follow-up Information    Gunnar BullaLawhorn, Amarionna Arca Michelle, CNM Follow up.   Specialties:  Certified Nurse Midwife, Obstetrics and Gynecology, Radiology Why:  Follow up at regular scheduled appointment. NST next week BPP next week Contact information: [address] [place] [address] [phone]           Discharged Condition: stable  Discharged to: home    Serafina RoyalsMichelle Marvene Strohm, [place]

## 2017-03-22 NOTE — OB Triage Note (Signed)
Pt sent from office for prolonged fetal monitoring for non-reactive NST in office. Pt hx significant of gest. Diabetes, AMA, oligo, previous c/s with 4 successful VBACS. Pt not c/o any pain, bleeding, or LOF. Reports positive fetal movement. Will coninur to monitor.

## 2017-03-22 NOTE — Progress Notes (Signed)
   L&D OB Triage Note  SUBJECTIVE Bonnie Armstrong is a 36 y.o. [id number] female at 6096w6d, EDD Estimated Date of Delivery: 04/06/17 who presented to triage for prolonged fetal monitoring. She was seen in the office today for NST for GDM and oligohydramnios. The NST at the office was non reactive. PT sent to hospital for prolonged monitoring.   Obstetric History   G7   P5   T4   P1   A1   L5    SAB1   TAB0   Ectopic0   Multiple0   Live Births5     # Outcome Date GA Lbr Len/2nd Weight Sex Delivery Anes PTL Lv  7 Current           6 Term 12/15/15 6440w0d  7 lb 6 oz (3.345 kg) M Vag-Spont   LIV  5 Term 12/19/07 10440w0d  8 lb 6 oz (3.799 kg) M Vag-Spont   LIV  4 SAB 2007          3 Term 02/20/05 6340w0d  7 lb 15 oz (3.6 kg) M Vag-Spont   LIV  2 Preterm 04/05/02 6777w0d  3 lb (1.361 kg) M Vag-Spont   LIV  1 Term 05/23/01 4240w0d  6 lb 15 oz (3.147 kg) F CS-LTranv   LIV      No prescriptions prior to admission.     OBJECTIVE  Nursing Evaluation:   BP 133/70   Pulse 85   Temp 98.6 F (37 C) (Oral)   Resp 18   Ht 5\' 4"  (1.626 m)   Wt 252 lb (114.3 kg)   LMP 06/30/2016 (Approximate)   SpO2 98%   BMI 43.26 kg/m    Findings: Reactive NST/reasurring fetal tracing Category 1 strip.  NST was performed and has been reviewed by me.  NST INTERPRETATION: Category I  Mode: External Baseline Rate (A): 145 bpm Variability: Moderate Accelerations: 15 x 15 Decelerations: None     Contraction Frequency (min): irregular    ASSESSMENT Impression:  1. Pregnancy:  [id number] at 7196w6d , EDD Estimated Date of Delivery: 04/06/17 2.  reactive  PLAN 1. Reassurance given 2. Discharge home with precautions to return to L&D or call the office if:  increased leakage or fluid, contractions more than  6 per  1 hour, decreased fetal movement, bleeding from vaginal area or persistent headache  3. Continue routine prenatal care and antenatal testing as scheduled.  Doreene BurkeAnnie Neamiah Sciarra, CNM

## 2017-03-22 NOTE — Progress Notes (Signed)
Pt on the monitors for a little over 2 hrs. Reactive strip with accelerations. Monitors removed from patient. Reviewed discharge instructions with patient and gave opportunity for questions. Pt voiced understanding and discharged home.

## 2017-03-22 NOTE — Progress Notes (Signed)
NONSTRESS TEST INTERPRETATION  INDICATIONS: GDM, Oligohydraminos, AMA  FHR baseline: 130-140 bpm RESULTS:Non-reactive COMMENTS:    PLAN: 1. Patient to labor and delivery for prolonged fetal monitoring.   Fenton Mallingebbie Jcion Buddenhagen, LPN

## 2017-03-24 NOTE — Progress Notes (Signed)
I have reviewed the record and concur with patient management and plan.    Jenkins Michelle Lawhorn, CNM [facility], [facility] 

## 2017-03-24 NOTE — Patient Instructions (Signed)

## 2017-03-25 ENCOUNTER — Telehealth: Payer: Self-pay | Admitting: Certified Nurse Midwife

## 2017-03-25 ENCOUNTER — Ambulatory Visit: Payer: BLUE CROSS/BLUE SHIELD

## 2017-03-25 ENCOUNTER — Encounter: Payer: Self-pay | Admitting: Certified Nurse Midwife

## 2017-03-25 ENCOUNTER — Other Ambulatory Visit: Payer: Self-pay | Admitting: Certified Nurse Midwife

## 2017-03-25 ENCOUNTER — Ambulatory Visit (INDEPENDENT_AMBULATORY_CARE_PROVIDER_SITE_OTHER): Payer: BLUE CROSS/BLUE SHIELD | Admitting: Certified Nurse Midwife

## 2017-03-25 VITALS — BP 116/73 | HR 104 | Wt 255.1 lb

## 2017-03-25 DIAGNOSIS — Z369 Encounter for antenatal screening, unspecified: Secondary | ICD-10-CM

## 2017-03-25 DIAGNOSIS — O4100X0 Oligohydramnios, unspecified trimester, not applicable or unspecified: Secondary | ICD-10-CM

## 2017-03-25 DIAGNOSIS — O09523 Supervision of elderly multigravida, third trimester: Secondary | ICD-10-CM

## 2017-03-25 DIAGNOSIS — O4103X0 Oligohydramnios, third trimester, not applicable or unspecified: Secondary | ICD-10-CM

## 2017-03-25 LAB — POCT URINALYSIS DIPSTICK
Bilirubin, UA: NEGATIVE
Blood, UA: NEGATIVE
Glucose, UA: NEGATIVE
Ketones, UA: NEGATIVE
Nitrite, UA: NEGATIVE
Protein, UA: NEGATIVE
Spec Grav, UA: 1.02 (ref 1.010–1.025)
Urobilinogen, UA: 0.2 E.U./dL
pH, UA: 6 (ref 5.0–8.0)

## 2017-03-25 MED ORDER — GLYBURIDE 2.5 MG PO TABS: 2.5000 mg | ORAL_TABLET | Freq: Every day | ORAL | 0 refills | Status: DC

## 2017-03-25 NOTE — Telephone Encounter (Signed)
Pt needs a refill of glyburide. Refill erx.

## 2017-03-25 NOTE — Telephone Encounter (Signed)
I called the patient after her appointment due to me having to call the hospital and figure out an appointment for the patient on the u/s schedule that we did not have. The patient also asked an additional question about her medication glyBURIDE (DIABETA) 2.5 MG tablet. The patient needs to know id she still needs the medication, I called back and was told to send a message back. The patient would like a call back to discuss it. Please advise.

## 2017-03-25 NOTE — Patient Instructions (Signed)

## 2017-03-26 NOTE — Progress Notes (Signed)
ROB-Pt doing well. BPP normal today. Discussed risks and benefits associated with IOL verses expectanat management, pt verbalized understanding. Desires expected management at time time. Continue twice daily kick counts. Reviewed red flag symptoms and when to call. RTC x  4 days for NST. RTC x 1 weeks for BPP and ROB.   ULTRASOUND REPORT  Location: [facility] Date of Service:  03/25/17  Indications: BPP for Oligo Findings:  Singleton intrauterine pregnancy is visualized with FHR at 150 BPM.   Fetal presentation is vertex, spine right lateral.  Placenta: Anterior, grade 2/3. AFI: 7.9 cm. This is up from last week's AFI which was 6.6 cm. Today's AFI is just over the 5th percentile for [redacted] weeks gestation.  BPP: 7/8.  Fetal breathing: 2, Fetal movement: 2, Fetal Tone: 2 AFI: 1 (not severe Oligo, but still low).   Anatomic survey of the fetal stomach, bladder and left kidney appear WNL. Right renal artery was noted, however, due to gestational age and fetal position, a good image of the right kidney was not available. Gender - Female.     Impression: 1. BPP today was 7/8. Good movement and fetal breathing seen. 2. AFI today is increased from last week. Today's AFI is 7.9 cm, last week's AFI was 6.6 cm.   Recommendations: 1.Clinical correlation with the patient's History and Physical Exam.

## 2017-03-29 ENCOUNTER — Inpatient Hospital Stay
Admission: AD | Admit: 2017-03-29 | Discharge: 2017-03-29 | Disposition: A | Discharge status: Home / Self Care | Payer: BLUE CROSS/BLUE SHIELD | Source: Intra-hospital | Attending: Obstetrics and Gynecology | Admitting: Obstetrics and Gynecology

## 2017-03-29 DIAGNOSIS — Z3A38 38 weeks gestation of pregnancy: Secondary | ICD-10-CM | POA: Diagnosis not present

## 2017-03-29 DIAGNOSIS — O24419 Gestational diabetes mellitus in pregnancy, unspecified control: Secondary | ICD-10-CM | POA: Diagnosis present

## 2017-03-30 ENCOUNTER — Encounter: Payer: Self-pay | Admitting: Certified Nurse Midwife

## 2017-03-30 ENCOUNTER — Ambulatory Visit (INDEPENDENT_AMBULATORY_CARE_PROVIDER_SITE_OTHER): Payer: BLUE CROSS/BLUE SHIELD | Admitting: Certified Nurse Midwife

## 2017-03-30 ENCOUNTER — Ambulatory Visit (INDEPENDENT_AMBULATORY_CARE_PROVIDER_SITE_OTHER): Payer: BLUE CROSS/BLUE SHIELD

## 2017-03-30 ENCOUNTER — Other Ambulatory Visit: Payer: Self-pay | Admitting: Obstetrics and Gynecology

## 2017-03-30 VITALS — BP 129/81 | HR 93 | Wt 255.8 lb

## 2017-03-30 DIAGNOSIS — Z369 Encounter for antenatal screening, unspecified: Secondary | ICD-10-CM

## 2017-03-30 DIAGNOSIS — O4103X0 Oligohydramnios, third trimester, not applicable or unspecified: Secondary | ICD-10-CM

## 2017-03-30 DIAGNOSIS — O09523 Supervision of elderly multigravida, third trimester: Secondary | ICD-10-CM

## 2017-03-30 LAB — POCT URINALYSIS DIPSTICK
Bilirubin, UA: NEGATIVE
Blood, UA: NEGATIVE
Glucose, UA: NEGATIVE
Ketones, UA: NEGATIVE
Nitrite, UA: NEGATIVE
Protein, UA: NEGATIVE
Spec Grav, UA: 1.01 (ref 1.010–1.025)
Urobilinogen, UA: 0.2 E.U./dL
pH, UA: 6 (ref 5.0–8.0)

## 2017-03-30 NOTE — Patient Instructions (Signed)

## 2017-03-30 NOTE — Progress Notes (Signed)
ROB doing well. Endorses good fetal movement. NST yesterday @ hospital . U/s today for afi- normal afi. Discussed induction due to GDM on medications, she agreed to plan. Scheduled for 04/06/17. Reviewed Red flag symptoms, encourage fetal kick counts. [facility], then induction on Wednesday 8/22.   Doreene BurkeAnnie Teller Wakefield, CNM  ULTRASOUND REPORT  Location: [facility] Date of Service: 03/30/17  Indications: BPP for Oligo Findings:  Singleton intrauterine pregnancy is visualized with FHR at 149 BPM.  Fetal presentation is vertex, spine right lateral.  Placenta: Anterior, grade 2. AFI: Is now above the 5th percentile for [redacted] weeks gestation and today measures 9.5 cm. ( Last AFI was 7.9 cm).  Anatomic survey of the fetal stomach, bladder and kidneys appears WNL, however, the stomach never appears very full. Today the stomach was seen at the beginning of the exam, but could not be seen at the end of the 30 minute exam. Gender - Female.   BPP: 8/8 for movement, tone, breathing and adequate AFI.   Impression: 1. BPP: 8/8. 2. Stomach was seen at the beginning of today's ultrasound, however, it did not appear very full and I was not able to see it again at the end of the 30 minute exam.  Recommendations: 1.Clinical correlation with the patient's History and Physical Exam. 2. Patient has a follow up appointment with Doreene BurkeAnnie Uchechukwu Dhawan, CNM at 1:45 pm today.  Revonda Humphreyeresa Elliott, RDMS, RVT

## 2017-04-03 ENCOUNTER — Encounter: Payer: Self-pay | Admitting: Certified Nurse Midwife

## 2017-04-04 ENCOUNTER — Observation Stay
Admission: EM | Admit: 2017-04-04 | Discharge: 2017-04-04 | Disposition: A | Discharge status: Home / Self Care | Payer: BLUE CROSS/BLUE SHIELD | Attending: Certified Nurse Midwife | Admitting: Certified Nurse Midwife

## 2017-04-04 ENCOUNTER — Telehealth: Payer: Self-pay | Admitting: Certified Nurse Midwife

## 2017-04-04 DIAGNOSIS — O34219 Maternal care for unspecified type scar from previous cesarean delivery: Secondary | ICD-10-CM | POA: Insufficient documentation

## 2017-04-04 DIAGNOSIS — O99213 Obesity complicating pregnancy, third trimester: Secondary | ICD-10-CM | POA: Diagnosis not present

## 2017-04-04 DIAGNOSIS — Z3A38 38 weeks gestation of pregnancy: Secondary | ICD-10-CM | POA: Diagnosis not present

## 2017-04-04 DIAGNOSIS — O09523 Supervision of elderly multigravida, third trimester: Secondary | ICD-10-CM | POA: Insufficient documentation

## 2017-04-04 DIAGNOSIS — O24415 Gestational diabetes mellitus in pregnancy, controlled by oral hypoglycemic drugs: Principal | ICD-10-CM | POA: Insufficient documentation

## 2017-04-04 NOTE — OB Triage Note (Signed)
Ms. Brackins here for scheduled NST

## 2017-04-04 NOTE — Discharge Instructions (Signed)

## 2017-04-04 NOTE — Telephone Encounter (Signed)
The patient called stating that she is having severe consistent back pain along with contractions. The patient would like to speak with Marcelino Duster as soon as possible to see if there is anything she needs to do, and have her questions and concerns answered quickly as possible. The patient did not disclose any other information. Please advise.

## 2017-04-04 NOTE — Telephone Encounter (Signed)
Pt states she is having severe back pain. Reactive nst this am in l&d. NO vb or lof. Pt aware if ctx are 5 minutes apart for an hour, vag bleeding, or lof she will need to go to ED. Pt voices understanding. Aware JML will reach out via my chart. She is aware JML is at [facility] delivering.

## 2017-04-06 ENCOUNTER — Inpatient Hospital Stay
Admission: RE | Admit: 2017-04-06 | Discharge: 2017-04-08 | DRG: 775 | Disposition: A | Discharge status: Home / Self Care | Payer: BLUE CROSS/BLUE SHIELD | Source: Intra-hospital | Attending: Certified Nurse Midwife | Admitting: Certified Nurse Midwife

## 2017-04-06 DIAGNOSIS — Z6790 Unspecified blood type, Rh positive: Secondary | ICD-10-CM

## 2017-04-06 DIAGNOSIS — O99214 Obesity complicating childbirth: Secondary | ICD-10-CM | POA: Diagnosis present

## 2017-04-06 DIAGNOSIS — O34211 Maternal care for low transverse scar from previous cesarean delivery: Secondary | ICD-10-CM | POA: Diagnosis present

## 2017-04-06 DIAGNOSIS — E669 Obesity, unspecified: Secondary | ICD-10-CM | POA: Diagnosis present

## 2017-04-06 DIAGNOSIS — Z3A40 40 weeks gestation of pregnancy: Secondary | ICD-10-CM

## 2017-04-06 DIAGNOSIS — Z6841 Body Mass Index (BMI) 40.0 and over, adult: Secondary | ICD-10-CM | POA: Diagnosis not present

## 2017-04-06 DIAGNOSIS — Z3490 Encounter for supervision of normal pregnancy, unspecified, unspecified trimester: Secondary | ICD-10-CM | POA: Diagnosis present

## 2017-04-06 DIAGNOSIS — O24425 Gestational diabetes mellitus in childbirth, controlled by oral hypoglycemic drugs: Principal | ICD-10-CM | POA: Diagnosis present

## 2017-04-06 LAB — TYPE AND SCREEN
ABO/RH(D): A POS
Antibody Screen: NEGATIVE

## 2017-04-06 LAB — CBC
HCT: 35.4 % (ref 35.0–47.0)
Hemoglobin: 11.9 g/dL — ABNORMAL LOW (ref 12.0–16.0)
MCH: 27.5 pg (ref 26.0–34.0)
MCHC: 33.5 g/dL (ref 32.0–36.0)
MCV: 82 fL (ref 80.0–100.0)
Platelets: 218 10*3/uL (ref 150–440)
RBC: 4.31 MIL/uL (ref 3.80–5.20)
RDW: 15.9 % — ABNORMAL HIGH (ref 11.5–14.5)
WBC: 10.6 10*3/uL (ref 3.6–11.0)

## 2017-04-06 LAB — GLUCOSE, CAPILLARY
Glucose-Capillary: 147 mg/dL — ABNORMAL HIGH (ref 65–99)
Glucose-Capillary: 126 mg/dL — ABNORMAL HIGH (ref 65–99)
Glucose-Capillary: 162 mg/dL — ABNORMAL HIGH (ref 65–99)

## 2017-04-06 LAB — COMPREHENSIVE METABOLIC PANEL
ALT: 11 U/L — ABNORMAL LOW (ref 14–54)
AST: 18 U/L (ref 15–41)
Albumin: 2.7 g/dL — ABNORMAL LOW (ref 3.5–5.0)
Alkaline Phosphatase: 98 U/L (ref 38–126)
Anion gap: 7 (ref 5–15)
BUN: 9 mg/dL (ref 6–20)
CO2: 23 mmol/L (ref 22–32)
Calcium: 8.4 mg/dL — ABNORMAL LOW (ref 8.9–10.3)
Chloride: 107 mmol/L (ref 101–111)
Creatinine, Ser: 0.56 mg/dL (ref 0.44–1.00)
GFR calc Af Amer: >60 mL/min (ref >60)
GFR calc non Af Amer: >60 mL/min (ref >60)
Glucose, Bld: 132 mg/dL — ABNORMAL HIGH (ref 65–99)
Potassium: 3.6 mmol/L (ref 3.5–5.1)
Sodium: 137 mmol/L (ref 135–145)
Total Bilirubin: 0.4 mg/dL (ref 0.3–1.2)
Total Protein: 6 g/dL — ABNORMAL LOW (ref 6.5–8.1)

## 2017-04-06 MED ORDER — LACTATED RINGERS IV SOLN: 500.0000 mL | INTRAVENOUS | Status: DC | PRN

## 2017-04-06 MED ORDER — BUTORPHANOL TARTRATE 2 MG/ML IJ SOLN: 1.0000 mg | INTRAMUSCULAR | Status: DC | PRN

## 2017-04-06 MED ORDER — PRENATAL MULTIVITAMIN CH
1.0000 | ORAL_TABLET | Freq: Every day | ORAL | Status: DC
  Filled 2017-04-06 (×2): qty 1

## 2017-04-06 MED ORDER — SOD CITRATE-CITRIC ACID 500-334 MG/5ML PO SOLN
30.0000 mL | ORAL | Status: DC | PRN
Start: 2017-04-06 — End: 2017-04-06

## 2017-04-06 MED ORDER — ZOLPIDEM TARTRATE 5 MG PO TABS: 5.0000 mg | ORAL_TABLET | Freq: Every evening | ORAL | Status: DC | PRN

## 2017-04-06 MED ORDER — SENNOSIDES-DOCUSATE SODIUM 8.6-50 MG PO TABS
2.0000 | ORAL_TABLET | ORAL | Status: DC
  Filled 2017-04-06 (×2): qty 2

## 2017-04-06 MED ORDER — ONDANSETRON HCL 4 MG PO TABS: 4.0000 mg | ORAL_TABLET | ORAL | Status: DC | PRN

## 2017-04-06 MED ORDER — OXYTOCIN 40 UNITS IN LACTATED RINGERS INFUSION - SIMPLE MED
2.5000 [IU]/h | INTRAVENOUS | Status: DC
  Administered 2017-04-06: 39.96 [IU]/h via INTRAVENOUS
  Administered 2017-04-06: 2.5 [IU]/h via INTRAVENOUS
  Filled 2017-04-06: qty 1000

## 2017-04-06 MED ORDER — LIDOCAINE HCL (PF) 1 % IJ SOLN: 30.0000 mL | INTRAMUSCULAR | Status: DC | PRN

## 2017-04-06 MED ORDER — LACTATED RINGERS IV SOLN: INTRAVENOUS | Status: DC

## 2017-04-06 MED ORDER — ACETAMINOPHEN 325 MG PO TABS: 650.0000 mg | ORAL_TABLET | ORAL | Status: DC | PRN

## 2017-04-06 MED ORDER — FLEET ENEMA 7-19 GM/118ML RE ENEM: 1.0000 | ENEMA | Freq: Every day | RECTAL | Status: DC | PRN

## 2017-04-06 MED ORDER — DIPHENHYDRAMINE HCL 25 MG PO CAPS: 25.0000 mg | ORAL_CAPSULE | Freq: Four times a day (QID) | ORAL | Status: DC | PRN

## 2017-04-06 MED ORDER — OXYCODONE-ACETAMINOPHEN 5-325 MG PO TABS: 2.0000 | ORAL_TABLET | ORAL | Status: DC | PRN

## 2017-04-06 MED ORDER — TETANUS-DIPHTH-ACELL PERTUSSIS 5-2.5-18.5 LF-MCG/0.5 IM SUSP: 0.5000 mL | Freq: Once | INTRAMUSCULAR | Status: DC

## 2017-04-06 MED ORDER — OXYTOCIN BOLUS FROM INFUSION
500.0000 mL | Freq: Once | INTRAVENOUS | Status: AC
  Administered 2017-04-06: 500 mL via INTRAVENOUS

## 2017-04-06 MED ORDER — DIBUCAINE 1 % RE OINT: 1.0000 "application " | TOPICAL_OINTMENT | RECTAL | Status: DC | PRN

## 2017-04-06 MED ORDER — SIMETHICONE 80 MG PO CHEW: 80.0000 mg | CHEWABLE_TABLET | ORAL | Status: DC | PRN

## 2017-04-06 MED ORDER — SODIUM CHLORIDE FLUSH 0.9 % IV SOLN
INTRAVENOUS | Status: AC
  Filled 2017-04-06: qty 10

## 2017-04-06 MED ORDER — IBUPROFEN 600 MG PO TABS
600.0000 mg | ORAL_TABLET | Freq: Four times a day (QID) | ORAL | Status: DC
  Administered 2017-04-06 – 2017-04-08 (×10): 600 mg via ORAL
  Filled 2017-04-06 (×9): qty 1

## 2017-04-06 MED ORDER — OXYCODONE-ACETAMINOPHEN 5-325 MG PO TABS: 1.0000 | ORAL_TABLET | ORAL | Status: DC | PRN

## 2017-04-06 MED ORDER — COCONUT OIL OIL: 1.0000 "application " | TOPICAL_OIL | Status: DC | PRN

## 2017-04-06 MED ORDER — ONDANSETRON HCL 4 MG/2ML IJ SOLN: 4.0000 mg | Freq: Four times a day (QID) | INTRAMUSCULAR | Status: DC | PRN

## 2017-04-06 MED ORDER — GLYBURIDE 2.5 MG PO TABS
2.5000 mg | ORAL_TABLET | Freq: Every day | ORAL | Status: DC
  Administered 2017-04-06: 2.5 mg via ORAL
  Filled 2017-04-06 (×2): qty 1

## 2017-04-06 MED ORDER — ONDANSETRON HCL 4 MG/2ML IJ SOLN: 4.0000 mg | INTRAMUSCULAR | Status: DC | PRN

## 2017-04-06 MED ORDER — BENZOCAINE-MENTHOL 20-0.5 % EX AERO
1.0000 "application " | INHALATION_SPRAY | CUTANEOUS | Status: DC | PRN
  Administered 2017-04-06: 1 via TOPICAL
  Filled 2017-04-06: qty 56

## 2017-04-06 MED ORDER — WITCH HAZEL-GLYCERIN EX PADS: 1.0000 "application " | MEDICATED_PAD | CUTANEOUS | Status: DC | PRN

## 2017-04-06 NOTE — H&P (Signed)
Obstetric History and Physical  Bonnie Armstrong is a 36 y.o. [id number] with IUP at [redacted]w[redacted]d presenting for induction of labor due to gestational diabetes requiring glyburide.   Patient states she has been having  irregular, every four (4) to five (5) minutes contractions, none vaginal bleeding, intact membranes, with active fetal movement.    Denies difficulty breathing or respiratory distress, chest pain, abdominal pain, and leg pain or swelling.   Prenatal Course  Source of Care: [facility], initial visit: 12 weeks, total visits: 14  Pregnancy complications or risks: gestational diabetes-oral medications, maternal obesity, grand multiparity  Prenatal labs and studies:  ABO, Rh: --/--/A POS (08/22 4540)  Antibody: NEG (08/22 0650)  Rubella: pending  Varicella: 1230 (02/09 0950)  RPR: Non Reactive (02/09 0950)   HBsAg: Negative (02/09 0950)   HIV: Non Reactive (02/09 0950)  GBS: Negative )07/20 1655)  1 hr Glucola: 182 (04/05 1014)  3 hr Glucose Tolerance: 123, 240, 217, 156  Genetic screening: Declined  Anatomy US: Normal (04/19 1003)  Past Medical History:  Diagnosis Date  . Gestational diabetes   . IFG (impaired fasting glucose)   . Morbid obesity (HCC) 02/28/2015  . Prediabetes     Past Surgical History:  Procedure Laterality Date  . CESAREAN SECTION  2002  . WISDOM TOOTH EXTRACTION      OB History  Gravida Para Term Preterm AB Living  7 5 4 1 1 5   SAB TAB Ectopic Multiple Live Births  1       5    # Outcome Date GA Lbr Len/2nd Weight Sex Delivery Anes PTL Lv  7 Current           6 Term 12/15/15 [redacted]w[redacted]d  7 lb 6 oz (3.345 kg) M Vag-Spont   LIV  5 Term 12/19/07 [redacted]w[redacted]d  8 lb 6 oz (3.799 kg) M Vag-Spont   LIV  4 SAB 2007          3 Term 02/20/05 [redacted]w[redacted]d  7 lb 15 oz (3.6 kg) M Vag-Spont   LIV  2 Preterm 04/05/02 [redacted]w[redacted]d  3 lb (1.361 kg) M Vag-Spont   LIV  1 Term 05/23/01 [redacted]w[redacted]d  6 lb 15 oz (3.147 kg) F CS-LTranv   LIV     Birth Comments: Baby stuck in birth cannal,  had STAT c/s       Social History   Social History  . Marital status: Married    Spouse name: N/A  . Number of children: N/A  . Years of education: N/A   Social History Main Topics  . Smoking status: Never Smoker  . Smokeless tobacco: Never Used  . Alcohol use No  . Drug use: No  . Sexual activity: Yes    Birth control/ protection: None   Other Topics Concern  . None   Social History Narrative  . None    Family History  Problem Relation Age of Onset  . Hypertension Mother   . Hyperlipidemia Mother   . Diabetes Maternal Grandfather   . COPD Paternal Grandmother   . Cancer Paternal Grandfather        colon    Prescriptions Prior to Admission  Medication Sig Dispense Refill Last Dose  . glyBURIDE (DIABETA) 2.5 MG tablet Take 1 tablet (2.5 mg total) by mouth at bedtime. 90 tablet 0 04/05/2017 at Unknown time  . Prenatal Vit-Fe Fumarate-FA (PRENATAL MULTIVITAMIN) TABS tablet Take 1 tablet by mouth daily at 12 noon.   04/05/2017 at Unknown time  No Known Allergies  Review of Systems: Negative except for what is mentioned in HPI.  Physical Exam:  BP 129/88   Pulse 98   Temp 98.2 F (36.8 C)   Resp 18   Ht 5\' 4"  (1.626 m)   Wt 255 lb (115.7 kg)   LMP 06/30/2016 (Approximate)   BMI 43.77 kg/m   GENERAL: Well-developed, well-nourished female in no acute distress.   LUNGS: Clear to auscultation bilaterally.   HEART: Regular rate and rhythm.  BREASTS: Deferred, no complaints.   ABDOMEN: Soft, nontender, nondistended, gravid, obese.  EXTREMITIES: Nontender, no edema, 2+ distal pulses.  Cervical Exam: Dilation: 5.5 Effacement (%): 70 Cervical Position: Middle Station: -2 Presentation: Vertex Exam by:: Serafina Royals CNM  FHT:  Baseline rate 145 bpm   Variability moderate  Accelerations present   Decelerations early  Contractions: Every two (2) to six (6) minutes   Pertinent Labs/Studies:   Results for orders placed or performed during the  hospital encounter of 04/06/17 (from the past 24 hour(s))  CBC     Status: Abnormal   Collection Time: 04/06/17  6:50 AM  Result Value Ref Range   WBC 10.6 3.6 - 11.0 K/uL   RBC 4.31 3.80 - 5.20 MIL/uL   Hemoglobin 11.9 (L) 12.0 - 16.0 g/dL   HCT 86.5 78.4 - 69.6 %   MCV 82.0 80.0 - 100.0 fL   MCH 27.5 26.0 - 34.0 pg   MCHC 33.5 32.0 - 36.0 g/dL   RDW 29.5 (H) 28.4 - 13.2 %   Platelets 218 150 - 440 K/uL  Type and screen [facility]     Status: None   Collection Time: 04/06/17  6:50 AM  Result Value Ref Range   ABO/RH(D) A POS    Antibody Screen NEG    Sample Expiration 04/09/2017   Comprehensive metabolic panel     Status: Abnormal   Collection Time: 04/06/17  6:50 AM  Result Value Ref Range   Sodium 137 135 - 145 mmol/L   Potassium 3.6 3.5 - 5.1 mmol/L   Chloride 107 101 - 111 mmol/L   CO2 23 22 - 32 mmol/L   Glucose, Bld 132 (H) 65 - 99 mg/dL   BUN 9 6 - 20 mg/dL   Creatinine, Ser 4.40 0.44 - 1.00 mg/dL   Calcium 8.4 (L) 8.9 - 10.3 mg/dL   Total Protein 6.0 (L) 6.5 - 8.1 g/dL   Albumin 2.7 (L) 3.5 - 5.0 g/dL   AST 18 15 - 41 U/L   ALT 11 (L) 14 - 54 U/L   Alkaline Phosphatase 98 38 - 126 U/L   Total Bilirubin 0.4 0.3 - 1.2 mg/dL   GFR calc non Af Amer >60 >60 mL/min   GFR calc Af Amer >60 >60 mL/min   Anion gap 7 5 - 15    Assessment : Bonnie Armstrong is a 36 y.o. [id number] at [redacted]w[redacted]d being admitted for induction of labor due to gestational diabetes requiring glyburide, previous c-section desires TOLAC, previous VBAC x 4, Rh positive, GBS negative  FHR Category I  Plan:  Admit to birthing suites for induction of labor, see orders. Will collect Rubella titer since not available in chart.  Options for induction reviewed with patient and spouse including associated risks and benefits, decision from [facility] with amniotome and nipple stimulation.   Reviewed red flag symptoms and when to call.   Dr. Valentino Saxon notified of admission and plan of care.  Diona Fanti, CNM [facility], [facility]

## 2017-04-07 LAB — COMPREHENSIVE METABOLIC PANEL
ALT: 13 U/L — ABNORMAL LOW (ref 14–54)
AST: 17 U/L (ref 15–41)
Albumin: 2.3 g/dL — ABNORMAL LOW (ref 3.5–5.0)
Alkaline Phosphatase: 78 U/L (ref 38–126)
Anion gap: 4 — ABNORMAL LOW (ref 5–15)
BUN: 10 mg/dL (ref 6–20)
CO2: 25 mmol/L (ref 22–32)
Calcium: 8.1 mg/dL — ABNORMAL LOW (ref 8.9–10.3)
Chloride: 108 mmol/L (ref 101–111)
Creatinine, Ser: 0.63 mg/dL (ref 0.44–1.00)
GFR calc Af Amer: >60 mL/min (ref >60)
GFR calc non Af Amer: >60 mL/min (ref >60)
Glucose, Bld: 100 mg/dL — ABNORMAL HIGH (ref 65–99)
Potassium: 3.6 mmol/L (ref 3.5–5.1)
Sodium: 137 mmol/L (ref 135–145)
Total Bilirubin: 0.3 mg/dL (ref 0.3–1.2)
Total Protein: 5.3 g/dL — ABNORMAL LOW (ref 6.5–8.1)

## 2017-04-07 LAB — CBC
HCT: 33 % — ABNORMAL LOW (ref 35.0–47.0)
Hemoglobin: 11.2 g/dL — ABNORMAL LOW (ref 12.0–16.0)
MCH: 28.1 pg (ref 26.0–34.0)
MCHC: 34 g/dL (ref 32.0–36.0)
MCV: 82.8 fL (ref 80.0–100.0)
Platelets: 202 10*3/uL (ref 150–440)
RBC: 3.99 MIL/uL (ref 3.80–5.20)
RDW: 16.6 % — ABNORMAL HIGH (ref 11.5–14.5)
WBC: 9.4 10*3/uL (ref 3.6–11.0)

## 2017-04-07 LAB — GLUCOSE, CAPILLARY
Glucose-Capillary: 100 mg/dL — ABNORMAL HIGH (ref 65–99)
Glucose-Capillary: 130 mg/dL — ABNORMAL HIGH (ref 65–99)

## 2017-04-07 LAB — RPR: RPR Ser Ql: NONREACTIVE

## 2017-04-07 LAB — MEASLES/MUMPS/RUBELLA IMMUNITY
Mumps IgG: 44.8 AU/mL (ref >10.9)
Rubella: 1.12 index (ref >0.99)
Rubeola IgG: 89.4 AU/mL (ref >29.9)

## 2017-04-07 NOTE — Plan of Care (Signed)
Problem: Physical Regulation: Goal: Ability to maintain clinical measurements within normal limits will improve Outcome: Progressing CBG at 0800 was 100.  Problem: Activity: Goal: Risk for activity intolerance will decrease Outcome: Completed/Met Date Met: 04/07/17 Ambulating around the room fine, may be DC today.

## 2017-04-08 LAB — VITAMIN D 25 HYDROXY (VIT D DEFICIENCY, FRACTURES): Vit D, 25-Hydroxy: 30.2 ng/mL (ref 30.0–100.0)

## 2017-04-08 LAB — GLUCOSE, CAPILLARY: Glucose-Capillary: 157 mg/dL — ABNORMAL HIGH (ref 65–99)

## 2017-04-08 NOTE — Discharge Instructions (Signed)
How to Take a Sitz Bath A sitz bath is a warm water bath that is taken while you are sitting down. The water should only come up to your hips and should cover your buttocks. Your health care provider may recommend a sitz bath to help you:  Clean the lower part of your body, including your genital area.  With itching.  With pain.  With sore muscles or muscles that tighten or spasm.  How to take a sitz bath Take 3-4 sitz baths per day or as told by your health care provider. 1. Partially fill a bathtub with warm water. You will only need the water to be deep enough to cover your hips and buttocks when you are sitting in it. 2. If your health care provider told you to put medicine in the water, follow the directions exactly. 3. Sit in the water and open the tub drain a little. 4. Turn on the warm water again to keep the tub at the correct level. Keep the water running constantly. 5. Soak in the water for 15-20 minutes or as told by your health care provider. 6. After the sitz bath, pat the affected area dry first. Do not rub it. 7. Be careful when you stand up after the sitz bath because you may feel dizzy.  Contact a health care provider if:  Your symptoms get worse. Do not continue with sitz baths if your symptoms get worse.  You have new symptoms. Do not continue with sitz baths until you talk with your health care provider. This information is not intended to replace advice given to you by your health care provider. Make sure you discuss any questions you have with your health care provider. Document Released: 04/24/2004 Document Revised: 12/31/2015 Document Reviewed: 07/31/2014 Elsevier Interactive Patient Education  2018 [employer].   Postpartum Depression and Baby Blues The postpartum period begins right after the birth of a baby. During this time, there is often a great amount of joy and excitement. It is also a time of many changes in the life of the parents. Regardless of  how many times a mother gives birth, each child brings new challenges and dynamics to the family. It is not unusual to have feelings of excitement along with confusing shifts in moods, emotions, and thoughts. All mothers are at risk of developing postpartum depression or the "baby blues." These mood changes can occur right after giving birth, or they may occur many months after giving birth. The baby blues or postpartum depression can be mild or severe. Additionally, postpartum depression can go away rather quickly, or it can be a long-term condition. What are the causes? Raised hormone levels and the rapid drop in those levels are thought to be a main cause of postpartum depression and the baby blues. A number of hormones change during and after pregnancy. Estrogen and progesterone usually decrease right after the delivery of your baby. The levels of thyroid hormone and various cortisol steroids also rapidly drop. Other factors that play a role in these mood changes include major life events and genetics. What increases the risk? If you have any of the following risks for the baby blues or postpartum depression, know what symptoms to watch out for during the postpartum period. Risk factors that may increase the likelihood of getting the baby blues or postpartum depression include:  Having a personal or family history of depression.  Having depression while being pregnant.  Having premenstrual mood issues or mood issues related to  oral contraceptives.  Having a lot of life stress.  Having marital conflict.  Lacking a social support network.  Having a baby with special needs.  Having health problems, such as diabetes.  What are the signs or symptoms? Symptoms of baby blues include:  Brief changes in mood, such as going from extreme happiness to sadness.  Decreased concentration.  Difficulty sleeping.  Crying spells, tearfulness.  Irritability.  Anxiety.  Symptoms of postpartum  depression typically begin within the first month after giving birth. These symptoms include:  Difficulty sleeping or excessive sleepiness.  Marked weight loss.  Agitation.  Feelings of worthlessness.  Lack of interest in activity or food.  Postpartum psychosis is a very serious condition and can be dangerous. Fortunately, it is rare. Displaying any of the following symptoms is cause for immediate medical attention. Symptoms of postpartum psychosis include:  Hallucinations and delusions.  Bizarre or disorganized behavior.  Confusion or disorientation.  How is this diagnosed? A diagnosis is made by an evaluation of your symptoms. There are no medical or lab tests that lead to a diagnosis, but there are various questionnaires that a health care provider may use to identify those with the baby blues, postpartum depression, or psychosis. Often, a screening tool called the [place] Postnatal Depression Scale is used to diagnose depression in the postpartum period. How is this treated? The baby blues usually goes away on its own in 1-2 weeks. Social support is often all that is needed. You will be encouraged to get adequate sleep and rest. Occasionally, you may be given medicines to help you sleep. Postpartum depression requires treatment because it can last several months or longer if it is not treated. Treatment may include individual or group therapy, medicine, or both to address any social, physiological, and psychological factors that may play a role in the depression. Regular exercise, a healthy diet, rest, and social support may also be strongly recommended. Postpartum psychosis is more serious and needs treatment right away. Hospitalization is often needed. Follow these instructions at home:  Get as much rest as you can. Nap when the baby sleeps.  Exercise regularly. Some women find yoga and walking to be beneficial.  Eat a balanced and nourishing diet.  Do little things that you  enjoy. Have a cup of tea, take a bubble bath, read your favorite magazine, or listen to your favorite music.  Avoid alcohol.  Ask for help with household chores, cooking, grocery shopping, or running errands as needed. Do not try to do everything.  Talk to people close to you about how you are feeling. Get support from your partner, family members, friends, or other new moms.  Try to stay positive in how you think. Think about the things you are grateful for.  Do not spend a lot of time alone.  Only take over-the-counter or prescription medicine as directed by your health care provider.  Keep all your postpartum appointments.  Let your health care provider know if you have any concerns. Contact a health care provider if: You are having a reaction to or problems with your medicine. Get help right away if:  You have suicidal feelings.  You think you may harm the baby or someone else. This information is not intended to replace advice given to you by your health care provider. Make sure you discuss any questions you have with your health care provider. Document Released: 05/06/2004 Document Revised: 01/08/2016 Document Reviewed: 05/14/2013 Elsevier Interactive Patient Education  2017 [employer].  Breastfeeding Deciding to breastfeed is one of the best choices you can make for you and your baby. A change in hormones during pregnancy causes your breast tissue to grow and increases the number and size of your milk ducts. These hormones also allow proteins, sugars, and fats from your blood supply to make breast milk in your milk-producing glands. Hormones prevent breast milk from being released before your baby is born as well as prompt milk flow after birth. Once breastfeeding has begun, thoughts of your baby, as well as his or her sucking or crying, can stimulate the release of milk from your milk-producing glands. Benefits of breastfeeding For Your Baby  Your first milk (colostrum)  helps your baby's digestive system function better.  There are antibodies in your milk that help your baby fight off infections.  Your baby has a lower incidence of asthma, allergies, and sudden infant death syndrome.  The nutrients in breast milk are better for your baby than infant formulas and are designed uniquely for your babys needs.  Breast milk improves your baby's brain development.  Your baby is less likely to develop other conditions, such as childhood obesity, asthma, or type 2 diabetes mellitus.  For You  Breastfeeding helps to create a very special bond between you and your baby.  Breastfeeding is convenient. Breast milk is always available at the correct temperature and costs nothing.  Breastfeeding helps to burn calories and helps you lose the weight gained during pregnancy.  Breastfeeding makes your uterus contract to its prepregnancy size faster and slows bleeding (lochia) after you give birth.  Breastfeeding helps to lower your risk of developing type 2 diabetes mellitus, osteoporosis, and breast or ovarian cancer later in life.  Signs that your baby is hungry Early Signs of Hunger  Increased alertness or activity.  Stretching.  Movement of the head from side to side.  Movement of the head and opening of the mouth when the corner of the mouth or cheek is stroked (rooting).  Increased sucking sounds, smacking lips, cooing, sighing, or squeaking.  Hand-to-mouth movements.  Increased sucking of fingers or hands.  Late Signs of Hunger  Fussing.  Intermittent crying.  Extreme Signs of Hunger Signs of extreme hunger will require calming and consoling before your baby will be able to breastfeed successfully. Do not wait for the following signs of extreme hunger to occur before you initiate breastfeeding:  Restlessness.  A loud, strong cry.  Screaming.  Breastfeeding basics Breastfeeding Initiation  Find a comfortable place to sit or lie down,  with your neck and back well supported.  Place a pillow or rolled up blanket under your baby to bring him or her to the level of your breast (if you are seated). Nursing pillows are specially designed to help support your arms and your baby while you breastfeed.  Make sure that your baby's abdomen is facing your abdomen.  Gently massage your breast. With your fingertips, massage from your chest wall toward your nipple in a circular motion. This encourages milk flow. You may need to continue this action during the feeding if your milk flows slowly.  Support your breast with 4 fingers underneath and your thumb above your nipple. Make sure your fingers are well away from your nipple and your babys mouth.  Stroke your baby's lips gently with your finger or nipple.  When your baby's mouth is open wide enough, quickly bring your baby to your breast, placing your entire nipple and as much of the colored  area around your nipple (areola) as possible into your baby's mouth. ? More areola should be visible above your baby's upper lip than below the lower lip. ? Your baby's tongue should be between his or her lower gum and your breast.  Ensure that your baby's mouth is correctly positioned around your nipple (latched). Your baby's lips should create a seal on your breast and be turned out (everted).  It is common for your baby to suck about 2-3 minutes in order to start the flow of breast milk.  Latching Teaching your baby how to latch on to your breast properly is very important. An improper latch can cause nipple pain and decreased milk supply for you and poor weight gain in your baby. Also, if your baby is not latched onto your nipple properly, he or she may swallow some air during feeding. This can make your baby fussy. Burping your baby when you switch breasts during the feeding can help to get rid of the air. However, teaching your baby to latch on properly is still the best way to prevent fussiness  from swallowing air while breastfeeding. Signs that your baby has successfully latched on to your nipple:  Silent tugging or silent sucking, without causing you pain.  Swallowing heard between every 3-4 sucks.  Muscle movement above and in front of his or her ears while sucking.  Signs that your baby has not successfully latched on to nipple:  Sucking sounds or smacking sounds from your baby while breastfeeding.  Nipple pain.  If you think your baby has not latched on correctly, slip your finger into the corner of your babys mouth to break the suction and place it between your baby's gums. Attempt breastfeeding initiation again. Signs of Successful Breastfeeding Signs from your baby:  A gradual decrease in the number of sucks or complete cessation of sucking.  Falling asleep.  Relaxation of his or her body.  Retention of a small amount of milk in his or her mouth.  Letting go of your breast by himself or herself.  Signs from you:  Breasts that have increased in firmness, weight, and size 1-3 hours after feeding.  Breasts that are softer immediately after breastfeeding.  Increased milk volume, as well as a change in milk consistency and color by the fifth day of breastfeeding.  Nipples that are not sore, cracked, or bleeding.  Signs That Your Pecola Leisure is Getting Enough Milk  Wetting at least 1-2 diapers during the first 24 hours after birth.  Wetting at least 5-6 diapers every 24 hours for the first week after birth. The urine should be clear or pale yellow by 5 days after birth.  Wetting 6-8 diapers every 24 hours as your baby continues to grow and develop.  At least 3 stools in a 24-hour period by age 63 days. The stool should be soft and yellow.  At least 3 stools in a 24-hour period by age 38 days. The stool should be seedy and yellow.  No loss of weight greater than 10% of birth weight during the first 16 days of age.  Average weight gain of 4-7 ounces (113-198 g)  per week after age 356 days.  Consistent daily weight gain by age 63 days, without weight loss after the age of 2 weeks.  After a feeding, your baby may spit up a small amount. This is common. Breastfeeding frequency and duration Frequent feeding will help you make more milk and can prevent sore nipples and breast engorgement. Breastfeed  when you feel the need to reduce the fullness of your breasts or when your baby shows signs of hunger. This is called "breastfeeding on demand." Avoid introducing a pacifier to your baby while you are working to establish breastfeeding (the first 4-6 weeks after your baby is born). After this time you may choose to use a pacifier. Research has shown that pacifier use during the first year of a baby's life decreases the risk of sudden infant death syndrome (SIDS). Allow your baby to feed on each breast as long as he or she wants. Breastfeed until your baby is finished feeding. When your baby unlatches or falls asleep while feeding from the first breast, offer the second breast. Because newborns are often sleepy in the first few weeks of life, you may need to awaken your baby to get him or her to feed. Breastfeeding times will vary from baby to baby. However, the following rules can serve as a guide to help you ensure that your baby is properly fed:  Newborns (babies 26 weeks of age or younger) may breastfeed every 1-3 hours.  Newborns should not go longer than 3 hours during the day or 5 hours during the night without breastfeeding.  You should breastfeed your baby a minimum of 8 times in a 24-hour period until you begin to introduce solid foods to your baby at around 22 months of age.  Breast milk pumping Pumping and storing breast milk allows you to ensure that your baby is exclusively fed your breast milk, even at times when you are unable to breastfeed. This is especially important if you are going back to work while you are still breastfeeding or when you are not  able to be present during feedings. Your lactation consultant can give you guidelines on how long it is safe to store breast milk. A breast pump is a machine that allows you to pump milk from your breast into a sterile bottle. The pumped breast milk can then be stored in a refrigerator or freezer. Some breast pumps are operated by hand, while others use electricity. Ask your lactation consultant which type will work best for you. Breast pumps can be purchased, but some hospitals and breastfeeding support groups lease breast pumps on a monthly basis. A lactation consultant can teach you how to hand express breast milk, if you prefer not to use a pump. Caring for your breasts while you breastfeed Nipples can become dry, cracked, and sore while breastfeeding. The following recommendations can help keep your breasts moisturized and healthy:  Avoid using soap on your nipples.  Wear a supportive bra. Although not required, special nursing bras and tank tops are designed to allow access to your breasts for breastfeeding without taking off your entire bra or top. Avoid wearing underwire-style bras or extremely tight bras.  Air dry your nipples for 3-49minutes after each feeding.  Use only cotton bra pads to absorb leaked breast milk. Leaking of breast milk between feedings is normal.  Use lanolin on your nipples after breastfeeding. Lanolin helps to maintain your skin's normal moisture barrier. If you use pure lanolin, you do not need to wash it off before feeding your baby again. Pure lanolin is not toxic to your baby. You may also hand express a few drops of breast milk and gently massage that milk into your nipples and allow the milk to air dry.  In the first few weeks after giving birth, some women experience extremely full breasts (engorgement). Engorgement can make your  breasts feel heavy, warm, and tender to the touch. Engorgement peaks within 3-5 days after you give birth. The following  recommendations can help ease engorgement:  Completely empty your breasts while breastfeeding or pumping. You may want to start by applying warm, moist heat (in the shower or with warm water-soaked hand towels) just before feeding or pumping. This increases circulation and helps the milk flow. If your baby does not completely empty your breasts while breastfeeding, pump any extra milk after he or she is finished.  Wear a snug bra (nursing or regular) or tank top for 1-2 days to signal your body to slightly decrease milk production.  Apply ice packs to your breasts, unless this is too uncomfortable for you.  Make sure that your baby is latched on and positioned properly while breastfeeding.  If engorgement persists after 48 hours of following these recommendations, contact your health care provider or a Advertising copywriter. Overall health care recommendations while breastfeeding  Eat healthy foods. Alternate between meals and snacks, eating 3 of each per day. Because what you eat affects your breast milk, some of the foods may make your baby more irritable than usual. Avoid eating these foods if you are sure that they are negatively affecting your baby.  Drink milk, fruit juice, and water to satisfy your thirst (about 10 glasses a day).  Rest often, relax, and continue to take your prenatal vitamins to prevent fatigue, stress, and anemia.  Continue breast self-awareness checks.  Avoid chewing and smoking tobacco. Chemicals from cigarettes that pass into breast milk and exposure to secondhand smoke may harm your baby.  Avoid alcohol and drug use, including marijuana. Some medicines that may be harmful to your baby can pass through breast milk. It is important to ask your health care provider before taking any medicine, including all over-the-counter and prescription medicine as well as vitamin and herbal supplements. It is possible to become pregnant while breastfeeding. If birth control is  desired, ask your health care provider about options that will be safe for your baby. Contact a health care provider if:  You feel like you want to stop breastfeeding or have become frustrated with breastfeeding.  You have painful breasts or nipples.  Your nipples are cracked or bleeding.  Your breasts are red, tender, or warm.  You have a swollen area on either breast.  You have a fever or chills.  You have nausea or vomiting.  You have drainage other than breast milk from your nipples.  Your breasts do not become full before feedings by the fifth day after you give birth.  You feel sad and depressed.  Your baby is too sleepy to eat well.  Your baby is having trouble sleeping.  Your baby is wetting less than 3 diapers in a 24-hour period.  Your baby has less than 3 stools in a 24-hour period.  Your baby's skin or the white part of his or her eyes becomes yellow.  Your baby is not gaining weight by 75 days of age. Get help right away if:  Your baby is overly tired (lethargic) and does not want to wake up and feed.  Your baby develops an unexplained fever. This information is not intended to replace advice given to you by your health care provider. Make sure you discuss any questions you have with your health care provider. Document Released: 08/02/2005 Document Revised: 01/14/2016 Document Reviewed: 01/24/2013 Elsevier Interactive Patient Education  2017 [employer].

## 2017-04-08 NOTE — Discharge Summary (Signed)
Obstetric Discharge Summary  Patient ID: Bonnie Armstrong MRN: [id number] DOB/AGE: 1981-04-22 36 y.o.   Date of Admission: 04/06/2017 Bonnie Armstrong, CNM Bonnie [name], MD)  Date of Discharge: Bonnie Armstrong, CNM Bonnie [name], MD)  Admitting Diagnosis: Induction of labor at [redacted]w[redacted]d  Secondary Diagnosis: Gestational diabetes medication controlled (A2) and AMA and Maternal obesity  Mode of Delivery: normal spontaneous vaginal delivery     Discharge Diagnosis: status post vaginal birth, successful vaginal birth after c-section   Intrapartum Procedures: Atificial rupture of membranes   Post partum procedures: None  Complications: none   Brief Hospital Course   Bonnie Armstrong is a [id number] who had a SVD on 04/06/2017;  for further details of this birth, please refer to the delivery note.  Patient had an uncomplicated postpartum course.  By time of discharge on PPD#2, her pain was controlled on oral pain medications; she had appropriate lochia and was ambulating, voiding without difficulty and tolerating regular diet.  She was deemed stable for discharge to home.    Labs: CBC Latest Ref Rng & Units 04/07/2017 04/06/2017 01/13/2017  WBC 3.6 - 11.0 K/uL 9.4 10.6 -  Hemoglobin 12.0 - 16.0 g/dL 11.2(L) 11.9(L) 11.3  Hematocrit 35.0 - 47.0 % 33.0(L) 35.4 34.5  Platelets 150 - 440 K/uL 202 218 -   A POS  Physical exam:   Temp:  [97.5 F (36.4 C)-98.7 F (37.1 C)] 98.7 F (37.1 C) (08/23 2304) Pulse Rate:  [80-84] 84 (08/23 1933) Resp:  [18-20] 20 (08/23 1933) BP: (121-130)/(72-80) 121/74 (08/23 1933) SpO2:  [98 %-99 %] 99 % (08/23 1933)  General: Alert and oriented x 4, no apparent distress.   Heart: Regular rate and rhythm.   Lungs: Clear bilaterally.   Breast: Deferred, no complaints.   Lochia: Appropriate  Abdomen: Soft, non-tender, obese  Uterine Fundus: firm  Perineum: Intact, generalized swelling present.   Extremities: No evidence of DVT seen on physical exam.  No lower extremity edema.  Discharge Instructions: Per After Visit Summary.  Activity: Advance as tolerated. Pelvic rest for 6 weeks.  Also refer to After Visit Summary  Diet: Regular  Medications: Allergies as of 04/08/2017   No Known Allergies     Medication List    STOP taking these medications   glyBURIDE 2.5 MG tablet Commonly known as:  DIABETA     TAKE these medications   prenatal multivitamin Tabs tablet Take 1 tablet by mouth daily at 12 noon.      Outpatient follow up:  Follow-up Information    Gunnar Bulla, CNM. Call in 6 week(s).   Specialties:  Certified Nurse Midwife, Obstetrics and Gynecology, Radiology Why:  Please call to schedule a PPV in six (6) weeks with Bonnie Armstrong, CNM Contact information: [address] [place] [address] [phone]          Postpartum contraception: natural family planning (NFP)  Discharged Condition: stable  Discharged to: home   Newborn Data:  Disposition:home with mother  Apgars: APGAR (1 MIN): 8   APGAR (5 MINS): 9   APGAR (10 MINS):    Baby Feeding: Breast    Gunnar Bulla, CNM

## 2017-04-08 NOTE — Discharge Summary (Signed)
Obstetric Discharge Summary  Patient ID: Bonnie Armstrong MRN: [id number] DOB/AGE: November 20, 1980 36 y.o.   Date of Admission: 04/04/2017 Bonnie Armstrong, CNM Bonnie [name], MD)  Date of Discharge: 04/04/2017 Bonnie Armstrong, CNM Bonnie [name], MD)  Admitting Diagnosis: NST at [redacted]w[redacted]d  Secondary Diagnosis: Gestational diabetes medication controlled (A2) and Obesity and AMA    Discharge Diagnosis: No other diagnosis   Antepartum Procedures: NST    Brief Hospital Course   L&D OB Triage Note  Bonnie Armstrong is a 36 y.o. [id number] female at [redacted]w[redacted]d, EDD Estimated Date of Delivery: 04/06/17 who presented to triage for NST due to gestational diabetes-glyburide, maternal obesity, and AMA.    Objective:  BP 132/74   Pulse 89   Temp 98.2 F (36.8 C) (Oral)   Resp 20   LMP 06/30/2016 (Approximate)   Mode: External Baseline Rate (A): 135 bpm Variability: Moderate Accelerations: 15 x 15 Decelerations: None   Contraction Frequency (min): none  Assessment:  Bonnie Armstrong, 36 y.o. [phone], previous c-section, VBAC x 4, desires TOLAC, Rh positive, gestational diabetes-glyburide, maternal obesity, AMA  FHR Category I-Reactive NST   Plan:  NST performed was reviewed and was found to be reactive. She was discharged home with bleeding/labor precautions. Patient should return to hospital for induction of labor on 04/06/2017 at 0500 or sooner if needed.   Discharge Instructions: Per After Visit Summary.  Activity: Advance as tolerated. Also refer to After Visit Summary  Diet: Gestational diabetic  Medications: Allergies as of 04/04/2017   No Known Allergies     Medication List    ASK your doctor about these medications   glyBURIDE 2.5 MG tablet Commonly known as:  DIABETA Take 1 tablet (2.5 mg total) by mouth at bedtime.   prenatal multivitamin Tabs tablet Take 1 tablet by mouth daily at 12 noon.      Outpatient follow up:  Follow-up Information    [facility] Follow up on 04/06/2017.   Why:  Induction of labor at 5:30 AM 04/06/17, enter through emergency department Contact information: [id number] [place] [address] [phone]         Postpartum contraception: Natural Family Planning  Discharged Condition: stable  Discharged to: home   Bonnie Armstrong, [place]

## 2017-04-08 NOTE — Progress Notes (Signed)
All discharge instructions reviewed with pt at this time; pt will have motrin at 2300 then be "discharged from [facility]"; pt understands all discharge teaching; pt signed papers; baby staying as peds baby for hyperbilirubinemia

## 2017-04-08 NOTE — Progress Notes (Signed)
Post Partum Day 1  Subjective:  Pt sitting in bed holding infant. Family members at bedside. Eating, voiding, and ambulating without difficulty.   Denies difficulty breathing or respiratory distress, chest pain, abdominal pain, excessive vaginal bleeding, and leg pain or swelling.   Objective:  Vital signs stable  Physical Exam:   General: alert and cooperative   Heart: RRR  Lungs: CTAB  Breast: Deferred, no complaints  Lochia: See nursing assessment  Uterine Fundus: See nursing assessment  Perineum: See nursing assessment  DVT Evaluation: No evidence of DVT seen on physical exam. Negative Homan's sign.   Recent Labs  04/06/17 0650 04/07/17 0507  HGB 11.9* 11.2*  HCT 35.4 33.0*    Assessment:  Status post vaginal birth, successful VBAC  Breastfeeding  Plan:  Discontinue glyburide and blood sugar checks  Plan for discharge tomorrow   Reviewed red flag symptoms and when to call.   Continue orders as written.   Reassess as needed.    LOS: 2 days     Gunnar Bulla, CNM 04/08/2017, 4:14 AM

## 2017-04-12 ENCOUNTER — Encounter: Payer: Self-pay | Admitting: Certified Nurse Midwife

## 2017-04-12 ENCOUNTER — Telehealth: Payer: Self-pay | Admitting: Certified Nurse Midwife

## 2017-04-12 NOTE — Telephone Encounter (Signed)
Pt stated that the last couple days she has been running a fever off and on with it being the highest at 101.6. Pt stated that she has also has cold and flu symptoms. Pt stated that she has been treating it with Advil but it took most of the day for her fever to break today. Pt wanted to see if this was normal or what she should do. Please advise. Thanks TNP

## 2017-04-12 NOTE — Telephone Encounter (Signed)
Fever off/on. Pt unsure if she is getting mastitis. After pumping feels better. Breast enlarged (of course she just delivered) IBP taking routinely.Has clogged duct, working on that.  Pt scheduled for appt in am.

## 2017-04-13 ENCOUNTER — Ambulatory Visit (INDEPENDENT_AMBULATORY_CARE_PROVIDER_SITE_OTHER): Payer: BLUE CROSS/BLUE SHIELD | Admitting: Certified Nurse Midwife

## 2017-04-13 ENCOUNTER — Encounter: Payer: Self-pay | Admitting: Certified Nurse Midwife

## 2017-04-13 VITALS — BP 128/80 | HR 80 | Wt 239.1 lb

## 2017-04-13 DIAGNOSIS — R82998 Other abnormal findings in urine: Secondary | ICD-10-CM

## 2017-04-13 DIAGNOSIS — R5081 Fever presenting with conditions classified elsewhere: Secondary | ICD-10-CM | POA: Diagnosis not present

## 2017-04-13 DIAGNOSIS — R8299 Other abnormal findings in urine: Secondary | ICD-10-CM

## 2017-04-13 LAB — POCT URINALYSIS DIPSTICK
Bilirubin, UA: NEGATIVE
Glucose, UA: NEGATIVE
Ketones, UA: NEGATIVE
Nitrite, UA: POSITIVE
Spec Grav, UA: 1.015 (ref 1.010–1.025)
Urobilinogen, UA: 0.2 E.U./dL
pH, UA: 6 (ref 5.0–8.0)

## 2017-04-13 MED ORDER — CIPROFLOXACIN HCL 500 MG PO TABS: 500.0000 mg | ORAL_TABLET | Freq: Two times a day (BID) | ORAL | 0 refills | Status: AC

## 2017-04-13 NOTE — Patient Instructions (Signed)
Breastfeeding and Mastitis  Mastitis is inflammation of the breast tissue. It can occur in women who are breastfeeding. This can make breastfeeding painful. Mastitis will sometimes go away on its own, especially if it is not caused by an infection (non-infectious mastitis). Your health care provider will help determine if medical treatment is needed. Treatment may be needed if the condition is caused by a bacterial infection (infectious mastitis).  What are the causes?  This condition is often associated with a blocked milkduct, which can happen when too much milk builds up in the breast. Causes of excess milk in the breast can include:  · Poor latch-on. If your baby is not latched onto the breast properly, he or she may not empty your breast completely while breastfeeding.  · Allowing too much time to pass between feedings.  · Wearing a bra or other clothing that is too tight. This puts extra pressure on the milk ducts so milk does not flow through them as it should.  · Milk remaining in the breast because it is overfilled (engorged).  · Stress and fatigue.    Mastitis can also be caused by a bacterial infection. Bacteria may enter the breast tissue through cuts, cracks, or openings in the skin near the nipple area. Cracks in the skin are often caused when your baby does not latch on properly to the breast.  What are the signs or symptoms?  Symptoms of this condition include:  · Swelling, redness, tenderness, and pain in an area of the breast. This usually affects the upper part of the breast, toward the armpit region. In most cases, it affects only one breast. In some cases, it may occur on both breasts at the same time and affect a larger portion of breast tissue.  · Swelling of the glands under the arm on the same side.  · Fatigue, headache, and flu-like muscle aches.  · Fever.  · Rapid pulse.    Symptoms usually last 2 to 5 days. Breast pain and redness are at their worst on day 2 and day 3, and they usually go  away by day 5. If an infection is left to progress, a collection of pus (abscess) may develop.  How is this diagnosed?  This condition can be diagnosed based on your symptoms and a physical exam. You may also have tests, such as:  · Blood tests to determine if your body is fighting a bacterial infection.  · Mammogram or ultrasound tests to rule out other problems or diseases.  · Fluid tests. If an abscess has developed, the fluid in the abscess may be removed with a needle. The fluid may be analyzed to determine if bacteria are present.  · Breast milk may be cultured and tested for bacteria.    How is this treated?  This condition will sometimes go away on its own. Your health care provider may choose to wait 24 hours after first seeing you to decide whether treatment is needed. If treatment is needed, it may include:  · Strategies to manage breastfeeding. This includes continuing to breastfeed or pump in order to allow adequate milk flow, using breast massage, and applying heat or cold to the affected area.  · Self-care such as rest and increased fluid intake.  · Medicine for pain.  · Antibiotic medicine to treat a bacterial infection. This is usually taken by mouth.  · If an abscess has developed, it may be treated by removing fluid with a needle.      Follow these instructions at home:  Medicines  · Take over-the-counter and prescription medicines only as told by your health care provider.  · If you were prescribed an antibiotic medicine, take it as told by your health care provider. Do not stop taking the antibiotic even if you start to feel better.  General instructions  · Do not wear a tight or underwire bra. Wear a soft, supportive bra.  · Increase your fluid intake, especially if you have a fever.  · Get plenty of rest.  For breastfeeding:  · Continue to empty your breasts as often as possible, either by breastfeeding or using an electric breast pump. This will lower the pressure and the pain that comes with  it. Ask your health care provider if changes need to be made to your breastfeeding or pumping routine.  · Keep your nipples clean and dry.  · During breastfeeding, empty the first breast completely before going to the other breast. If your baby is not emptying your breasts completely, use a breast pump to empty your breasts.  · Use breast massage during feeding or pumping sessions.  · If directed, apply moist heat to the affected area of your breast right before breastfeeding or pumping. Use the heat source that your health care provider recommends.  · If directed, put ice on the affected area of your breast right after breastfeeding or pumping:  ? Put ice in a plastic bag.  ? Place a towel between your skin and the bag.  ? Leave the ice on for 20 minutes.  · If you go back to work, pump your breasts while at work to stay in time with your nursing schedule.  · Do not allow your breasts to become engorged.  Contact a health care provider if:  · You have pus-like discharge from the breast.  · You have a fever.  · Your symptoms do not improve within 2 days of starting treatment.  · Your symptoms return after you have recovered from a breast infection.  Get help right away if:  · Your pain and swelling are getting worse.  · You have pain that is not controlled with medicine.  · You have a red line extending from the breast toward your armpit.  Summary  · Mastitis is inflammation of the breast tissue. It is often caused by a blocked milk duct or bacteria.  · This condition may be treated with hot and cold compresses, medicines, self-care, and certain breastfeeding strategies.  · If you were prescribed an antibiotic medicine, take it as told by your health care provider. Do not stop taking the antibiotic even if you start to feel better.  · Continue to empty your breasts as often as possible either by breastfeeding or using an electric breast pump.  This information is not intended to replace advice given to you by your  health care provider. Make sure you discuss any questions you have with your health care provider.  Document Released: 11/27/2004 Document Revised: 08/03/2016 Document Reviewed: 08/03/2016  Elsevier Interactive Patient Education © 2017 [employer]

## 2017-04-13 NOTE — Progress Notes (Signed)
GYN ENCOUNTER NOTE  Subjective:       Bonnie Armstrong is a 36 y.o. [id number] female , postpartum day 7. She had NSVD on 04/06/17. She  is here for gynecologic evaluation of the following issues:  1. Fever on and off over the past few days. She denies tenderness in her breast or redness or masses. She denies abdominal tenderness , or mal odor in vaginal discharge. She has been taking Ibuprofen for postpartum pain.    Gynecologic History No LMP recorded. Contraception: abstinence Last Pap: 04/15/2016 Results were: normal Last mammogram: n/a.   Obstetric History OB History  Gravida Para Term Preterm AB Living  7 6 5 1 1 6   SAB TAB Ectopic Multiple Live Births  1     0 6    # Outcome Date GA Lbr Len/2nd Weight Sex Delivery Anes PTL Lv  7 Term 04/06/17 [redacted]w[redacted]d / 00:01 8 lb 1.1 oz (3.66 kg) F Vag-Spont None  LIV  6 Term 12/15/15 [redacted]w[redacted]d  7 lb 6 oz (3.345 kg) M Vag-Spont   LIV  5 Term 12/19/07 [redacted]w[redacted]d  8 lb 6 oz (3.799 kg) M Vag-Spont   LIV  4 SAB 2007          3 Term 02/20/05 [redacted]w[redacted]d  7 lb 15 oz (3.6 kg) M Vag-Spont   LIV  2 Preterm 04/05/02 [redacted]w[redacted]d  3 lb (1.361 kg) M Vag-Spont   LIV  1 Term 05/23/01 [redacted]w[redacted]d  6 lb 15 oz (3.147 kg) F CS-LTranv   LIV     Birth Comments: Baby stuck in birth cannal, had STAT c/s       Past Medical History:  Diagnosis Date  . Gestational diabetes   . IFG (impaired fasting glucose)   . Morbid obesity (HCC) 02/28/2015  . Prediabetes     Past Surgical History:  Procedure Laterality Date  . CESAREAN SECTION  2002  . WISDOM TOOTH EXTRACTION      Current Outpatient Prescriptions on File Prior to Visit  Medication Sig Dispense Refill  . Prenatal Vit-Fe Fumarate-FA (PRENATAL MULTIVITAMIN) TABS tablet Take 1 tablet by mouth daily at 12 noon.     No current facility-administered medications on file prior to visit.     No Known Allergies  Social History   Social History  . Marital status: Married    Spouse name: N/A  . Number of children: N/A  . Years of  education: N/A   Occupational History  . Not on file.   Social History Main Topics  . Smoking status: Never Smoker  . Smokeless tobacco: Never Used  . Alcohol use No  . Drug use: No  . Sexual activity: Yes    Birth control/ protection: None   Other Topics Concern  . Not on file   Social History Narrative  . No narrative on file    Family History  Problem Relation Age of Onset  . Hypertension Mother   . Hyperlipidemia Mother   . Diabetes Maternal Grandfather   . COPD Paternal Grandmother   . Cancer Paternal Grandfather        colon    The following portions of the patient's history were reviewed and updated as appropriate: allergies, current medications, past family history, past medical history, past social history, past surgical history and problem list.  Review of Systems Review of Systems - Negative except as mentioned in HPI Review of Systems - General ROS: negative for - chills, hot flashes, malaise or night sweats. Positive for  fever and fatigue  Hematological and Lymphatic ROS: negative for - bleeding problems or swollen lymph nodes Gastrointestinal ROS: negative for - abdominal pain, blood in stools, change in bowel habits and nausea/vomiting Musculoskeletal ROS: negative for - joint pain, muscle pain or muscular weakness Genito-Urinary ROS: negative for -  dysuria, genital discharge, genital ulcers, hematuria, incontinence, irregular/heavy menses, nocturia or pelvic pain  Objective:   BP 128/80   Pulse 80   Wt 239 lb 1.6 oz (108.5 kg)   BMI 41.04 kg/m  CONSTITUTIONAL: Well-developed, well-nourished female in no acute distress.  HENT:  Normocephalic, atraumatic.  NECK: Normal range of motion, supple, no masses.  Normal thyroid.  SKIN: Skin is warm and dry. No rash noted. Not diaphoretic. No erythema. No pallor. Negative for CVA tenderness NEUROLGIC: Alert and oriented to person, place, and time. PSYCHIATRIC: Normal mood and affect. Normal behavior. Normal  judgment and thought content. CARDIOVASCULAR: Regular Rate & rhythm  RESPIRATORY: Clear Bilaterally  BREASTS: No signs of infection, no redness or red area, no firm lumps or signs of block ducts.  ABDOMEN: Soft, non distended; Non tender.  No Organomegaly. FF@ u-3 PELVIC:  External Genitalia: Normal, no inflammation , no odor  BUS: Normal, no swelling  Vagina: Normal from external exam. Speculum not inserted.     Bladder: Nontender MUSCULOSKELETAL: Normal range of motion. No tenderness.  No cyanosis, clubbing, or edema.   Assessment:   1. Leukocytes in urine - Urine Culture  2. Fever in other diseases - Urine Culture  3. Fever presenting with conditions classified elsewhere - POCT urinalysis dipstick     Plan:   Urine dip positive for nitrites , urine sent for culture. Pt started on ciprofloxacin due to nursing. Will follow up with results of culture. Red flag symptoms reviewed.   Doreene BurkeAnnie Guadalupe Nickless, CNM

## 2017-04-15 ENCOUNTER — Encounter: Payer: Self-pay | Admitting: Certified Nurse Midwife

## 2017-04-15 LAB — URINE CULTURE

## 2017-04-19 ENCOUNTER — Encounter: Payer: BLUE CROSS/BLUE SHIELD | Admitting: Family Medicine

## 2017-06-10 ENCOUNTER — Encounter: Payer: Self-pay | Admitting: Certified Nurse Midwife

## 2017-06-13 ENCOUNTER — Other Ambulatory Visit: Payer: Self-pay | Admitting: Certified Nurse Midwife

## 2017-06-13 MED ORDER — DICLOXACILLIN SODIUM 500 MG PO CAPS: 500.0000 mg | ORAL_CAPSULE | Freq: Four times a day (QID) | ORAL | 0 refills | Status: AC

## 2017-08-03 ENCOUNTER — Telehealth: Payer: Self-pay | Admitting: Certified Nurse Midwife

## 2017-08-03 MED ORDER — DICLOXACILLIN SODIUM 500 MG PO CAPS: 500.0000 mg | ORAL_CAPSULE | Freq: Four times a day (QID) | ORAL | 0 refills | Status: DC

## 2017-08-03 NOTE — Telephone Encounter (Signed)
The patient called and stated that she needed to speak with Debbie, No other information was disclosed. Please advise.

## 2017-08-03 NOTE — Telephone Encounter (Signed)
Please send in a prescription for dicloxacillin 500 mg po 4 times a day for 14 days. Then contact her to let her know .   Thanks Doreene BurkeAnnie Delaney Schnick, CNM

## 2017-08-03 NOTE — Telephone Encounter (Signed)
Annie sent medication in

## 2017-08-03 NOTE — Telephone Encounter (Signed)
Patient called stating she has mastitis. She states she knows that's what it is since this is the 8th baby she has breast fed. She has no coverage at work and wanted to know if something can be called in for her. [facility] [place]. Thanks

## 2017-08-03 NOTE — Telephone Encounter (Signed)
Pt states she has mastitis. Per pt this is her 8th breast fed baby and she is certain. Sx - fever 100-101, redness, hardness in the breast, tender, flu like sx. Pt is pumping. Using hot/cold compress. 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 Pt aware I will send message to AT. If no response by 4pm today, to call me back. Please advise.

## 2017-08-03 NOTE — Telephone Encounter (Signed)
Med erx. Pt aware. Pt encourage to nurse from that breast, cold compress, tylenol 1000g q6 h and push fluids.  If no better in 48 hours to contact office to be seen. Pt voices understanding.

## 2017-11-21 ENCOUNTER — Encounter: Payer: Self-pay | Admitting: Certified Nurse Midwife

## 2017-11-22 ENCOUNTER — Other Ambulatory Visit: Payer: Self-pay | Admitting: Certified Nurse Midwife

## 2017-11-22 MED ORDER — DICLOXACILLIN SODIUM 500 MG PO CAPS: 500.0000 mg | ORAL_CAPSULE | Freq: Four times a day (QID) | ORAL | 0 refills | Status: AC

## 2018-01-25 ENCOUNTER — Encounter: Payer: Self-pay | Admitting: Family Medicine

## 2018-02-14 ENCOUNTER — Encounter: Payer: Self-pay | Admitting: Certified Nurse Midwife

## 2018-02-15 NOTE — Telephone Encounter (Signed)
The patient called and stated she's afraid that [name] is out of the office and she will not be able to get her medication before the holiday. The patient would like a call back. Please advise.

## 2018-02-20 ENCOUNTER — Other Ambulatory Visit: Payer: Self-pay

## 2018-02-20 MED ORDER — DICLOXACILLIN SODIUM 500 MG PO CAPS: 500.0000 mg | ORAL_CAPSULE | Freq: Four times a day (QID) | ORAL | 0 refills | Status: DC

## 2018-02-20 NOTE — Telephone Encounter (Signed)
Contact patient to check status. May have Rx for dicloxacillin. MUST COMPLETE AS PRESCRIBED. Thanks, JML

## 2018-03-09 ENCOUNTER — Encounter: Payer: BLUE CROSS/BLUE SHIELD | Admitting: Family Medicine

## 2018-03-13 ENCOUNTER — Ambulatory Visit: Payer: BLUE CROSS/BLUE SHIELD | Admitting: Family Medicine

## 2018-03-13 ENCOUNTER — Encounter: Payer: Self-pay | Admitting: Family Medicine

## 2018-03-13 VITALS — BP 122/80 | Temp 98.9°F | Resp 12 | Ht 64.0 in | Wt 256.8 lb

## 2018-03-13 DIAGNOSIS — Z8632 Personal history of gestational diabetes: Secondary | ICD-10-CM | POA: Diagnosis not present

## 2018-03-13 DIAGNOSIS — E1165 Type 2 diabetes mellitus with hyperglycemia: Secondary | ICD-10-CM | POA: Diagnosis not present

## 2018-03-13 DIAGNOSIS — Z0000 Encounter for general adult medical examination without abnormal findings: Secondary | ICD-10-CM

## 2018-03-13 DIAGNOSIS — Z0001 Encounter for general adult medical examination with abnormal findings: Secondary | ICD-10-CM | POA: Diagnosis not present

## 2018-03-13 LAB — COMPLETE METABOLIC PANEL WITH GFR
AG Ratio: 1.8 (calc) (ref 1.0–2.5)
ALT: 17 U/L (ref 6–29)
AST: 13 U/L (ref 10–30)
Albumin: 4.5 g/dL (ref 3.6–5.1)
Alkaline phosphatase (APISO): 76 U/L (ref 33–115)
BUN: 10 mg/dL (ref 7–25)
CO2: 25 mmol/L (ref 20–32)
Calcium: 9.4 mg/dL (ref 8.6–10.2)
Chloride: 104 mmol/L (ref 98–110)
Creat: 0.61 mg/dL (ref 0.50–1.10)
GFR, Est African American: 132 mL/min/{1.73_m2} (ref >=60)
GFR, Est Non African American: 114 mL/min/{1.73_m2} (ref >=60)
Globulin: 2.5 g/dL (calc) (ref 1.9–3.7)
Glucose, Bld: 115 mg/dL — ABNORMAL HIGH (ref 65–99)
Potassium: 3.9 mmol/L (ref 3.5–5.3)
Sodium: 138 mmol/L (ref 135–146)
Total Bilirubin: 0.4 mg/dL (ref 0.2–1.2)
Total Protein: 7 g/dL (ref 6.1–8.1)

## 2018-03-13 LAB — CBC WITH DIFFERENTIAL/PLATELET
Basophils Absolute: 33 cells/uL (ref 0–200)
Basophils Relative: 0.4 %
Eosinophils Absolute: 131 cells/uL (ref 15–500)
Eosinophils Relative: 1.6 %
HCT: 41.7 % (ref 35.0–45.0)
Hemoglobin: 14 g/dL (ref 11.7–15.5)
Lymphs Abs: 2657 cells/uL (ref 850–3900)
MCH: 28.1 pg (ref 27.0–33.0)
MCHC: 33.6 g/dL (ref 32.0–36.0)
MCV: 83.7 fL (ref 80.0–100.0)
MPV: 10.1 fL (ref 7.5–12.5)
Monocytes Relative: 5.5 %
Neutro Abs: 4928 cells/uL (ref 1500–7800)
Neutrophils Relative %: 60.1 %
Platelets: 325 10*3/uL (ref 140–400)
RBC: 4.98 10*6/uL (ref 3.80–5.10)
RDW: 13 % (ref 11.0–15.0)
Total Lymphocyte: 32.4 %
WBC mixed population: 451 cells/uL (ref 200–950)
WBC: 8.2 10*3/uL (ref 3.8–10.8)

## 2018-03-13 LAB — TSH: TSH: 0.98 mIU/L

## 2018-03-13 LAB — POCT GLYCOSYLATED HEMOGLOBIN (HGB A1C): Hemoglobin A1C: 7.8 % — AB (ref 4.0–5.6)

## 2018-03-13 LAB — LIPID PANEL
Cholesterol: 208 mg/dL — ABNORMAL HIGH (ref <200)
HDL: 51 mg/dL (ref >50)
LDL Cholesterol (Calc): 136 mg/dL (calc) — ABNORMAL HIGH
Non-HDL Cholesterol (Calc): 157 mg/dL (calc) — ABNORMAL HIGH (ref <130)
Total CHOL/HDL Ratio: 4.1 (calc) (ref <5.0)
Triglycerides: 103 mg/dL (ref <150)

## 2018-03-13 NOTE — Assessment & Plan Note (Addendum)
Encouraged weight loss; she sounds motivated with new dx of type 2 DM

## 2018-03-13 NOTE — Assessment & Plan Note (Signed)
Discussed new diagnosis; refer to eye doctor for baseline eye exam, refer to diabetic education (she never did that with her gestational diabetes); return in 4 weeks; she does NOT want medicine and wants to work on diet and weight loss; discussed risk of MI with out of control DM, will not want her to go above 8 by any means, but willing to let her try diet and weight loss alone if A1c under 8 for now

## 2018-03-13 NOTE — Patient Instructions (Addendum)
Check out the information at familydoctor.org entitled "Nutrition for Weight Loss: What You Need to Know about Fad Diets" Try to lose between 1-2 pounds per week by taking in fewer calories and burning off more calories You can succeed by limiting portions, limiting foods dense in calories and fat, becoming more active, and drinking 8 glasses of water a day (64 ounces) Don't skip meals, especially breakfast, as skipping meals may alter your metabolism Do not use over-the-counter weight loss pills or gimmicks that claim rapid weight loss A healthy BMI (or body mass index) is between 18.5 and 24.9 You can calculate your ideal BMI at the [place] website [employer]fr   Obesity, Adult Obesity is the condition of having too much total body fat. Being overweight or obese means that your weight is greater than what is considered healthy for your body size. Obesity is determined by a measurement called BMI. BMI is an estimate of body fat and is calculated from height and weight. For adults, a BMI of 30 or higher is considered obese. Obesity can eventually lead to other health concerns and major illnesses, including:  Stroke.  Coronary artery disease (CAD).  Type 2 diabetes.  Some types of cancer, including cancers of the colon, breast, uterus, and gallbladder.  Osteoarthritis.  High blood pressure (hypertension).  High cholesterol.  Sleep apnea.  Gallbladder stones.  Infertility problems.  What are the causes? The main cause of obesity is taking in (consuming) more calories than your body uses for energy. Other factors that contribute to this condition may include:  Being born with genes that make you more likely to become obese.  Having a medical condition that causes obesity. These conditions include: ? Hypothyroidism. ? Polycystic ovarian syndrome (PCOS). ? Binge-eating disorder. ? Cushing syndrome.  Taking certain medicines,  such as steroids, antidepressants, and seizure medicines.  Not being physically active (sedentary lifestyle).  Living where there are limited places to exercise safely or buy healthy foods.  Not getting enough sleep.  What increases the risk? The following factors may increase your risk of this condition:  Having a family history of obesity.  Being a woman of African-American descent.  Being a man of Hispanic descent.  What are the signs or symptoms? Having excessive body fat is the main symptom of this condition. How is this diagnosed? This condition may be diagnosed based on:  Your symptoms.  Your medical history.  A physical exam. Your health care provider may measure: ? Your BMI. If you are an adult with a BMI between 25 and less than 30, you are considered overweight. If you are an adult with a BMI of 30 or higher, you are considered obese. ? The distances around your hips and your waist (circumferences). These may be compared to each other to help diagnose your condition. ? Your skinfold thickness. Your health care provider may gently pinch a fold of your skin and measure it.  How is this treated? Treatment for this condition often includes changing your lifestyle. Treatment may include some or all of the following:  Dietary changes. Work with your health care provider and a dietitian to set a weight-loss goal that is healthy and reasonable for you. Dietary changes may include eating: ? Smaller portions. A portion size is the amount of a particular food that is healthy for you to eat at one time. This varies from person to person. ? Low-calorie or low-fat options. ? More whole grains, fruits, and vegetables.  Regular physical activity. This may  include aerobic activity (cardio) and strength training.  Medicine to help you lose weight. Your health care provider may prescribe medicine if you are unable to lose 1 pound a week after 6 weeks of eating more healthily and  doing more physical activity.  Surgery. Surgical options may include gastric banding and gastric bypass. Surgery may be done if: ? Other treatments have not helped to improve your condition. ? You have a BMI of 40 or higher. ? You have life-threatening health problems related to obesity.  Follow these instructions at home:  Eating and drinking   Follow recommendations from your health care provider about what you eat and drink. Your health care provider may advise you to: ? Limit fast foods, sweets, and processed snack foods. ? Choose low-fat options, such as low-fat milk instead of whole milk. ? Eat 5 or more servings of fruits or vegetables every day. ? Eat at home more often. This gives you more control over what you eat. ? Choose healthy foods when you eat out. ? Learn what a healthy portion size is. ? Keep low-fat snacks on hand. ? Avoid sugary drinks, such as soda, fruit juice, iced tea sweetened with sugar, and flavored milk. ? Eat a healthy breakfast.  Drink enough water to keep your urine clear or pale yellow.  Do not go without eating for long periods of time (do not fast) or follow a fad diet. Fasting and fad diets can be unhealthy and even dangerous. Physical Activity  Exercise regularly, as told by your health care provider. Ask your health care provider what types of exercise are safe for you and how often you should exercise.  Warm up and stretch before being active.  Cool down and stretch after being active.  Rest between periods of activity. Lifestyle  Limit the time that you spend in front of your TV, computer, or video game system.  Find ways to reward yourself that do not involve food.  Limit alcohol intake to no more than 1 drink a day for nonpregnant women and 2 drinks a day for men. One drink equals 12 oz of beer, 5 oz of wine, or 1 oz of hard liquor. General instructions  Keep a weight loss journal to keep track of the food you eat and how much you  exercise you get.  Take over-the-counter and prescription medicines only as told by your health care provider.  Take vitamins and supplements only as told by your health care provider.  Consider joining a support group. Your health care provider may be able to recommend a support group.  Keep all follow-up visits as told by your health care provider. This is important. Contact a health care provider if:  You are unable to meet your weight loss goal after 6 weeks of dietary and lifestyle changes. This information is not intended to replace advice given to you by your health care provider. Make sure you discuss any questions you have with your health care provider. Document Released: 09/09/2004 Document Revised: 01/05/2016 Document Reviewed: 05/21/2015 Elsevier Interactive Patient Education  2018 [place].  Preventing Unhealthy [employer], Adult Staying at a healthy weight is important. When fat builds up in your body, you may become overweight or obese. These conditions put you at greater risk for developing certain health problems, such as heart disease, diabetes, sleeping problems, joint problems, and some cancers. Unhealthy weight gain is often the result of making unhealthy choices in what you eat. It is also a result of  not getting enough exercise. You can make changes to your lifestyle to prevent obesity and stay as healthy as possible. What nutrition changes can be made? To maintain a healthy weight and prevent obesity:  Eat only as much as your body needs. To do this: ? Pay attention to signs that you are hungry or full. Stop eating as soon as you feel full. ? If you feel hungry, try drinking water first. Drink enough water so your urine is clear or pale yellow. ? Eat smaller portions. ? Look at serving sizes on food labels. Most foods contain more than one serving per container. ? Eat the recommended amount of calories for your gender and activity level. While most active  people should eat around 2,000 calories per day, if you are trying to lose weight or are not very active, you main need to eat less calories. Talk to your health care provider or dietitian about how many calories you should eat each day.  Choose healthy foods, such as: ? Fruits and vegetables. Try to fill at least half of your plate at each meal with fruits and vegetables. ? Whole grains, such as whole wheat bread, brown rice, and quinoa. ? Lean meats, such as chicken or fish. ? Other healthy proteins, such as beans, eggs, or tofu. ? Healthy fats, such as nuts, seeds, fatty fish, and olive oil. ? Low-fat or fat-free dairy.  Check food labels and avoid food and drinks that: ? Are high in calories. ? Have added sugar. ? Are high in sodium. ? Have saturated fats or trans fats.  Limit how much you eat of the following foods: ? Prepackaged meals. ? Fast food. ? Fried foods. ? Processed meat, such as bacon, sausage, and deli meats. ? Fatty cuts of red meat and poultry with skin.  Cook foods in healthier ways, such as by baking, broiling, or grilling.  When grocery shopping, try to shop around the outside of the store. This helps you buy mostly fresh foods and avoid canned and prepackaged foods.  What lifestyle changes can be made?  Exercise at least 30 minutes 5 or more days each week. Exercising includes brisk walking, yard work, biking, running, swimming, and team sports like basketball and soccer. Ask your health care provider which exercises are safe for you.  Do not use any products that contain nicotine or tobacco, such as cigarettes and e-cigarettes. If you need help quitting, ask your health care provider.  Limit alcohol intake to no more than 1 drink a day for nonpregnant women and 2 drinks a day for men. One drink equals 12 oz of beer, 5 oz of wine, or 1 oz of hard liquor.  Try to get 7-9 hours of sleep each night. What other changes can be made?  Keep a food and activity  journal to keep track of: ? What you ate and how many calories you had. Remember to count sauces, dressings, and side dishes. ? Whether you were active, and what exercises you did. ? Your calorie, weight, and activity goals.  Check your weight regularly. Track any changes. If you notice you have gained weight, make changes to your diet or activity routine.  Avoid taking weight-loss medicines or supplements. Talk to your health care provider before starting any new medicine or supplement.  Talk to your health care provider before trying any new diet or exercise plan. Why are these changes important? Eating healthy, staying active, and having healthy habits not only help prevent obesity, they  also:  Help you to manage stress and emotions.  Help you to connect with friends and family.  Improve your self-esteem.  Improve your sleep.  Prevent long-term health problems.  What can happen if changes are not made? Being obese or overweight can cause you to develop joint or bone problems, which can make it hard for you to stay active or do activities you enjoy. Being obese or overweight also puts stress on your heart and lungs and can lead to health problems like diabetes, heart disease, and some cancers. Where to find more information: Talk with your health care provider or a dietitian about healthy eating and healthy lifestyle choices. You may also find other information through these resources:  U.[place]. Department of Agriculture MyPlate: FormerBoss.no  American Heart Association: www.heart.org  Centers for Disease Control and Prevention: http://www.wolf.info/  Summary  Staying at a healthy weight is important. It helps prevent certain diseases and health problems, such as heart disease, diabetes, joint problems, sleep disorders, and some cancers.  Being obese or overweight can cause you to develop joint or bone problems, which can make it hard for you to stay active or do activities you  enjoy.  You can prevent unhealthy weight gain by eating a healthy diet, exercising regularly, not smoking, limiting alcohol, and getting enough sleep.  Talk with your health care provider or a dietitian for guidance about healthy eating and healthy lifestyle choices. This information is not intended to replace advice given to you by your health care provider. Make sure you discuss any questions you have with your health care provider. Document Released: 08/03/2016 Document Revised: 09/08/2016 Document Reviewed: 09/08/2016 Elsevier Interactive Patient Education  2018 [place] Maintenance, Female Adopting a healthy lifestyle and getting preventive care can go a long way to promote health and wellness. Talk with your health care provider about what schedule of regular examinations is right for you. This is a good chance for you to check in with your provider about disease prevention and staying healthy. In between checkups, there are plenty of things you can do on your own. Experts have done a lot of research about which lifestyle changes and preventive measures are most likely to keep you healthy. Ask your health care provider for more information. Weight and diet Eat a healthy diet  Be sure to include plenty of vegetables, fruits, low-fat dairy products, and lean protein.  Do not eat a lot of foods high in solid fats, added sugars, or salt.  Get regular exercise. This is one of the most important things you can do for your health. ? Most adults should exercise for at least 150 minutes each week. The exercise should increase your heart rate and make you sweat (moderate-intensity exercise). ? Most adults should also do strengthening exercises at least twice a week. This is in addition to the moderate-intensity exercise.  Maintain a healthy weight  Body mass index (BMI) is a measurement that can be used to identify possible weight problems. It estimates body fat based on height and  weight. Your health care provider can help determine your BMI and help you achieve or maintain a healthy weight.  For females 45 years of age and older: ? A BMI below 18.5 is considered underweight. ? A BMI of 18.5 to 24.9 is normal. ? A BMI of 25 to 29.9 is considered overweight. ? A BMI of 30 and above is considered obese.  Watch levels of cholesterol and blood lipids  You should start having  your blood tested for lipids and cholesterol at 39 years of age, then have this test every 5 years.  You may need to have your cholesterol levels checked more often if: ? Your lipid or cholesterol levels are high. ? You are older than 39 years of age. ? You are at high risk for heart disease.  Cancer screening Lung Cancer  Lung cancer screening is recommended for adults 44-34 years old who are at high risk for lung cancer because of a history of smoking.  A yearly low-dose CT scan of the lungs is recommended for people who: ? Currently smoke. ? Have quit within the past 15 years. ? Have at least a 30-pack-year history of smoking. A pack year is smoking an average of one pack of cigarettes a day for 1 year.  Yearly screening should continue until it has been 15 years since you quit.  Yearly screening should stop if you develop a health problem that would prevent you from having lung cancer treatment.  Breast Cancer  Practice breast self-awareness. This means understanding how your breasts normally appear and feel.  It also means doing regular breast self-exams. Let your health care provider know about any changes, no matter how small.  If you are in your 20s or 30s, you should have a clinical breast exam (CBE) by a health care provider every 1-3 years as part of a regular health exam.  If you are 15 or older, have a CBE every year. Also consider having a breast X-ray (mammogram) every year.  If you have a family history of breast cancer, talk to your health care provider about genetic  screening.  If you are at high risk for breast cancer, talk to your health care provider about having an MRI and a mammogram every year.  Breast cancer gene (BRCA) assessment is recommended for women who have family members with BRCA-related cancers. BRCA-related cancers include: ? Breast. ? Ovarian. ? Tubal. ? Peritoneal cancers.  Results of the assessment will determine the need for genetic counseling and BRCA1 and BRCA2 testing.  Cervical Cancer Your health care provider may recommend that you be screened regularly for cancer of the pelvic organs (ovaries, uterus, and vagina). This screening involves a pelvic examination, including checking for microscopic changes to the surface of your cervix (Pap test). You may be encouraged to have this screening done every 3 years, beginning at age 44.  For women ages 65-65, health care providers may recommend pelvic exams and Pap testing every 3 years, or they may recommend the Pap and pelvic exam, combined with testing for human papilloma virus (HPV), every 5 years. Some types of HPV increase your risk of cervical cancer. Testing for HPV may also be done on women of any age with unclear Pap test results.  Other health care providers may not recommend any screening for nonpregnant women who are considered low risk for pelvic cancer and who do not have symptoms. Ask your health care provider if a screening pelvic exam is right for you.  If you have had past treatment for cervical cancer or a condition that could lead to cancer, you need Pap tests and screening for cancer for at least 20 years after your treatment. If Pap tests have been discontinued, your risk factors (such as having a new sexual partner) need to be reassessed to determine if screening should resume. Some women have medical problems that increase the chance of getting cervical cancer. In these cases, your health care provider  may recommend more frequent screening and Pap  tests.  Colorectal Cancer  This type of cancer can be detected and often prevented.  Routine colorectal cancer screening usually begins at 39 years of age and continues through 39 years of age.  Your health care provider may recommend screening at an earlier age if you have risk factors for colon cancer.  Your health care provider may also recommend using home test kits to check for hidden blood in the stool.  A small camera at the end of a tube can be used to examine your colon directly (sigmoidoscopy or colonoscopy). This is done to check for the earliest forms of colorectal cancer.  Routine screening usually begins at age 35.  Direct examination of the colon should be repeated every 5-10 years through 39 years of age. However, you may need to be screened more often if early forms of precancerous polyps or small growths are found.  Skin Cancer  Check your skin from head to toe regularly.  Tell your health care provider about any new moles or changes in moles, especially if there is a change in a mole's shape or color.  Also tell your health care provider if you have a mole that is larger than the size of a pencil eraser.  Always use sunscreen. Apply sunscreen liberally and repeatedly throughout the day.  Protect yourself by wearing long sleeves, pants, a wide-brimmed hat, and sunglasses whenever you are outside.  Heart disease, diabetes, and high blood pressure  High blood pressure causes heart disease and increases the risk of stroke. High blood pressure is more likely to develop in: ? People who have blood pressure in the high end of the normal range (130-139/85-89 mm Hg). ? People who are overweight or obese. ? People who are African American.  If you are 53-1 years of age, have your blood pressure checked every 3-5 years. If you are 26 years of age or older, have your blood pressure checked every year. You should have your blood pressure measured twice-once when you are at  a hospital or clinic, and once when you are not at a hospital or clinic. Record the average of the two measurements. To check your blood pressure when you are not at a hospital or clinic, you can use: ? An automated blood pressure machine at a pharmacy. ? A home blood pressure monitor.  If you are between 74 years and 74 years old, ask your health care provider if you should take aspirin to prevent strokes.  Have regular diabetes screenings. This involves taking a blood sample to check your fasting blood sugar level. ? If you are at a normal weight and have a low risk for diabetes, have this test once every three years after 39 years of age. ? If you are overweight and have a high risk for diabetes, consider being tested at a younger age or more often. Preventing infection Hepatitis B  If you have a higher risk for hepatitis B, you should be screened for this virus. You are considered at high risk for hepatitis B if: ? You were born in a country where hepatitis B is common. Ask your health care provider which countries are considered high risk. ? Your parents were born in a high-risk country, and you have not been immunized against hepatitis B (hepatitis B vaccine). ? You have HIV or AIDS. ? You use needles to inject street drugs. ? You live with someone who has hepatitis B. ? You have  had sex with someone who has hepatitis B. ? You get hemodialysis treatment. ? You take certain medicines for conditions, including cancer, organ transplantation, and autoimmune conditions.  Hepatitis C  Blood testing is recommended for: ? Everyone born from [birth year] through 1967. ? Anyone with known risk factors for hepatitis C.  Sexually transmitted infections (STIs)  You should be screened for sexually transmitted infections (STIs) including gonorrhea and chlamydia if: ? You are sexually active and are younger than 39 years of age. ? You are older than 39 years of age and your health care provider tells  you that you are at risk for this type of infection. ? Your sexual activity has changed since you were last screened and you are at an increased risk for chlamydia or gonorrhea. Ask your health care provider if you are at risk.  If you do not have HIV, but are at risk, it may be recommended that you take a prescription medicine daily to prevent HIV infection. This is called pre-exposure prophylaxis (PrEP). You are considered at risk if: ? You are sexually active and do not regularly use condoms or know the HIV status of your partner(s). ? You take drugs by injection. ? You are sexually active with a partner who has HIV.  Talk with your health care provider about whether you are at high risk of being infected with HIV. If you choose to begin PrEP, you should first be tested for HIV. You should then be tested every 3 months for as long as you are taking PrEP. Pregnancy  If you are premenopausal and you may become pregnant, ask your health care provider about preconception counseling.  If you may become pregnant, take 400 to 800 micrograms (mcg) of folic acid every day.  If you want to prevent pregnancy, talk to your health care provider about birth control (contraception). Osteoporosis and menopause  Osteoporosis is a disease in which the bones lose minerals and strength with aging. This can result in serious bone fractures. Your risk for osteoporosis can be identified using a bone density scan.  If you are 65 years of age or older, or if you are at risk for osteoporosis and fractures, ask your health care provider if you should be screened.  Ask your health care provider whether you should take a calcium or vitamin D supplement to lower your risk for osteoporosis.  Menopause may have certain physical symptoms and risks.  Hormone replacement therapy may reduce some of these symptoms and risks. Talk to your health care provider about whether hormone replacement therapy is right for  you. Follow these instructions at home:  Schedule regular health, dental, and eye exams.  Stay current with your immunizations.  Do not use any tobacco products including cigarettes, chewing tobacco, or electronic cigarettes.  If you are pregnant, do not drink alcohol.  If you are breastfeeding, limit how much and how often you drink alcohol.  Limit alcohol intake to no more than 1 drink per day for nonpregnant women. One drink equals 12 ounces of beer, 5 ounces of wine, or 1 ounces of hard liquor.  Do not use street drugs.  Do not share needles.  Ask your health care provider for help if you need support or information about quitting drugs.  Tell your health care provider if you often feel depressed.  Tell your health care provider if you have ever been abused or do not feel safe at home. This information is not intended to replace advice given  to you by your health care provider. Make sure you discuss any questions you have with your health care provider. Document Released: 02/15/2011 Document Revised: 01/08/2016 Document Reviewed: 05/06/2015 [employer] Interactive Patient Education  2018 [employer].   Diabetes Mellitus and Nutrition When you have diabetes (diabetes mellitus), it is very important to have healthy eating habits because your blood sugar (glucose) levels are greatly affected by what you eat and drink. Eating healthy foods in the appropriate amounts, at about the same times every day, can help you:  Control your blood glucose.  Lower your risk of heart disease.  Improve your blood pressure.  Reach or maintain a healthy weight.  Every person with diabetes is different, and each person has different needs for a meal plan. Your health care provider may recommend that you work with a diet and nutrition specialist (dietitian) to make a meal plan that is best for you. Your meal plan may vary depending on factors such as:  The calories you need.  The medicines  you take.  Your weight.  Your blood glucose, blood pressure, and cholesterol levels.  Your activity level.  Other health conditions you have, such as heart or kidney disease.  How do carbohydrates affect me? Carbohydrates affect your blood glucose level more than any other type of food. Eating carbohydrates naturally increases the amount of glucose in your blood. Carbohydrate counting is a method for keeping track of how many carbohydrates you eat. Counting carbohydrates is important to keep your blood glucose at a healthy level, especially if you use insulin or take certain oral diabetes medicines. It is important to know how many carbohydrates you can safely have in each meal. This is different for every person. Your dietitian can help you calculate how many carbohydrates you should have at each meal and for snack. Foods that contain carbohydrates include:  Bread, cereal, rice, pasta, and crackers.  Potatoes and corn.  Peas, beans, and lentils.  Milk and yogurt.  Fruit and juice.  Desserts, such as cakes, cookies, ice cream, and candy.  How does alcohol affect me? Alcohol can cause a sudden decrease in blood glucose (hypoglycemia), especially if you use insulin or take certain oral diabetes medicines. Hypoglycemia can be a life-threatening condition. Symptoms of hypoglycemia (sleepiness, dizziness, and confusion) are similar to symptoms of having too much alcohol. If your health care provider says that alcohol is safe for you, follow these guidelines:  Limit alcohol intake to no more than 1 drink per day for nonpregnant women and 2 drinks per day for men. One drink equals 12 oz of beer, 5 oz of wine, or 1 oz of hard liquor.  Do not drink on an empty stomach.  Keep yourself hydrated with water, diet soda, or unsweetened iced tea.  Keep in mind that regular soda, juice, and other mixers may contain a lot of sugar and must be counted as carbohydrates.  What are tips for  following this plan? Reading food labels  Start by checking the serving size on the label. The amount of calories, carbohydrates, fats, and other nutrients listed on the label are based on one serving of the food. Many foods contain more than one serving per package.  Check the total grams (g) of carbohydrates in one serving. You can calculate the number of servings of carbohydrates in one serving by dividing the total carbohydrates by 15. For example, if a food has 30 g of total carbohydrates, it would be equal to 2 servings of carbohydrates.  Check the number of grams (g) of saturated and trans fats in one serving. Choose foods that have low or no amount of these fats.  Check the number of milligrams (mg) of sodium in one serving. Most people should limit total sodium intake to less than 2,300 mg per day.  Always check the nutrition information of foods labeled as "low-fat" or "nonfat". These foods may be higher in added sugar or refined carbohydrates and should be avoided.  Talk to your dietitian to identify your daily goals for nutrients listed on the label. Shopping  Avoid buying canned, premade, or processed foods. These foods tend to be high in fat, sodium, and added sugar.  Shop around the outside edge of the grocery store. This includes fresh fruits and vegetables, bulk grains, fresh meats, and fresh dairy. Cooking  Use low-heat cooking methods, such as baking, instead of high-heat cooking methods like deep frying.  Cook using healthy oils, such as olive, canola, or sunflower oil.  Avoid cooking with butter, cream, or high-fat meats. Meal planning  Eat meals and snacks regularly, preferably at the same times every day. Avoid going long periods of time without eating.  Eat foods high in fiber, such as fresh fruits, vegetables, beans, and whole grains. Talk to your dietitian about how many servings of carbohydrates you can eat at each meal.  Eat 4-6 ounces of lean protein each  day, such as lean meat, chicken, fish, eggs, or tofu. 1 ounce is equal to 1 ounce of meat, chicken, or fish, 1 egg, or 1/4 cup of tofu.  Eat some foods each day that contain healthy fats, such as avocado, nuts, seeds, and fish. Lifestyle   Check your blood glucose regularly.  Exercise at least 30 minutes 5 or more days each week, or as told by your health care provider.  Take medicines as told by your health care provider.  Do not use any products that contain nicotine or tobacco, such as cigarettes and e-cigarettes. If you need help quitting, ask your health care provider.  Work with a Social worker or diabetes educator to identify strategies to manage stress and any emotional and social challenges. What are some questions to ask my health care provider?  Do I need to meet with a diabetes educator?  Do I need to meet with a dietitian?  What number can I call if I have questions?  When are the best times to check my blood glucose? Where to find more information:  American Diabetes Association: diabetes.org/food-and-fitness/food  Academy of Nutrition and Dietetics: PokerClues.dk  [employer] of Diabetes and Digestive and Kidney Diseases (NIH): ContactWire.be Summary  A healthy meal plan will help you control your blood glucose and maintain a healthy lifestyle.  Working with a diet and nutrition specialist (dietitian) can help you make a meal plan that is best for you.  Keep in mind that carbohydrates and alcohol have immediate effects on your blood glucose levels. It is important to count carbohydrates and to use alcohol carefully. This information is not intended to replace advice given to you by your health care provider. Make sure you discuss any questions you have with your health care provider. Document Released: 04/29/2005 Document Revised:  09/06/2016 Document Reviewed: 09/06/2016 Elsevier Interactive Patient Education  [employer].

## 2018-03-13 NOTE — Assessment & Plan Note (Signed)
USPSTF grade A and B recommendations reviewed with patient; age-appropriate recommendations, preventive care, screening tests, etc discussed and encouraged; healthy living encouraged; see AVS for patient education given to patient  

## 2018-03-13 NOTE — Progress Notes (Signed)
Patient ID: Bonnie Armstrong, female   DOB: October 25, 1980, 37 y.o.   MRN: [id number]   Subjective:   Bonnie Armstrong is a 37 y.o. female here for a complete physical exam  Interim issues since last visit: delivered last August; breast feeds and then gets ravenous; follows that pattern with her pregnancies; she has had chronic mastitis, episodes back and forth, each breast; managed by OB-GYN  USPSTF grade A and B recommendations Depression:  Depression screen [facility] 2/9 03/13/2018 04/15/2016  Decreased Interest 0 0  Down, Depressed, Hopeless 0 0  PHQ - 2 Score 0 0   Hypertension: BP Readings from Last 3 Encounters:  03/13/18 122/80  04/13/17 128/80  04/08/17 128/68   Obesity: discussed her struggle; discussed nutritionist; she is worried she is diabetic and wants to see what that shows; talked with husband about metformin, but breastfeeding; not having dry mouth or blurred vision  Wt Readings from Last 3 Encounters:  03/13/18 256 lb 12.8 oz (116.5 kg)  04/13/17 239 lb 1.6 oz (108.5 kg)  04/06/17 255 lb (115.7 kg)   BMI Readings from Last 3 Encounters:  03/13/18 44.08 kg/m  04/13/17 41.04 kg/m  04/06/17 43.77 kg/m    Skin cancer: no worrisome moles Lung cancer:  n/a Breast cancer: no lumps Colorectal cancer: one grandparent, no parents; start at 72 or 80 based on guidelines at that time Cervical cancer screening: UTD, last August 2017; next August 2020 BRCA gene screening: family hx of breast and/or ovarian cancer or pancreatitic and/or metastatic prostate cancer? no HIV, hep B, hep C: HIV testing done Feb 2018; not necessary STD testing and prevention (chl/gon/syphilis): not necessary Intimate partner violence: no abuse Contraception: condoms Osteoporosis: n/a Fall prevention/vitamin D: discussed Immunizations: tetanus UTD; discussed HPV vaccine; one sexual partner Diet: typical American diet Exercise: difficult to find time with 7 children; two with autism too Alcohol:  no Tobacco use: non AAA: n/a Aspirin: n/a Glucose: prediabetic at beginning of pregnancy; she had gestational diabetes, never went back and never got rechecked; no dry mouth or blurred vision; gets tired eyes, wears glasses just when doing paperwork; MGF had diabetes; mother is petite  Glucose, Bld  Date Value Ref Range Status  04/07/2017 100 (H) 65 - 99 mg/dL Final  04/06/2017 132 (H) 65 - 99 mg/dL Final  04/21/2016 104 (H) 65 - 99 mg/dL Final   Glucose-Capillary  Date Value Ref Range Status  04/07/2017 157 (H) 65 - 99 mg/dL Final  04/07/2017 130 (H) 65 - 99 mg/dL Final  04/07/2017 100 (H) 65 - 99 mg/dL Final   Lipids:  Lab Results  Component Value Date   CHOL 194 04/21/2016   CHOL 202 (H) 02/28/2015   Lab Results  Component Value Date   HDL 46 04/21/2016   HDL 52 02/28/2015   Lab Results  Component Value Date   LDLCALC 123 04/21/2016   LDLCALC 127 (H) 02/28/2015   Lab Results  Component Value Date   TRIG 124 04/21/2016   TRIG 115 02/28/2015   Lab Results  Component Value Date   CHOLHDL 4.2 04/21/2016   No results found for: LDLDIRECT   Past Medical History:  Diagnosis Date  . Gestational diabetes   . IFG (impaired fasting glucose)   . Morbid obesity ([place]) 02/28/2015  . Prediabetes    Past Surgical History:  Procedure Laterality Date  . CESAREAN SECTION  2002  . WISDOM TOOTH EXTRACTION     Family History  Problem Relation Age of Onset  .  Hypertension Mother   . Hyperlipidemia Mother   . Diabetes Maternal Grandfather   . COPD Paternal Grandmother   . Cancer Paternal Grandfather        colon   Social History   Tobacco Use  . Smoking status: Never Smoker  . Smokeless tobacco: Never Used  Substance Use Topics  . Alcohol use: No  . Drug use: No   Review of Systems  Objective:   Vitals:   03/13/18 1122  BP: 122/80  Resp: 12  Temp: 98.9 F (37.2 C)  TempSrc: Oral  SpO2: 96%  Weight: 256 lb 12.8 oz (116.5 kg)  Height: 5' 4" (1.626 m)    Body mass index is 44.08 kg/m. Wt Readings from Last 3 Encounters:  03/13/18 256 lb 12.8 oz (116.5 kg)  04/13/17 239 lb 1.6 oz (108.5 kg)  04/06/17 255 lb (115.7 kg)   Physical Exam  Constitutional: She appears well-developed and well-nourished.  HENT:  Head: Normocephalic and atraumatic.  Right Ear: Hearing, tympanic membrane, external ear and ear canal normal.  Left Ear: Hearing, tympanic membrane, external ear and ear canal normal.  Eyes: Conjunctivae and EOM are normal. Right eye exhibits no hordeolum. Left eye exhibits no hordeolum. No scleral icterus.  Neck: Carotid bruit is not present. No thyromegaly present.  Cardiovascular: Normal rate, regular rhythm, S1 normal, S2 normal and normal heart sounds.  No extrasystoles are present.  Pulmonary/Chest: Effort normal and breath sounds normal. No respiratory distress. Right breast exhibits no inverted nipple, no mass, no nipple discharge, no skin change and no tenderness. Left breast exhibits no inverted nipple, no mass, no nipple discharge, no skin change and no tenderness. Breasts are symmetrical.  Abdominal: Soft. Normal appearance and bowel sounds are normal. She exhibits no distension, no abdominal bruit, no pulsatile midline mass and no mass. There is no hepatosplenomegaly. There is no tenderness. No hernia.  Musculoskeletal: Normal range of motion. She exhibits no edema.  Lymphadenopathy:       Head (right side): No submandibular adenopathy present.       Head (left side): No submandibular adenopathy present.    She has no cervical adenopathy.    She has no axillary adenopathy.  Neurological: She is alert. She displays no tremor. No cranial nerve deficit. She exhibits normal muscle tone. Gait normal.  Reflex Scores:      Patellar reflexes are 2+ on the right side and 2+ on the left side. Skin: Skin is warm and dry. No bruising and no ecchymosis noted. No cyanosis. No pallor.     Uniformly medium brown papular nevus right  side of chest; nailbeds are mildly pale relative to examiner's  Psychiatric: Her speech is normal and behavior is normal. Thought content normal. Her mood appears not anxious. She does not exhibit a depressed mood.   Diabetic Foot Form - Detailed   Diabetic Foot Exam - detailed Diabetic Foot exam was performed with the following findings:  Yes 03/13/2018 12:18 PM  Visual Foot Exam completed.:  Yes  Pulse Foot Exam completed.:  Yes  Right Dorsalis Pedis:  Present Left Dorsalis Pedis:  Present  Sensory Foot Exam Completed.:  Yes Semmes-Weinstein Monofilament Test R Site 1-Great Toe:  Pos L Site 1-Great Toe:  Pos         Assessment/Plan:   Problem List Items Addressed This Visit      Endocrine   Uncontrolled type 2 diabetes mellitus with hyperglycemia ([place])    Discussed new diagnosis; refer to eye doctor for  baseline eye exam, refer to diabetic education (she never did that with her gestational diabetes); return in 4 weeks; she does NOT want medicine and wants to work on diet and weight loss; discussed risk of MI with out of control DM, will not want her to go above 8 by any means, but willing to let her try diet and weight loss alone if A1c under 8 for now      Relevant Orders   Ambulatory referral to diabetic education     Other   Preventative health care - Primary    USPSTF grade A and B recommendations reviewed with patient; age-appropriate recommendations, preventive care, screening tests, etc discussed and encouraged; healthy living encouraged; see AVS for patient education given to patient       Relevant Orders   CBC with Differential/Platelet   COMPLETE METABOLIC PANEL WITH GFR   Lipid panel   TSH   Ambulatory referral to Optometry   Morbid obesity ([place])    Encouraged weight loss; she sounds motivated with new dx of type 2 DM      Hx of gestational diabetes mellitus, not currently pregnant    Increases the risk of type 2 diabetes      Relevant Orders   POCT HgB  A1C (Completed)       No orders of the defined types were placed in this encounter.  Orders Placed This Encounter  Procedures  . CBC with Differential/Platelet  . COMPLETE METABOLIC PANEL WITH GFR  . Lipid panel  . TSH  . Ambulatory referral to diabetic education    Referral Priority:   Routine    Referral Type:   Consultation    Referral Reason:   Specialty Services Required    Number of Visits Requested:   1  . Ambulatory referral to Optometry    Referral Priority:   Routine    Referral Type:   [facility] [employer] planner)    Referral Reason:   Specialty Services Required    Requested Specialty:   Optometry    Number of Visits Requested:   1  . POCT HgB A1C    Follow up plan: Return in about 1 month (around 04/10/2018) for follow-up visit with Dr. Sanda Klein; one year for physical.  An After Visit Summary was printed and given to the patient.

## 2018-03-13 NOTE — Assessment & Plan Note (Signed)
Increases the risk of type 2 diabetes

## 2018-04-24 ENCOUNTER — Ambulatory Visit: Payer: BLUE CROSS/BLUE SHIELD | Admitting: *Deleted

## 2018-04-26 ENCOUNTER — Ambulatory Visit: Payer: BLUE CROSS/BLUE SHIELD | Admitting: Family Medicine

## 2018-05-15 ENCOUNTER — Ambulatory Visit: Payer: BLUE CROSS/BLUE SHIELD | Admitting: Family Medicine

## 2019-02-01 ENCOUNTER — Encounter: Payer: Self-pay | Admitting: Family Medicine

## 2019-02-22 ENCOUNTER — Other Ambulatory Visit: Payer: Self-pay

## 2019-02-22 ENCOUNTER — Telehealth: Payer: Self-pay

## 2019-02-22 ENCOUNTER — Ambulatory Visit (INDEPENDENT_AMBULATORY_CARE_PROVIDER_SITE_OTHER): Payer: BLUE CROSS/BLUE SHIELD | Admitting: Family Medicine

## 2019-02-22 ENCOUNTER — Encounter: Payer: Self-pay | Admitting: Family Medicine

## 2019-02-22 VITALS — BP 138/87 | HR 93 | Temp 98.8°F

## 2019-02-22 DIAGNOSIS — R0789 Other chest pain: Secondary | ICD-10-CM | POA: Diagnosis not present

## 2019-02-22 DIAGNOSIS — R9431 Abnormal electrocardiogram [ECG] [EKG]: Secondary | ICD-10-CM

## 2019-02-22 DIAGNOSIS — R002 Palpitations: Secondary | ICD-10-CM

## 2019-02-22 DIAGNOSIS — Z8249 Family history of ischemic heart disease and other diseases of the circulatory system: Secondary | ICD-10-CM

## 2019-02-22 DIAGNOSIS — N898 Other specified noninflammatory disorders of vagina: Secondary | ICD-10-CM | POA: Diagnosis not present

## 2019-02-22 DIAGNOSIS — E1165 Type 2 diabetes mellitus with hyperglycemia: Secondary | ICD-10-CM

## 2019-02-22 LAB — UA/M W/RFLX CULTURE, ROUTINE
Bilirubin, UA: NEGATIVE
Ketones, UA: NEGATIVE
Leukocytes,UA: NEGATIVE
Nitrite, UA: NEGATIVE
Protein,UA: NEGATIVE
Specific Gravity, UA: 1.025 (ref 1.005–1.030)
Urobilinogen, Ur: 0.2 mg/dL (ref 0.2–1.0)
pH, UA: 5.5 (ref 5.0–7.5)

## 2019-02-22 LAB — MICROALBUMIN, URINE WAIVED
Creatinine, Urine Waived: 200 mg/dL (ref 10–300)
Microalb, Ur Waived: 80 mg/L — ABNORMAL HIGH (ref 0–19)

## 2019-02-22 LAB — MICROSCOPIC EXAMINATION

## 2019-02-22 LAB — BAYER DCA HB A1C WAIVED: HB A1C (BAYER DCA - WAIVED): 10.6 % — ABNORMAL HIGH (ref <7.0)

## 2019-02-22 MED ORDER — ATORVASTATIN CALCIUM 40 MG PO TABS: 40.0000 mg | ORAL_TABLET | Freq: Every day | ORAL | 3 refills | Status: DC

## 2019-02-22 MED ORDER — FLUCONAZOLE 150 MG PO TABS: 150.0000 mg | ORAL_TABLET | Freq: Every day | ORAL | 1 refills | Status: DC

## 2019-02-22 MED ORDER — LISINOPRIL 5 MG PO TABS: 5.0000 mg | ORAL_TABLET | Freq: Every day | ORAL | 3 refills | Status: DC

## 2019-02-22 MED ORDER — METFORMIN HCL ER (OSM) 500 MG PO TB24: 500.0000 mg | ORAL_TABLET | Freq: Two times a day (BID) | ORAL | 3 refills | Status: DC

## 2019-02-22 NOTE — Telephone Encounter (Signed)
Prior Authorization initiated via CoverMyMeds for metFORMIN HCl ER 500 mg ER tablets Key: [id number]  Waiting for provider to complete/sign progress note to complete PA and upload notes.

## 2019-02-22 NOTE — Progress Notes (Signed)
BP 138/87 (BP Location: Left Arm, Cuff Size: Normal)   Pulse 93   Temp 98.8 F (37.1 C) (Oral)   LMP 02/15/2019 (Approximate)   SpO2 98%    Subjective:    Patient ID: Bonnie Armstrong, female    DOB: 1981-07-17, 38 y.o.   MRN: [id number]  HPI: Bonnie GangChristen L Mohs is a 38 y.o. female who presents today to establish care. [facility], but has not been seen in over a year. Has DM, last A1c 7.8- no labs since July of last year.   Chief Complaint  Patient presents with  . Establish Care  . Diabetes    pt states she was told a year ago that she was diabetic, not put on medication  . Chest Pain    pt states she has been having a strange daily chest tightness and tingling that runs down her left arm  . Vaginitis    pt states she has a external yeast infection   CHEST PAIN Time since onset: A few months Duration: few minutes Onset: sudden Quality: pressure, tightness, weakness, and tingling down her L arm Severity: mild-moderate Location: L side front and back radiating down her L arm Radiation: left arm Episode duration:  Frequency: intermittent Related to exertion: no Activity when pain started: at [place] Trauma: no Anxiety/recent stressors: yes Status: worse Treatments attempted: nothing  Current pain status: pain free Shortness of breath: yes Cough: no Nausea: yes Diaphoresis: yes Heartburn: no Palpitations: yes  DIABETES Hypoglycemic episodes:no Polydipsia/polyuria: no Visual disturbance: yes Chest pain: yes Paresthesias: no Glucose Monitoring: no  Accucheck frequency: Not Checking Taking Insulin?: no Blood Pressure Monitoring: not checking Retinal Examination: Not up to Date Foot Exam: Not up to Date Diabetic Education: Not Completed Pneumovax: Given today Influenza: Not up to Date Aspirin: no  Active Ambulatory Problems    Diagnosis Date Noted  . Morbid obesity (HCC) 02/28/2015  . Palpitations 04/15/2016  . Uncontrolled type 2  diabetes mellitus with hyperglycemia (HCC) 03/13/2018   Resolved Ambulatory Problems    Diagnosis Date Noted  . Impaired fasting glucose 02/28/2015  . Pain and swelling of right knee 04/02/2015  . Unintentional weight change 04/15/2016  . Preventative health care 04/15/2016  . Cervical cancer screening 04/15/2016  . Gestational diabetes mellitus (GDM) 12/15/2016  . Oligohydramnios in third trimester 02/21/2017  . Indication for care in labor and delivery, antepartum 03/18/2017  . Labor and delivery, indication for care 03/22/2017  . Encounter for induction of labor 04/06/2017  . Preventative health care 03/13/2018  . Hx of gestational diabetes mellitus, not currently pregnant 03/13/2018   Past Medical History:  Diagnosis Date  . Gestational diabetes   . IFG (impaired fasting glucose)   . Prediabetes    Past Surgical History:  Procedure Laterality Date  . CESAREAN SECTION  2002  . WISDOM TOOTH EXTRACTION     Outpatient Encounter Medications as of 02/22/2019  Medication Sig  . atorvastatin (LIPITOR) 40 MG tablet Take 1 tablet (40 mg total) by mouth daily.  . fluconazole (DIFLUCAN) 150 MG tablet Take 1 tablet (150 mg total) by mouth daily. May repeat after 24 hours if not better  . lisinopril (ZESTRIL) 5 MG tablet Take 1 tablet (5 mg total) by mouth daily.  . metformin (FORTAMET) 500 MG (OSM) 24 hr tablet Take 1 tablet (500 mg total) by mouth 2 (two) times daily with a meal.  . [DISCONTINUED] dicloxacillin (DYNAPEN) 500 MG capsule Take 1 capsule (500 mg total)  by mouth 4 (four) times daily.  . [DISCONTINUED] Prenatal Vit-Fe Fumarate-FA (PRENATAL MULTIVITAMIN) TABS tablet Take 1 tablet by mouth daily at 12 noon.   No facility-administered encounter medications on file as of 02/22/2019.    No Known Allergies  Social History   Socioeconomic History  . Marital status: Married    Spouse name: Not on file  . Number of children: Not on file  . Years of education: Not on file  .  Highest education level: Not on file  Occupational History  . Not on file  Social Needs  . Financial resource strain: Not on file  . Food insecurity    Worry: Not on file    Inability: Not on file  . Transportation needs    Medical: Not on file    Non-medical: Not on file  Tobacco Use  . Smoking status: Never Smoker  . Smokeless tobacco: Never Used  Substance and Sexual Activity  . Alcohol use: No  . Drug use: No  . Sexual activity: Yes    Birth control/protection: None  Lifestyle  . Physical activity    Days per week: Not on file    Minutes per session: Not on file  . Stress: Not on file  Relationships  . Social Musician on phone: Not on file    Gets together: Not on file    Attends religious service: Not on file    Active member of club or organization: Not on file    Attends meetings of clubs or organizations: Not on file    Relationship status: Not on file  Other Topics Concern  . Not on file  Social History Narrative  . Not on file   Family History  Problem Relation Age of Onset  . Hypertension Mother   . Hyperlipidemia Mother   . Hypothyroidism Mother   . CAD Father   . Heart disease Father   . Diabetes Maternal Grandfather   . COPD Paternal Grandmother   . Cancer Paternal Grandfather        colon    Review of Systems  Constitutional: Negative.   HENT: Negative.   Respiratory: Positive for chest tightness and shortness of breath. Negative for apnea, cough, choking, wheezing and stridor.   Cardiovascular: Positive for chest pain and palpitations. Negative for leg swelling.  Gastrointestinal: Negative.   Genitourinary: Negative for decreased urine volume, difficulty urinating, dyspareunia, dysuria, enuresis, flank pain, frequency, genital sores, hematuria, menstrual problem, pelvic pain, urgency, vaginal bleeding, vaginal discharge and vaginal pain.  Musculoskeletal: Negative.   Psychiatric/Behavioral: Negative.     Per HPI unless  specifically indicated above     Objective:    BP 138/87 (BP Location: Left Arm, Cuff Size: Normal)   Pulse 93   Temp 98.8 F (37.1 C) (Oral)   LMP 02/15/2019 (Approximate)   SpO2 98%   Wt Readings from Last 3 Encounters:  03/13/18 256 lb 12.8 oz (116.5 kg)  04/13/17 239 lb 1.6 oz (108.5 kg)  04/06/17 255 lb (115.7 kg)    Physical Exam Vitals signs and nursing note reviewed.  Constitutional:      General: She is not in acute distress.    Appearance: Normal appearance. She is obese. She is not ill-appearing, toxic-appearing or diaphoretic.  HENT:     Head: Normocephalic and atraumatic.     Right Ear: External ear normal.     Left Ear: External ear normal.     Nose: Nose normal.  Mouth/Throat:     Mouth: Mucous membranes are moist.     Pharynx: Oropharynx is clear.  Eyes:     General: No scleral icterus.       Right eye: No discharge.        Left eye: No discharge.     Extraocular Movements: Extraocular movements intact.     Conjunctiva/sclera: Conjunctivae normal.     Pupils: Pupils are equal, round, and reactive to light.  Neck:     Musculoskeletal: Normal range of motion and neck supple.  Cardiovascular:     Rate and Rhythm: Normal rate and regular rhythm.     Pulses: Normal pulses.     Heart sounds: Normal heart sounds. No murmur. No friction rub. No gallop.   Pulmonary:     Effort: Pulmonary effort is normal. No respiratory distress.     Breath sounds: Normal breath sounds. No stridor. No wheezing, rhonchi or rales.  Chest:     Chest wall: No tenderness.  Musculoskeletal: Normal range of motion.  Skin:    General: Skin is warm and dry.     Capillary Refill: Capillary refill takes less than 2 seconds.     Coloration: Skin is not jaundiced or pale.     Findings: No bruising, erythema, lesion or rash.  Neurological:     General: No focal deficit present.     Mental Status: She is alert and oriented to person, place, and time. Mental status is at baseline.   Psychiatric:        Mood and Affect: Mood normal.        Behavior: Behavior normal.        Thought Content: Thought content normal.        Judgment: Judgment normal.     Results for orders placed or performed in visit on 02/22/19  Microscopic Examination   URINE  Result Value Ref Range   WBC, UA 0-5 0 - 5 /hpf   RBC 0-2 0 - 2 /hpf   Epithelial Cells (non renal) 0-10 0 - 10 /hpf   Mucus, UA Present Not Estab.   Bacteria, UA Few (A) None seen/Few  Bayer DCA Hb A1c Waived  Result Value Ref Range   HB A1C (BAYER DCA - WAIVED) 10.6 (H) <7.0 %  CBC with Differential/Platelet  Result Value Ref Range   WBC 7.2 3.4 - 10.8 x10E3/uL   RBC 4.99 3.77 - 5.28 x10E6/uL   Hemoglobin 13.4 11.1 - 15.9 g/dL   Hematocrit 16.140.6 09.634.0 - 46.6 %   MCV 81 79 - 97 fL   MCH 26.9 26.6 - 33.0 pg   MCHC 33.0 31.5 - 35.7 g/dL   RDW 04.513.1 40.911.7 - 81.115.4 %   Platelets 315 150 - 450 x10E3/uL   Neutrophils 57 Not Estab. %   Lymphs 34 Not Estab. %   Monocytes 6 Not Estab. %   Eos 2 Not Estab. %   Basos 1 Not Estab. %   Neutrophils Absolute 4.2 1.4 - 7.0 x10E3/uL   Lymphocytes Absolute 2.5 0.7 - 3.1 x10E3/uL   Monocytes Absolute 0.4 0.1 - 0.9 x10E3/uL   EOS (ABSOLUTE) 0.1 0.0 - 0.4 x10E3/uL   Basophils Absolute 0.0 0.0 - 0.2 x10E3/uL   Immature Granulocytes 0 Not Estab. %   Immature Grans (Abs) 0.0 0.0 - 0.1 x10E3/uL  Comprehensive metabolic panel  Result Value Ref Range   Glucose 216 (H) 65 - 99 mg/dL   BUN 8 6 - 24 mg/dL   Creatinine,  Ser 0.65 0.57 - 1.00 mg/dL   GFR calc non Af Amer 111 >59 mL/min/1.73   GFR calc Af Amer 128 >59 mL/min/1.73   BUN/Creatinine Ratio 12 9 - 23   Sodium 137 134 - 144 mmol/L   Potassium 4.1 3.5 - 5.2 mmol/L   Chloride 99 96 - 106 mmol/L   CO2 22 20 - 29 mmol/L   Calcium 9.3 8.7 - 10.2 mg/dL   Total Protein 6.8 6.0 - 8.5 g/dL   Albumin 4.2 3.8 - 4.8 g/dL   Globulin, Total 2.6 1.5 - 4.5 g/dL   Albumin/Globulin Ratio 1.6 1.2 - 2.2   Bilirubin Total 0.3 0.0 - 1.2 mg/dL    Alkaline Phosphatase 81 39 - 117 IU/L   AST 17 0 - 40 IU/L   ALT 25 0 - 32 IU/L  Lipid Panel w/o Chol/HDL Ratio  Result Value Ref Range   Cholesterol, Total 207 (H) 100 - 199 mg/dL   Triglycerides 186 (H) 0 - 149 mg/dL   HDL 41 >39 mg/dL   VLDL Cholesterol Cal 37 5 - 40 mg/dL   LDL Calculated 129 (H) 0 - 99 mg/dL  Microalbumin, Urine Waived  Result Value Ref Range   Microalb, Ur Waived 80 (H) 0 - 19 mg/L   Creatinine, Urine Waived 200 10 - 300 mg/dL   Microalb/Creat Ratio 30-300 (H) <30 mg/g  TSH  Result Value Ref Range   TSH 1.240 0.450 - 4.500 uIU/mL  UA/M w/rflx Culture, Routine   Specimen: Urine   URINE  Result Value Ref Range   Specific Gravity, UA 1.025 1.005 - 1.030   pH, UA 5.5 5.0 - 7.5   Color, UA Yellow Yellow   Appearance Ur Clear Clear   Leukocytes,UA Negative Negative   Protein,UA Negative Negative/Trace   Glucose, UA 1+ (A) Negative   Ketones, UA Negative Negative   RBC, UA Trace (A) Negative   Bilirubin, UA Negative Negative   Urobilinogen, Ur 0.2 0.2 - 1.0 mg/dL   Nitrite, UA Negative Negative   Microscopic Examination See below:       Assessment & Plan:   Problem List Items Addressed This Visit      Endocrine   Uncontrolled type 2 diabetes mellitus with hyperglycemia (HCC)    A1c came back today at 10.6- will start metformin, lisinopril and atorvastatin and recheck 1 month. Call with any concerns. Monitor closely.      Relevant Medications   metformin (FORTAMET) 500 MG (OSM) 24 hr tablet   lisinopril (ZESTRIL) 5 MG tablet   atorvastatin (LIPITOR) 40 MG tablet   Other Relevant Orders   Bayer DCA Hb A1c Waived (Completed)   Comprehensive metabolic panel (Completed)   Lipid Panel w/o Chol/HDL Ratio (Completed)   Microalbumin, Urine Waived (Completed)   UA/M w/rflx Culture, Routine (Completed)     Other   Palpitations    Checking labs. To see cardiology. Call with any concerns.       Relevant Orders   CBC with Differential/Platelet  (Completed)   Comprehensive metabolic panel (Completed)   TSH (Completed)   Ambulatory referral to Cardiology    Other Visit Diagnoses    Chest tightness    -  Primary   EKG shows flipped t-waves, given family history, uncontrolled DM, will get her into see cardiology. Await their input.    Relevant Orders   EKG 12-Lead (Completed)   Ambulatory referral to Cardiology   Vaginal discharge       Yeast infection likely  due to high sugars. Will treat with diflucan. Call if not improving.    Family history of heart disease       Relevant Orders   Ambulatory referral to Cardiology   Abnormal EKG       Relevant Orders   Ambulatory referral to Cardiology       Follow up plan: Return in about 4 weeks (around 03/22/2019).

## 2019-02-22 NOTE — Patient Instructions (Signed)
Dr Nehemiah Massed 02/23/19 at 12:15. 2Nd floor at [facility]

## 2019-02-23 DIAGNOSIS — E782 Mixed hyperlipidemia: Secondary | ICD-10-CM | POA: Insufficient documentation

## 2019-02-23 DIAGNOSIS — R0602 Shortness of breath: Secondary | ICD-10-CM | POA: Insufficient documentation

## 2019-02-23 LAB — CBC WITH DIFFERENTIAL/PLATELET
Basophils Absolute: 0 10*3/uL (ref 0.0–0.2)
Basos: 1 %
EOS (ABSOLUTE): 0.1 10*3/uL (ref 0.0–0.4)
Eos: 2 %
Hematocrit: 40.6 % (ref 34.0–46.6)
Hemoglobin: 13.4 g/dL (ref 11.1–15.9)
Immature Grans (Abs): 0 10*3/uL (ref 0.0–0.1)
Immature Granulocytes: 0 %
Lymphocytes Absolute: 2.5 10*3/uL (ref 0.7–3.1)
Lymphs: 34 %
MCH: 26.9 pg (ref 26.6–33.0)
MCHC: 33 g/dL (ref 31.5–35.7)
MCV: 81 fL (ref 79–97)
Monocytes Absolute: 0.4 10*3/uL (ref 0.1–0.9)
Monocytes: 6 %
Neutrophils Absolute: 4.2 10*3/uL (ref 1.4–7.0)
Neutrophils: 57 %
Platelets: 315 10*3/uL (ref 150–450)
RBC: 4.99 x10E6/uL (ref 3.77–5.28)
RDW: 13.1 % (ref 11.7–15.4)
WBC: 7.2 10*3/uL (ref 3.4–10.8)

## 2019-02-23 LAB — COMPREHENSIVE METABOLIC PANEL
ALT: 25 IU/L (ref 0–32)
AST: 17 IU/L (ref 0–40)
Albumin/Globulin Ratio: 1.6 (ref 1.2–2.2)
Albumin: 4.2 g/dL (ref 3.8–4.8)
Alkaline Phosphatase: 81 IU/L (ref 39–117)
BUN/Creatinine Ratio: 12 (ref 9–23)
BUN: 8 mg/dL (ref 6–24)
Bilirubin Total: 0.3 mg/dL (ref 0.0–1.2)
CO2: 22 mmol/L (ref 20–29)
Calcium: 9.3 mg/dL (ref 8.7–10.2)
Chloride: 99 mmol/L (ref 96–106)
Creatinine, Ser: 0.65 mg/dL (ref 0.57–1.00)
GFR calc Af Amer: 128 mL/min/{1.73_m2} (ref >59)
GFR calc non Af Amer: 111 mL/min/{1.73_m2} (ref >59)
Globulin, Total: 2.6 g/dL (ref 1.5–4.5)
Glucose: 216 mg/dL — ABNORMAL HIGH (ref 65–99)
Potassium: 4.1 mmol/L (ref 3.5–5.2)
Sodium: 137 mmol/L (ref 134–144)
Total Protein: 6.8 g/dL (ref 6.0–8.5)

## 2019-02-23 LAB — LIPID PANEL W/O CHOL/HDL RATIO
Cholesterol, Total: 207 mg/dL — ABNORMAL HIGH (ref 100–199)
HDL: 41 mg/dL (ref >39)
LDL Calculated: 129 mg/dL — ABNORMAL HIGH (ref 0–99)
Triglycerides: 186 mg/dL — ABNORMAL HIGH (ref 0–149)
VLDL Cholesterol Cal: 37 mg/dL (ref 5–40)

## 2019-02-23 LAB — TSH: TSH: 1.24 u[IU]/mL (ref 0.450–4.500)

## 2019-02-25 ENCOUNTER — Encounter: Payer: Self-pay | Admitting: Family Medicine

## 2019-02-25 NOTE — Assessment & Plan Note (Addendum)
A1c came back today at 10.6- will start metformin, lisinopril and atorvastatin and recheck 1 month. Call with any concerns. Monitor closely.

## 2019-02-25 NOTE — Assessment & Plan Note (Signed)
Checking labs. To see cardiology. Call with any concerns.

## 2019-02-26 ENCOUNTER — Encounter: Payer: Self-pay | Admitting: Family Medicine

## 2019-03-06 NOTE — Telephone Encounter (Signed)
PA denied will receive fax.

## 2019-03-07 ENCOUNTER — Other Ambulatory Visit: Payer: Self-pay | Admitting: Family Medicine

## 2019-03-07 ENCOUNTER — Encounter: Payer: Self-pay | Admitting: Family Medicine

## 2019-03-07 ENCOUNTER — Ambulatory Visit (INDEPENDENT_AMBULATORY_CARE_PROVIDER_SITE_OTHER): Payer: BLUE CROSS/BLUE SHIELD | Admitting: Family Medicine

## 2019-03-07 ENCOUNTER — Other Ambulatory Visit: Payer: Self-pay

## 2019-03-07 DIAGNOSIS — R6889 Other general symptoms and signs: Secondary | ICD-10-CM

## 2019-03-07 DIAGNOSIS — J0100 Acute maxillary sinusitis, unspecified: Secondary | ICD-10-CM | POA: Diagnosis not present

## 2019-03-07 DIAGNOSIS — Z20822 Contact with and (suspected) exposure to covid-19: Secondary | ICD-10-CM

## 2019-03-07 MED ORDER — METFORMIN HCL ER 500 MG PO TB24: 500.0000 mg | ORAL_TABLET | Freq: Two times a day (BID) | ORAL | 3 refills | Status: DC

## 2019-03-07 MED ORDER — AMOXICILLIN-POT CLAVULANATE 875-125 MG PO TABS: 1.0000 | ORAL_TABLET | Freq: Two times a day (BID) | ORAL | 0 refills | Status: DC

## 2019-03-07 NOTE — Progress Notes (Signed)
LMP 02/15/2019 (Approximate)    Subjective:    Patient ID: Bonnie Armstrong, female    DOB: 05-02-1981, 38 y.o.   MRN: [id number]  HPI: Bonnie Armstrong is a 38 y.o. female  Chief Complaint  Patient presents with  . COVID    Exposed Saturday the 11th. Symptoms began Wednesday 15th. Fatigue, Fever, Body aches, Lost taste/smell    . This visit was completed via WebEx due to the restrictions of the COVID-19 pandemic. All issues as above were discussed and addressed. Physical exam was done as above through visual confirmation on WebEx. If it was felt that the patient should be evaluated in the office, they were directed there. The patient verbally consented to this visit. . Location of the patient: home . Location of the provider: work . Those involved with this call:  . Provider: Roosvelt Maser, PA-C . CMA: Sheilah Mins, CMA . Front Desk/Registration: Harriet Pho  . Time spent on call: 25 minutes with patient face to face via video conference. More than 50% of this time was spent in counseling and coordination of care. 5 minutes total spent in review of patient's record and preparation of their chart. I verified patient identity using two factors (patient name and date of birth). Patient consents verbally to being seen via telemedicine visit today.   Her children went to summer camp 2 weeks ago and then received a message saying some of the kids there had tested positive for COVID 19. Several days after that her son started having sick sxs, and several days later daughter and she and her husband had sxs as well. Body aches, fever, fatigue, loss of taste and smell, nausea, diarrhea. Started using budesonide in nebulizer machine which seems to be helping some with the chest tightness and pressure. Now having left sided sinus pain and pressure, teeth hurt, headache, nasal congestion worsening the past few days. Is about 9-10 days into sxs at this point in total. Denies Cp, SOB, intolerance of PO  intake.   Relevant past medical, surgical, family and social history reviewed and updated as indicated. Interim medical history since our last visit reviewed. Allergies and medications reviewed and updated.  Review of Systems  Per HPI unless specifically indicated above     Objective:    LMP 02/15/2019 (Approximate)   Wt Readings from Last 3 Encounters:  03/13/18 256 lb 12.8 oz (116.5 kg)  04/13/17 239 lb 1.6 oz (108.5 kg)  04/06/17 255 lb (115.7 kg)    Physical Exam Vitals signs and nursing note reviewed.  Constitutional:      General: She is not in acute distress.    Appearance: Normal appearance.  HENT:     Head: Atraumatic.     Right Ear: External ear normal.     Left Ear: External ear normal.     Nose: Congestion present.     Mouth/Throat:     Mouth: Mucous membranes are moist.     Pharynx: Posterior oropharyngeal erythema present.  Eyes:     Extraocular Movements: Extraocular movements intact.     Conjunctiva/sclera: Conjunctivae normal.  Neck:     Musculoskeletal: Normal range of motion.  Cardiovascular:     Comments: Unable to assess via virtual visit Pulmonary:     Effort: Pulmonary effort is normal. No respiratory distress.  Musculoskeletal: Normal range of motion.  Skin:    General: Skin is dry.     Findings: No erythema.  Neurological:     Mental Status: She is  alert and oriented to person, place, and time.  Psychiatric:        Mood and Affect: Mood normal.        Thought Content: Thought content normal.        Judgment: Judgment normal.     Results for orders placed or performed in visit on 02/22/19  Microscopic Examination   URINE  Result Value Ref Range   WBC, UA 0-5 0 - 5 /hpf   RBC 0-2 0 - 2 /hpf   Epithelial Cells (non renal) 0-10 0 - 10 /hpf   Mucus, UA Present Not Estab.   Bacteria, UA Few (A) None seen/Few  Bayer DCA Hb A1c Waived  Result Value Ref Range   HB A1C (BAYER DCA - WAIVED) 10.6 (H) <7.0 %  CBC with Differential/Platelet   Result Value Ref Range   WBC 7.2 3.4 - 10.8 x10E3/uL   RBC 4.99 3.77 - 5.28 x10E6/uL   Hemoglobin 13.4 11.1 - 15.9 g/dL   Hematocrit 65.7 84.6 - 46.6 %   MCV 81 79 - 97 fL   MCH 26.9 26.6 - 33.0 pg   MCHC 33.0 31.5 - 35.7 g/dL   RDW 96.2 95.2 - 84.1 %   Platelets 315 150 - 450 x10E3/uL   Neutrophils 57 Not Estab. %   Lymphs 34 Not Estab. %   Monocytes 6 Not Estab. %   Eos 2 Not Estab. %   Basos 1 Not Estab. %   Neutrophils Absolute 4.2 1.4 - 7.0 x10E3/uL   Lymphocytes Absolute 2.5 0.7 - 3.1 x10E3/uL   Monocytes Absolute 0.4 0.1 - 0.9 x10E3/uL   EOS (ABSOLUTE) 0.1 0.0 - 0.4 x10E3/uL   Basophils Absolute 0.0 0.0 - 0.2 x10E3/uL   Immature Granulocytes 0 Not Estab. %   Immature Grans (Abs) 0.0 0.0 - 0.1 x10E3/uL  Comprehensive metabolic panel  Result Value Ref Range   Glucose 216 (H) 65 - 99 mg/dL   BUN 8 6 - 24 mg/dL   Creatinine, Ser 3.24 0.57 - 1.00 mg/dL   GFR calc non Af Amer 111 >59 mL/min/1.73   GFR calc Af Amer 128 >59 mL/min/1.73   BUN/Creatinine Ratio 12 9 - 23   Sodium 137 134 - 144 mmol/L   Potassium 4.1 3.5 - 5.2 mmol/L   Chloride 99 96 - 106 mmol/L   CO2 22 20 - 29 mmol/L   Calcium 9.3 8.7 - 10.2 mg/dL   Total Protein 6.8 6.0 - 8.5 g/dL   Albumin 4.2 3.8 - 4.8 g/dL   Globulin, Total 2.6 1.5 - 4.5 g/dL   Albumin/Globulin Ratio 1.6 1.2 - 2.2   Bilirubin Total 0.3 0.0 - 1.2 mg/dL   Alkaline Phosphatase 81 39 - 117 IU/L   AST 17 0 - 40 IU/L   ALT 25 0 - 32 IU/L  Lipid Panel w/o Chol/HDL Ratio  Result Value Ref Range   Cholesterol, Total 207 (H) 100 - 199 mg/dL   Triglycerides 401 (H) 0 - 149 mg/dL   HDL 41 >02 mg/dL   VLDL Cholesterol Cal 37 5 - 40 mg/dL   LDL Calculated 725 (H) 0 - 99 mg/dL  Microalbumin, Urine Waived  Result Value Ref Range   Microalb, Ur Waived 80 (H) 0 - 19 mg/L   Creatinine, Urine Waived 200 10 - 300 mg/dL   Microalb/Creat Ratio 30-300 (H) <30 mg/g  TSH  Result Value Ref Range   TSH 1.240 0.450 - 4.500 uIU/mL  UA/M w/rflx Culture,  Routine  Specimen: Urine   URINE  Result Value Ref Range   Specific Gravity, UA 1.025 1.005 - 1.030   pH, UA 5.5 5.0 - 7.5   Color, UA Yellow Yellow   Appearance Ur Clear Clear   Leukocytes,UA Negative Negative   Protein,UA Negative Negative/Trace   Glucose, UA 1+ (A) Negative   Ketones, UA Negative Negative   RBC, UA Trace (A) Negative   Bilirubin, UA Negative Negative   Urobilinogen, Ur 0.2 0.2 - 1.0 mg/dL   Nitrite, UA Negative Negative   Microscopic Examination See below:       Assessment & Plan:   Problem List Items Addressed This Visit    None    Visit Diagnoses    Suspected Covid-19 Virus Infection    -  Primary   About 10 days into sxs, has been quarantined. Declines testing, continue quarantine until sxs resolved. Supportive care reviewed   Acute maxillary sinusitis, recurrence not specified       Tx with augmentin, sinus rinses, mucinex, rest. F/u if not improving   Relevant Medications   amoxicillin-clavulanate (AUGMENTIN) 875-125 MG tablet       Follow up plan: Return if symptoms worsen or fail to improve.

## 2019-03-09 ENCOUNTER — Other Ambulatory Visit: Payer: Self-pay | Admitting: Family Medicine

## 2019-03-09 ENCOUNTER — Encounter: Payer: Self-pay | Admitting: Family Medicine

## 2019-03-09 MED ORDER — FLUCONAZOLE 150 MG PO TABS: 150.0000 mg | ORAL_TABLET | Freq: Once | ORAL | 0 refills | Status: AC

## 2019-03-13 ENCOUNTER — Encounter: Payer: Self-pay | Admitting: Family Medicine

## 2019-03-19 MED ORDER — NYSTATIN 100000 UNIT/GM EX POWD: Freq: Four times a day (QID) | CUTANEOUS | 0 refills | Status: DC

## 2019-03-20 LAB — HM DIABETES EYE EXAM

## 2019-05-06 ENCOUNTER — Encounter: Payer: Self-pay | Admitting: Family Medicine

## 2019-07-19 ENCOUNTER — Ambulatory Visit (INDEPENDENT_AMBULATORY_CARE_PROVIDER_SITE_OTHER): Payer: BLUE CROSS/BLUE SHIELD | Admitting: Family Medicine

## 2019-07-19 ENCOUNTER — Other Ambulatory Visit: Payer: Self-pay

## 2019-07-19 ENCOUNTER — Encounter: Payer: Self-pay | Admitting: Family Medicine

## 2019-07-19 DIAGNOSIS — E1165 Type 2 diabetes mellitus with hyperglycemia: Secondary | ICD-10-CM | POA: Diagnosis not present

## 2019-07-19 DIAGNOSIS — Z23 Encounter for immunization: Secondary | ICD-10-CM | POA: Diagnosis not present

## 2019-07-19 DIAGNOSIS — E782 Mixed hyperlipidemia: Secondary | ICD-10-CM

## 2019-07-19 NOTE — Assessment & Plan Note (Signed)
Will get her in for A1c. Adjust medicine as needed. Continue to monitor.

## 2019-07-19 NOTE — Progress Notes (Signed)
There were no vitals taken for this visit.   Subjective:    Patient ID: Bonnie Armstrong, female    DOB: October 11, 1980, 38 y.o.   MRN: [id number]  HPI: APRYL Armstrong is a 38 y.o. female presents today after last being seen in July.  Chief Complaint  Patient presents with  . Diabetes   Had COVID earlier in the year. Feeling better from that.   DIABETES- has been taking her metformin, taking both at night, tolerating well. Has been feeling a lot better with the heart flutters, has not gotten back in with cardiology yet. She is still breast feeding Hypoglycemic episodes:no Polydipsia/polyuria: no Visual disturbance: no Chest pain: no Paresthesias: no Glucose Monitoring: no  Accucheck frequency: Not Checking Taking Insulin?: no Blood Pressure Monitoring: not checking Retinal Examination: Up to Date Foot Exam: Not up to Date Diabetic Education: Completed Pneumovax: Not up to Date Influenza: Not up to Date Aspirin: no  Relevant past medical, surgical, family and social history reviewed and updated as indicated. Interim medical history since our last visit reviewed. Allergies and medications reviewed and updated.  Review of Systems  Constitutional: Negative.   HENT: Negative.   Eyes: Negative.   Respiratory: Negative.   Cardiovascular: Negative.   Gastrointestinal: Positive for diarrhea. Negative for abdominal distention, abdominal pain, anal bleeding, blood in stool, constipation, nausea, rectal pain and vomiting.  Psychiatric/Behavioral: Negative.     Per HPI unless specifically indicated above     Objective:    There were no vitals taken for this visit.  Wt Readings from Last 3 Encounters:  03/13/18 256 lb 12.8 oz (116.5 kg)  04/13/17 239 lb 1.6 oz (108.5 kg)  04/06/17 255 lb (115.7 kg)    Physical Exam Vitals signs and nursing note reviewed.  Constitutional:      General: She is not in acute distress.    Appearance: Normal appearance. She is not  ill-appearing, toxic-appearing or diaphoretic.  HENT:     Head: Normocephalic and atraumatic.     Right Ear: External ear normal.     Left Ear: External ear normal.     Nose: Nose normal.     Mouth/Throat:     Mouth: Mucous membranes are moist.     Pharynx: Oropharynx is clear.  Eyes:     General: No scleral icterus.       Right eye: No discharge.        Left eye: No discharge.     Conjunctiva/sclera: Conjunctivae normal.     Pupils: Pupils are equal, round, and reactive to light.  Neck:     Musculoskeletal: Normal range of motion.  Pulmonary:     Effort: Pulmonary effort is normal. No respiratory distress.     Comments: Speaking in full sentences Musculoskeletal: Normal range of motion.  Skin:    Coloration: Skin is not jaundiced or pale.     Findings: No bruising, erythema, lesion or rash.  Neurological:     Mental Status: She is alert and oriented to person, place, and time. Mental status is at baseline.  Psychiatric:        Mood and Affect: Mood normal.        Behavior: Behavior normal.        Thought Content: Thought content normal.        Judgment: Judgment normal.     Results for orders placed or performed in visit on 03/21/19  HM DIABETES EYE EXAM  Result Value Ref Range   HM  Diabetic Eye Exam No Retinopathy No Retinopathy      Assessment & Plan:   Problem List Items Addressed This Visit      Endocrine   Uncontrolled type 2 diabetes mellitus with hyperglycemia (HCC) - Primary    Will get her in for A1c. Adjust medicine as needed. Continue to monitor.       Relevant Orders   Bayer DCA Hb A1c Waived     Other   Hyperlipidemia, mixed    Still breast feeding. Will start statin when stops.        Other Visit Diagnoses    Immunization due       Will get flu and pneumovax when she comes in for her labs early next week.    Relevant Orders   Flu Vaccine QUAD 6+ mos PF IM (Fluarix Quad PF)   Pneumococcal polysaccharide vaccine 23-valent greater than or  equal to 2yo subcutaneous/IM       Follow up plan: Return Pending results.   . This visit was completed via Doximity due to the restrictions of the COVID-19 pandemic. All issues as above were discussed and addressed. Physical exam was done as above through visual confirmation on Doximity. If it was felt that the patient should be evaluated in the office, they were directed there. The patient verbally consented to this visit. . Location of the patient: home . Location of the provider: work . Those involved with this call:  . Provider: Olevia Perches, DO . CMA: Tiffany Reel, CMA . Front Desk/Registration: Adela Ports  . Time spent on call: 15 minutes with patient face to face via video conference. More than 50% of this time was spent in counseling and coordination of care. 23 minutes total spent in review of patient's record and preparation of their chart.

## 2019-07-19 NOTE — Patient Instructions (Signed)
Influenza (Flu) Vaccine (Inactivated or Recombinant): What You Need to Know 1. Why get vaccinated? Influenza vaccine can prevent influenza (flu). Flu is a contagious disease that spreads around the [place] every year, usually between October and May. Anyone can get the flu, but it is more dangerous for some people. Infants and young children, people 40 years of age and older, pregnant women, and people with certain health conditions or a weakened immune system are at greatest risk of flu complications. Pneumonia, bronchitis, sinus infections and ear infections are examples of flu-related complications. If you have a medical condition, such as heart disease, cancer or diabetes, flu can make it worse. Flu can cause fever and chills, sore throat, muscle aches, fatigue, cough, headache, and runny or stuffy nose. Some people may have vomiting and diarrhea, though this is more common in children than adults. Each year thousands of people in the [place] die from flu, and many more are hospitalized. Flu vaccine prevents millions of illnesses and flu-related visits to the doctor each year. 2. Influenza vaccine CDC recommends everyone 40 months of age and older get vaccinated every flu season. Children 6 months through 2 years of age may need 2 doses during a single flu season. Everyone else needs only 1 dose each flu season. It takes about 2 weeks for protection to develop after vaccination. There are many flu viruses, and they are always changing. Each year a new flu vaccine is made to protect against three or four viruses that are likely to cause disease in the upcoming flu season. Even when the vaccine doesn't exactly match these viruses, it may still provide some protection. Influenza vaccine does not cause flu. Influenza vaccine may be given at the same time as other vaccines. 3. Talk with your health care provider Tell your vaccine provider if the person getting the vaccine:  Has had an  allergic reaction after a previous dose of influenza vaccine, or has any severe, life-threatening allergies.  Has ever had Guillain-Barr Syndrome (also called GBS). In some cases, your health care provider may decide to postpone influenza vaccination to a future visit. People with minor illnesses, such as a cold, may be vaccinated. People who are moderately or severely ill should usually wait until they recover before getting influenza vaccine. Your health care provider can give you more information. 4. Risks of a vaccine reaction  Soreness, redness, and swelling where shot is given, fever, muscle aches, and headache can happen after influenza vaccine.  There may be a very small increased risk of Guillain-Barr Syndrome (GBS) after inactivated influenza vaccine (the flu shot). Young children who get the flu shot along with pneumococcal vaccine (PCV13), and/or DTaP vaccine at the same time might be slightly more likely to have a seizure caused by fever. Tell your health care provider if a child who is getting flu vaccine has ever had a seizure. People sometimes faint after medical procedures, including vaccination. Tell your provider if you feel dizzy or have vision changes or ringing in the ears. As with any medicine, there is a very remote chance of a vaccine causing a severe allergic reaction, other serious injury, or death. 5. What if there is a serious problem? An allergic reaction could occur after the vaccinated person leaves the clinic. If you see signs of a severe allergic reaction (hives, swelling of the face and throat, difficulty breathing, a fast heartbeat, dizziness, or weakness), call 9-1-1 and get the person to the nearest hospital. For other signs that  concern you, call your health care provider. Adverse reactions should be reported to the Vaccine Adverse Event Reporting System (VAERS). Your health care provider will usually file this report, or you can do it yourself. Visit the  VAERS website at www.vaers.SamedayNews.es or call 364 509 8295.VAERS is only for reporting reactions, and VAERS staff do not give medical advice. 6. The National Vaccine Injury Compensation Program The [employer] Vaccine Injury Compensation Program (VICP) is a federal program that was created to compensate people who may have been injured by certain vaccines. Visit the VICP website at GoldCloset.com.ee or call 347-546-4834 to learn about the program and about filing a claim. There is a time limit to file a claim for compensation. 7. How can I learn more?  Ask your healthcare provider.  Call your local or state health department.  Contact the Centers for Disease Control and Prevention (CDC): ? Call 407-431-3257 (1-800-CDC-INFO) or ? Visit CDC's https://gibson.com/ Vaccine Information Statement (Interim) Inactivated Influenza Vaccine (03/30/2018) This information is not intended to replace advice given to you by your health care provider. Make sure you discuss any questions you have with your health care provider. Document Released: 05/27/2006 Document Revised: 11/21/2018 Document Reviewed: 04/03/2018 Elsevier Patient Education  [place]. Pneumococcal Polysaccharide Vaccine (PPSV23): What You Need to Know 1. Why get vaccinated? Pneumococcal polysaccharide vaccine (PPSV23) can prevent pneumococcal disease. Pneumococcal disease refers to any illness caused by pneumococcal bacteria. These bacteria can cause many types of illnesses, including pneumonia, which is an infection of the lungs. Pneumococcal bacteria are one of the most common causes of pneumonia. Besides pneumonia, pneumococcal bacteria can also cause:  Ear infections  Sinus infections  Meningitis (infection of the tissue covering the brain and spinal cord)  Bacteremia (bloodstream infection) Anyone can get pneumococcal disease, but children under 46 years of age, people with certain medical conditions, adults 40  years or older, and cigarette smokers are at the highest risk. Most pneumococcal infections are mild. However, some can result in long-term problems, such as brain damage or hearing loss. Meningitis, bacteremia, and pneumonia caused by pneumococcal disease can be fatal. 2. PPSV23 PPSV23 protects against 23 types of bacteria that cause pneumococcal disease. PPSV23 is recommended for:  All adults 6 years or older,  Anyone 2 years or older with certain medical conditions that can lead to an increased risk for pneumococcal disease. Most people need only one dose of PPSV23. A second dose of PPSV23, and another type of pneumococcal vaccine called PCV13, are recommended for certain high-risk groups. Your health care provider can give you more information. People 65 years or older should get a dose of PPSV23 even if they have already gotten one or more doses of the vaccine before they turned 70. 3. Talk with your health care provider Tell your vaccine provider if the person getting the vaccine:  Has had an allergic reaction after a previous dose of PPSV23, or has any severe, life-threatening allergies. In some cases, your health care provider may decide to postpone PPSV23 vaccination to a future visit. People with minor illnesses, such as a cold, may be vaccinated. People who are moderately or severely ill should usually wait until they recover before getting PPSV23. Your health care provider can give you more information. 4. Risks of a vaccine reaction  Redness or pain where the shot is given, feeling tired, fever, or muscle aches can happen after PPSV23. People sometimes faint after medical procedures, including vaccination. Tell your provider if you feel dizzy or have vision changes  or ringing in the ears. As with any medicine, there is a very remote chance of a vaccine causing a severe allergic reaction, other serious injury, or death. 5. What if there is a serious problem? An allergic reaction  could occur after the vaccinated person leaves the clinic. If you see signs of a severe allergic reaction (hives, swelling of the face and throat, difficulty breathing, a fast heartbeat, dizziness, or weakness), call 9-1-1 and get the person to the nearest hospital. For other signs that concern you, call your health care provider. Adverse reactions should be reported to the Vaccine Adverse Event Reporting System (VAERS). Your health care provider will usually file this report, or you can do it yourself. Visit the VAERS website at www.vaers.SamedayNews.es or call (506) 121-0027. VAERS is only for reporting reactions, and VAERS staff do not give medical advice. 6. How can I learn more?  Ask your health care provider.  Call your local or state health department.  Contact the Centers for Disease Control and Prevention (CDC): ? Call 908 120 5629 (1-800-CDC-INFO) or ? Visit CDC's website at http://hunter.com/ CDC Vaccine Information Statement PPSV23 Vaccine (06/14/2018) This information is not intended to replace advice given to you by your health care provider. Make sure you discuss any questions you have with your health care provider. Document Released: 05/30/2006 Document Revised: 11/21/2018 Document Reviewed: 03/14/2018 Elsevier Patient Education  2020 [employer].

## 2019-07-19 NOTE — Assessment & Plan Note (Signed)
Still breast feeding. Will start statin when stops.

## 2019-07-23 ENCOUNTER — Other Ambulatory Visit: Payer: Self-pay | Admitting: Family Medicine

## 2019-07-23 ENCOUNTER — Other Ambulatory Visit: Payer: BLUE CROSS/BLUE SHIELD

## 2019-07-23 ENCOUNTER — Other Ambulatory Visit: Payer: Self-pay

## 2019-07-23 ENCOUNTER — Ambulatory Visit (INDEPENDENT_AMBULATORY_CARE_PROVIDER_SITE_OTHER): Payer: BLUE CROSS/BLUE SHIELD

## 2019-07-23 DIAGNOSIS — E1165 Type 2 diabetes mellitus with hyperglycemia: Secondary | ICD-10-CM

## 2019-07-23 DIAGNOSIS — Z23 Encounter for immunization: Secondary | ICD-10-CM | POA: Diagnosis not present

## 2019-07-23 LAB — BAYER DCA HB A1C WAIVED: HB A1C (BAYER DCA - WAIVED): 9.7 % — ABNORMAL HIGH (ref <7.0)

## 2019-07-23 MED ORDER — METFORMIN HCL ER 500 MG PO TB24: 1000.0000 mg | ORAL_TABLET | Freq: Two times a day (BID) | ORAL | 2 refills | Status: DC

## 2019-08-07 ENCOUNTER — Encounter: Payer: Self-pay | Admitting: Family Medicine

## 2019-11-03 ENCOUNTER — Other Ambulatory Visit: Payer: Self-pay | Admitting: Family Medicine

## 2019-11-03 NOTE — Telephone Encounter (Signed)
Requested Prescriptions  Pending Prescriptions Disp Refills  . metFORMIN (GLUCOPHAGE-XR) 500 MG 24 hr tablet [Pharmacy Med Name: METFORMIN HCL ER 500 MG TABLET] 120 tablet 2    Sig: TAKE 2 TABLETS (1,000 MG TOTAL) BY MOUTH 2 (TWO) TIMES DAILY WITH A MEAL.     Endocrinology:  Diabetes - Biguanides Failed - 11/03/2019  9:50 AM      Failed - HBA1C is between 0 and 7.9 and within 180 days    HB A1C (BAYER DCA - WAIVED)  Date Value Ref Range Status  07/23/2019 9.7 (H) <7.0 % Final    Comment:                                          Diabetic Adult            <7.0                                       Healthy Adult        4.3 - 5.7                                                           (DCCT/NGSP) American Diabetes Association's Summary of Glycemic Recommendations for Adults with Diabetes: Hemoglobin A1c <7.0%. More stringent glycemic goals (A1c <6.0%) may further reduce complications at the cost of increased risk of hypoglycemia.          Passed - Cr in normal range and within 360 days    Creat  Date Value Ref Range Status  03/13/2018 0.61 0.50 - 1.10 mg/dL Final   Creatinine, Ser  Date Value Ref Range Status  02/22/2019 0.65 0.57 - 1.00 mg/dL Final         Passed - eGFR in normal range and within 360 days    GFR, Est African American  Date Value Ref Range Status  03/13/2018 132 > OR = 60 mL/min/1.63m Final   GFR calc Af Amer  Date Value Ref Range Status  02/22/2019 128 >59 mL/min/1.73 Final   GFR, Est Non African American  Date Value Ref Range Status  03/13/2018 114 > OR = 60 mL/min/1.758mFinal   GFR calc non Af Amer  Date Value Ref Range Status  02/22/2019 111 >59 mL/min/1.73 Final         Passed - Valid encounter within last 6 months    Recent Outpatient Visits          3 months ago [facility] type [facility]  [place]   8 months ago Suspected Covid-19 Virus Infection   [place] 8 months ago [facility] tight[facility] [place]   1 year [facility]   3 years ago [facility]

## 2019-11-08 ENCOUNTER — Telehealth: Payer: Self-pay | Admitting: Emergency Medicine

## 2019-11-08 DIAGNOSIS — R300 Dysuria: Secondary | ICD-10-CM

## 2019-11-08 MED ORDER — CEPHALEXIN 500 MG PO CAPS: 500.0000 mg | ORAL_CAPSULE | Freq: Two times a day (BID) | ORAL | 0 refills | Status: DC

## 2019-11-08 NOTE — Progress Notes (Signed)

## 2019-11-12 MED ORDER — FLUCONAZOLE 150 MG PO TABS: 150.0000 mg | ORAL_TABLET | Freq: Once | ORAL | 0 refills | Status: AC

## 2019-11-12 NOTE — Addendum Note (Signed)
Addended by: Roxy Horseman B on: 11/12/2019 03:54 PM   Modules accepted: Orders

## 2020-01-16 ENCOUNTER — Encounter: Payer: Self-pay | Admitting: Nurse Practitioner

## 2020-01-24 ENCOUNTER — Encounter: Payer: Self-pay | Admitting: Family Medicine

## 2020-01-24 ENCOUNTER — Other Ambulatory Visit: Payer: Self-pay

## 2020-01-24 ENCOUNTER — Ambulatory Visit (INDEPENDENT_AMBULATORY_CARE_PROVIDER_SITE_OTHER): Payer: 59 | Admitting: Family Medicine

## 2020-01-24 VITALS — BP 154/93 | HR 99 | Temp 99.1°F | Ht 63.39 in | Wt 247.6 lb

## 2020-01-24 DIAGNOSIS — N898 Other specified noninflammatory disorders of vagina: Secondary | ICD-10-CM

## 2020-01-24 DIAGNOSIS — E782 Mixed hyperlipidemia: Secondary | ICD-10-CM

## 2020-01-24 DIAGNOSIS — R002 Palpitations: Secondary | ICD-10-CM | POA: Diagnosis not present

## 2020-01-24 DIAGNOSIS — I10 Essential (primary) hypertension: Secondary | ICD-10-CM

## 2020-01-24 DIAGNOSIS — E1165 Type 2 diabetes mellitus with hyperglycemia: Secondary | ICD-10-CM

## 2020-01-24 LAB — MICROALBUMIN, URINE WAIVED
Creatinine, Urine Waived: 200 mg/dL (ref 10–300)
Microalb, Ur Waived: 80 mg/L — ABNORMAL HIGH (ref 0–19)

## 2020-01-24 LAB — MICROSCOPIC EXAMINATION: RBC, Urine: NONE SEEN /hpf (ref 0–2)

## 2020-01-24 LAB — URINALYSIS, ROUTINE W REFLEX MICROSCOPIC
Bilirubin, UA: NEGATIVE
Leukocytes,UA: NEGATIVE
Nitrite, UA: NEGATIVE
Specific Gravity, UA: 1.025 (ref 1.005–1.030)
Urobilinogen, Ur: 0.2 mg/dL (ref 0.2–1.0)
pH, UA: 5.5 (ref 5.0–7.5)

## 2020-01-24 LAB — BAYER DCA HB A1C WAIVED: HB A1C (BAYER DCA - WAIVED): 10.2 % — ABNORMAL HIGH (ref <7.0)

## 2020-01-24 MED ORDER — FLUCONAZOLE 150 MG PO TABS
150.0000 mg | ORAL_TABLET | Freq: Every day | ORAL | 0 refills | Status: DC
Start: 2020-01-24 — End: 2020-02-20

## 2020-01-24 NOTE — Assessment & Plan Note (Signed)
Stable. Labs drawn today. 

## 2020-01-24 NOTE — Patient Instructions (Addendum)
DASH Eating Plan DASH stands for "Dietary Approaches to Stop Hypertension." The DASH eating plan is a healthy eating plan that has been shown to reduce high blood pressure (hypertension). It may also reduce your risk for type 2 diabetes, heart disease, and stroke. The DASH eating plan may also help with weight loss. What are tips for following this plan?  General guidelines  Avoid eating more than 2,300 mg (milligrams) of salt (sodium) a day. If you have hypertension, you may need to reduce your sodium intake to 1,500 mg a day.  Limit alcohol intake to no more than 1 drink a day for nonpregnant women and 2 drinks a day for men. One drink equals 12 oz of beer, 5 oz of wine, or 1 oz of hard liquor.  Work with your health care provider to maintain a healthy body weight or to lose weight. Ask what an ideal weight is for you.  Get at least 30 minutes of exercise that causes your heart to beat faster (aerobic exercise) most days of the week. Activities may include walking, swimming, or biking.  Work with your health care provider or diet and nutrition specialist (dietitian) to adjust your eating plan to your individual calorie needs. Reading food labels   Check food labels for the amount of sodium per serving. Choose foods with less than 5 percent of the Daily Value of sodium. Generally, foods with less than 300 mg of sodium per serving fit into this eating plan.  To find whole grains, look for the word "whole" as the first word in the ingredient list. Shopping  Buy products labeled as "low-sodium" or "no salt added."  Buy fresh foods. Avoid canned foods and premade or frozen meals. Cooking  Avoid adding salt when cooking. Use salt-free seasonings or herbs instead of table salt or sea salt. Check with your health care provider or pharmacist before using salt substitutes.  Do not fry foods. Cook foods using healthy methods such as baking, boiling, grilling, and broiling instead.  Cook with  heart-healthy oils, such as olive, canola, soybean, or sunflower oil. Meal planning  Eat a balanced diet that includes: ? 5 or more servings of fruits and vegetables each day. At each meal, try to fill half of your plate with fruits and vegetables. ? Up to 6-8 servings of whole grains each day. ? Less than 6 oz of lean meat, poultry, or fish each day. A 3-oz serving of meat is about the same size as a deck of cards. One egg equals 1 oz. ? 2 servings of low-fat dairy each day. ? A serving of nuts, seeds, or beans 5 times each week. ? Heart-healthy fats. Healthy fats called Omega-3 fatty acids are found in foods such as flaxseeds and coldwater fish, like sardines, salmon, and mackerel.  Limit how much you eat of the following: ? Canned or prepackaged foods. ? Food that is high in trans fat, such as fried foods. ? Food that is high in saturated fat, such as fatty meat. ? Sweets, desserts, sugary drinks, and other foods with added sugar. ? Full-fat dairy products.  Do not salt foods before eating.  Try to eat at least 2 vegetarian meals each week.  Eat more home-cooked food and less restaurant, buffet, and fast food.  When eating at a restaurant, ask that your food be prepared with less salt or no salt, if possible. What foods are recommended? The items listed may not be a complete list. Talk with your dietitian about   what dietary choices are best for you. Grains Whole-grain or whole-wheat bread. Whole-grain or whole-wheat pasta. Brown rice. Oatmeal. Quinoa. Bulgur. Whole-grain and low-sodium cereals. Pita bread. Low-fat, low-sodium crackers. Whole-wheat flour tortillas. Vegetables Fresh or frozen vegetables (raw, steamed, roasted, or grilled). Low-sodium or reduced-sodium tomato and vegetable juice. Low-sodium or reduced-sodium tomato sauce and tomato paste. Low-sodium or reduced-sodium canned vegetables. Fruits All fresh, dried, or frozen fruit. Canned fruit in natural juice (without  added sugar). Meat and other protein foods Skinless chicken or turkey. Ground chicken or turkey. Pork with fat trimmed off. Fish and seafood. Egg whites. Dried beans, peas, or lentils. Unsalted nuts, nut butters, and seeds. Unsalted canned beans. Lean cuts of beef with fat trimmed off. Low-sodium, lean deli meat. Dairy Low-fat (1%) or fat-free (skim) milk. Fat-free, low-fat, or reduced-fat cheeses. Nonfat, low-sodium ricotta or cottage cheese. Low-fat or nonfat yogurt. Low-fat, low-sodium cheese. Fats and oils Soft margarine without trans fats. Vegetable oil. Low-fat, reduced-fat, or light mayonnaise and salad dressings (reduced-sodium). Canola, safflower, olive, soybean, and sunflower oils. Avocado. Seasoning and other foods Herbs. Spices. Seasoning mixes without salt. Unsalted popcorn and pretzels. Fat-free sweets. What foods are not recommended? The items listed may not be a complete list. Talk with your dietitian about what dietary choices are best for you. Grains Baked goods made with fat, such as croissants, muffins, or some breads. Dry pasta or rice meal packs. Vegetables Creamed or fried vegetables. Vegetables in a cheese sauce. Regular canned vegetables (not low-sodium or reduced-sodium). Regular canned tomato sauce and paste (not low-sodium or reduced-sodium). Regular tomato and vegetable juice (not low-sodium or reduced-sodium). Pickles. Olives. Fruits Canned fruit in a light or heavy syrup. Fried fruit. Fruit in cream or butter sauce. Meat and other protein foods Fatty cuts of meat. Ribs. Fried meat. Bacon. Sausage. Bologna and other processed lunch meats. Salami. Fatback. Hotdogs. Bratwurst. Salted nuts and seeds. Canned beans with added salt. Canned or smoked fish. Whole eggs or egg yolks. Chicken or turkey with skin. Dairy Whole or 2% milk, cream, and half-and-half. Whole or full-fat cream cheese. Whole-fat or sweetened yogurt. Full-fat cheese. Nondairy creamers. Whipped toppings.  Processed cheese and cheese spreads. Fats and oils Butter. Stick margarine. Lard. Shortening. Ghee. Bacon fat. Tropical oils, such as coconut, palm kernel, or palm oil. Seasoning and other foods Salted popcorn and pretzels. Onion salt, garlic salt, seasoned salt, table salt, and sea salt. Worcestershire sauce. Tartar sauce. Barbecue sauce. Teriyaki sauce. Soy sauce, including reduced-sodium. Steak sauce. Canned and packaged gravies. Fish sauce. Oyster sauce. Cocktail sauce. Horseradish that you find on the shelf. Ketchup. Mustard. Meat flavorings and tenderizers. Bouillon cubes. Hot sauce and Tabasco sauce. Premade or packaged marinades. Premade or packaged taco seasonings. Relishes. Regular salad dressings. Where to find more information:  National Heart, Lung, and Blood Institute: www.nhlbi.nih.gov  American Heart Association: www.heart.org Summary  The DASH eating plan is a healthy eating plan that has been shown to reduce high blood pressure (hypertension). It may also reduce your risk for type 2 diabetes, heart disease, and stroke.  With the DASH eating plan, you should limit salt (sodium) intake to 2,300 mg a day. If you have hypertension, you may need to reduce your sodium intake to 1,500 mg a day.  When on the DASH eating plan, aim to eat more fresh fruits and vegetables, whole grains, lean proteins, low-fat dairy, and heart-healthy fats.  Work with your health care provider or diet and nutrition specialist (dietitian) to adjust your eating plan to your   individual calorie needs. This information is not intended to replace advice given to you by your health care provider. Make sure you discuss any questions you have with your health care provider. Document Revised: 07/15/2017 Document Reviewed: 07/26/2016 Elsevier Patient Education  2020 [employer] Inc.  

## 2020-01-24 NOTE — Assessment & Plan Note (Signed)
A1c has gone in the wrong direction at 10.2 up from 9.7- we will hold her metformin at 500mg  BID and she will wean off breast feeding over the next month. Long discussion today about starting insulin to allow her to wean more slowly. She feels like insulin will be more traumatic than weaning- so will work on that. Recheck 1 month with goal of starting trulicity when she is not breast feeding.

## 2020-01-24 NOTE — Assessment & Plan Note (Signed)
Encouraged diet and exercise with goal of losing 1-2lbs per week. Continue to monitor. Call with any concerns.  

## 2020-01-24 NOTE — Assessment & Plan Note (Signed)
BP running high. Will work on [employer] for 1 month and wean breast feeding. Will start ACE or ARB next visit.

## 2020-01-24 NOTE — Assessment & Plan Note (Signed)
Planning on weaning off breast feeding over the next month. Will start statin when she is weaned.

## 2020-01-24 NOTE — Progress Notes (Signed)
BP (!) 154/93 (BP Location: Left Arm, Patient Position: Sitting, Cuff Size: Normal)   Pulse 99   Temp 99.1 F (37.3 C) (Oral)   Ht 5' 3.39" (1.61 m)   Wt 247 lb 9.6 oz (112.3 kg)   SpO2 96%   BMI 43.33 kg/m    Subjective:    Patient ID: Bonnie Armstrong, female    DOB: [birth year], 39 y.o.   MRN: [id number]  HPI: Bonnie Armstrong is a 39 y.o. female  Chief Complaint  Patient presents with  . vaginal dryness  . Diabetes   Still breast feeding. She states that her daughter just breastfeeds a little bit at night and she is ready to stop. She also notes that she has had a lot of irritation to vaginal area on the labia. No   DIABETES- has only been taking 500mg  BID due to a lot of diarrhea and abdominal upset Hypoglycemic episodes:no Polydipsia/polyuria: yes Visual disturbance: no Chest pain: no Paresthesias: no Glucose Monitoring: no  Accucheck frequency: Not Checking  Fasting glucose:  Post prandial:  Evening:  Before meals: Taking Insulin?: no  Long acting insulin:  Short acting insulin: Blood Pressure Monitoring: not checking Retinal Examination: Not up to Date Foot Exam: done today Diabetic Education: Not Completed Pneumovax: Up to Date Influenza: Up to Date Aspirin: no  HYPERTENSION Hypertension status: uncontrolled  Satisfied with current treatment? yes Duration of hypertension: unknown BP monitoring frequency:  not checking BP medication side effects:  not on anything Previous BP meds: none Aspirin: no Recurrent headaches: no Visual changes: no Palpitations: no Dyspnea: no Chest pain: no Lower extremity edema: no Dizzy/lightheaded: no   Relevant past medical, surgical, family and social history reviewed and updated as indicated. Interim medical history since our last visit reviewed. Allergies and medications reviewed and updated.  Review of Systems  Per HPI unless specifically indicated above     Objective:    BP (!) 154/93 (BP Location:  Left Arm, Patient Position: Sitting, Cuff Size: Normal)   Pulse 99   Temp 99.1 F (37.3 C) (Oral)   Ht 5' 3.39" (1.61 m)   Wt 247 lb 9.6 oz (112.3 kg)   SpO2 96%   BMI 43.33 kg/m   Wt Readings from Last 3 Encounters:  01/24/20 247 lb 9.6 oz (112.3 kg)  03/13/18 256 lb 12.8 oz (116.5 kg)  04/13/17 239 lb 1.6 oz (108.5 kg)    Physical Exam Vitals and nursing note reviewed.  Constitutional:      General: She is not in acute distress.    Appearance: Normal appearance. She is obese. She is not ill-appearing, toxic-appearing or diaphoretic.  HENT:     Head: Normocephalic and atraumatic.     Right Ear: External ear normal.     Left Ear: External ear normal.     Nose: Nose normal.     Mouth/Throat:     Mouth: Mucous membranes are moist.     Pharynx: Oropharynx is clear.  Eyes:     General: No scleral icterus.       Right eye: No discharge.        Left eye: No discharge.     Extraocular Movements: Extraocular movements intact.     Conjunctiva/sclera: Conjunctivae normal.     Pupils: Pupils are equal, round, and reactive to light.  Cardiovascular:     Rate and Rhythm: Normal rate and regular rhythm.     Pulses: Normal pulses.     Heart sounds: Normal heart  sounds. No murmur heard.  No friction rub. No gallop.   Pulmonary:     Effort: Pulmonary effort is normal. No respiratory distress.     Breath sounds: Normal breath sounds. No stridor. No wheezing, rhonchi or rales.  Chest:     Chest wall: No tenderness.  Musculoskeletal:        General: Normal range of motion.     Cervical back: Normal range of motion and neck supple.  Skin:    General: Skin is warm and dry.     Capillary Refill: Capillary refill takes less than 2 seconds.     Coloration: Skin is not jaundiced or pale.     Findings: No bruising, erythema, lesion or rash.  Neurological:     General: No focal deficit present.     Mental Status: She is alert and oriented to person, place, and time. Mental status is at  baseline.  Psychiatric:        Mood and Affect: Mood normal.        Behavior: Behavior normal.        Thought Content: Thought content normal.        Judgment: Judgment normal.     Results for orders placed or performed in visit on 07/23/19  Bayer DCA Hb A1c Waived  Result Value Ref Range   HB A1C (BAYER DCA - WAIVED) 9.7 (H) <7.0 %      Assessment & Plan:   Problem List Items Addressed This Visit      Cardiovascular and Mediastinum   Essential hypertension    BP running high. Will work on [employer] for 1 month and wean breast feeding. Will start ACE or ARB next visit.         Endocrine   Uncontrolled type 2 diabetes mellitus with hyperglycemia (HCC) - Primary    A1c has gone in the wrong direction at 10.2 up from 9.7- we will hold her metformin at 500mg  BID and she will wean off breast feeding over the next month. Long discussion today about starting insulin to allow her to wean more slowly. She feels like insulin will be more traumatic than weaning- so will work on that. Recheck 1 month with goal of starting trulicity when she is not breast feeding.       Relevant Orders   Bayer DCA Hb A1c Waived   Microalbumin, Urine Waived   Comprehensive metabolic panel   CBC with Differential/Platelet   Urinalysis, Routine w reflex microscopic   Referral to Chronic Care Management Services     Other   Morbid obesity ([place])    Encouraged diet and exercise with goal of losing 1-2lbs per week. Continue to monitor. Call with any concerns.       Palpitations    Stable. Labs drawn today.      Relevant Orders   TSH   CBC with Differential/Platelet   Hyperlipidemia, mixed    Planning on weaning off breast feeding over the next month. Will start statin when she is weaned.       Relevant Orders   Comprehensive metabolic panel   Lipid Panel w/o Chol/HDL Ratio    Other Visit Diagnoses    Vaginal irritation       Likely due to glucosuria. Will treat with diflucan and work on  getting her sugars under better control. Will hold on orals until sugars a bit better.    Relevant Orders   Estradiol   LH   FSH   Testosterone,  free, total([facility])       Follow up plan: Return in about 4 weeks (around 02/21/2020) for physical.

## 2020-01-25 ENCOUNTER — Telehealth: Payer: Self-pay | Admitting: Family Medicine

## 2020-01-25 NOTE — Chronic Care Management (AMB) (Signed)
Care Management   Outreach Note  01/25/2020 Name: Bonnie Armstrong MRN: [id number] DOB: Nov 10, 1980  Referred by: Dorcas Carrow, DO Reason for referral :  Care Management (CM Initial outreach unsuccessful)   An unsuccessful telephone outreach was attempted today. The patient was referred to the case management team for assistance with care management and care coordination.   Follow Up Plan: The care management team will reach out to the patient again over the next 7 days.   Elisha Ponder, LPN Health Advisor, Embedded Care Coordination [facility] ??[email or web address] ??[phone]

## 2020-01-28 LAB — TSH: TSH: 1.18 u[IU]/mL (ref 0.450–4.500)

## 2020-01-28 LAB — CBC WITH DIFFERENTIAL/PLATELET
Basophils Absolute: 0 10*3/uL (ref 0.0–0.2)
Basos: 0 %
EOS (ABSOLUTE): 0.2 10*3/uL (ref 0.0–0.4)
Eos: 2 %
Hematocrit: 41.1 % (ref 34.0–46.6)
Hemoglobin: 13.2 g/dL (ref 11.1–15.9)
Immature Grans (Abs): 0 10*3/uL (ref 0.0–0.1)
Immature Granulocytes: 0 %
Lymphocytes Absolute: 2 10*3/uL (ref 0.7–3.1)
Lymphs: 26 %
MCH: 26.8 pg (ref 26.6–33.0)
MCHC: 32.1 g/dL (ref 31.5–35.7)
MCV: 83 fL (ref 79–97)
Monocytes Absolute: 0.4 10*3/uL (ref 0.1–0.9)
Monocytes: 5 %
Neutrophils Absolute: 5.2 10*3/uL (ref 1.4–7.0)
Neutrophils: 67 %
Platelets: 311 10*3/uL (ref 150–450)
RBC: 4.93 x10E6/uL (ref 3.77–5.28)
RDW: 13.4 % (ref 11.7–15.4)
WBC: 7.9 10*3/uL (ref 3.4–10.8)

## 2020-01-28 LAB — COMPREHENSIVE METABOLIC PANEL
ALT: 22 IU/L (ref 0–32)
AST: 18 IU/L (ref 0–40)
Albumin/Globulin Ratio: 1.9 (ref 1.2–2.2)
Albumin: 4.5 g/dL (ref 3.8–4.8)
Alkaline Phosphatase: 78 IU/L (ref 48–121)
BUN/Creatinine Ratio: 17 (ref 9–23)
BUN: 10 mg/dL (ref 6–24)
Bilirubin Total: 0.3 mg/dL (ref 0.0–1.2)
CO2: 22 mmol/L (ref 20–29)
Calcium: 8.9 mg/dL (ref 8.7–10.2)
Chloride: 99 mmol/L (ref 96–106)
Creatinine, Ser: 0.58 mg/dL (ref 0.57–1.00)
GFR calc Af Amer: 133 mL/min/{1.73_m2} (ref >59)
GFR calc non Af Amer: 116 mL/min/{1.73_m2} (ref >59)
Globulin, Total: 2.4 g/dL (ref 1.5–4.5)
Glucose: 265 mg/dL — ABNORMAL HIGH (ref 65–99)
Potassium: 4 mmol/L (ref 3.5–5.2)
Sodium: 134 mmol/L (ref 134–144)
Total Protein: 6.9 g/dL (ref 6.0–8.5)

## 2020-01-28 LAB — LUTEINIZING HORMONE: LH: 4.5 m[IU]/mL

## 2020-01-28 LAB — LIPID PANEL W/O CHOL/HDL RATIO
Cholesterol, Total: 223 mg/dL — ABNORMAL HIGH (ref 100–199)
HDL: 45 mg/dL (ref >39)
LDL Chol Calc (NIH): 149 mg/dL — ABNORMAL HIGH (ref 0–99)
Triglycerides: 159 mg/dL — ABNORMAL HIGH (ref 0–149)
VLDL Cholesterol Cal: 29 mg/dL (ref 5–40)

## 2020-01-28 LAB — TESTOSTERONE, FREE, TOTAL, SHBG
Sex Hormone Binding: 25.3 nmol/L (ref 24.6–122.0)
Testosterone, Free: 3.8 pg/mL (ref 0.0–4.2)
Testosterone: 27 ng/dL (ref 8–60)

## 2020-01-28 LAB — FOLLICLE STIMULATING HORMONE: FSH: 5 m[IU]/mL

## 2020-01-28 LAB — ESTRADIOL: Estradiol: 41.8 pg/mL

## 2020-01-28 NOTE — Chronic Care Management (AMB) (Signed)
Care Management   Note  01/28/2020 Name: [name] MRN: [id number] DOB: 1981-08-13  Bonnie Armstrong is a 39 y.o. year old female who is a primary care patient of Dorcas Carrow, DO. I reached out to Bonnie Armstrong by phone today in response to a referral sent by Ms. Raymond Gurney Griebel's health plan.    Ms. Varley was given information about care management services today including:  1. Care management services include personalized support from designated clinical staff supervised by her physician, including individualized plan of care and coordination with other care providers 2. 24/7 contact phone numbers for assistance for urgent and routine care needs. 3. The patient may stop care management services at any time by phone call to the office staff.  Patient agreed to services and verbal consent obtained.   Follow up plan: Telephone appointment with care management team member scheduled for:02/19/2020  Elisha Ponder, LPN Health Advisor, Embedded Care Coordination [facility] Health Care Management ??[email or web address] ??[phone]

## 2020-02-19 ENCOUNTER — Ambulatory Visit: Payer: 59 | Admitting: Pharmacist

## 2020-02-19 DIAGNOSIS — E1165 Type 2 diabetes mellitus with hyperglycemia: Secondary | ICD-10-CM

## 2020-02-19 NOTE — Chronic Care Management (AMB) (Signed)
  Chronic Care Management   Note  02/19/2020 Name: Bonnie Armstrong MRN: [id number] DOB: 11-May-1981  Bonnie Armstrong is a 39 y.o. year old female who is a primary care patient of Dorcas Carrow, DO. The CCM team was consulted for assistance with chronic disease management and care coordination needs.    Attempted to call patient for appointment as scheduled. Unfortunately, she had just receive a call of a family emergency. Will collaborate w/ Care Guide to call her tomorrow to reschedule appointment with me.   Catie Feliz Beam, PharmD, [facility] Clinical Pharmacist [facility] Practice/[facility] [phone]

## 2020-02-20 ENCOUNTER — Ambulatory Visit: Payer: 59 | Admitting: Pharmacist

## 2020-02-20 ENCOUNTER — Telehealth: Payer: Self-pay | Admitting: Family Medicine

## 2020-02-20 DIAGNOSIS — E1165 Type 2 diabetes mellitus with hyperglycemia: Secondary | ICD-10-CM

## 2020-02-20 NOTE — Chronic Care Management (AMB) (Signed)
Chronic Care Management   Note  02/20/2020 Name: Bonnie Armstrong MRN: [id number] DOB: [birth year]   Subjective:  Bonnie Armstrong is a 39 y.o. year old female who is a primary care patient of Bonnie Roys, DO. The CCM team was consulted for assistance with chronic disease management and care coordination needs.    Contacted patient for medication management review.   Ms. Wiersma was given information about Chronic Care Management services today including:  1. CCM service includes personalized support from designated clinical staff supervised by her physician, including individualized plan of care and coordination with other care providers 2. 24/7 contact phone numbers for assistance for urgent and routine care needs. 3. The patient may stop CCM services at any time (effective at the end of the month) by phone call to the office staff  Review of patient status, including review of consultants reports, laboratory and other test data, was performed as part of comprehensive evaluation and provision of chronic care management services.   SDOH (Social Determinants of Health) assessments and interventions performed:  SDOH Interventions     Most Recent Value  SDOH Interventions  Stress Interventions Provide Counseling, Other (Comment)  [consider LCSW referral moving forward]       Objective:  Lab Results  Component Value Date   CREATININE 0.58 01/24/2020   CREATININE 0.65 02/22/2019   CREATININE 0.61 03/13/2018    Lab Results  Component Value Date   HGBA1C 10.2 (H) 01/24/2020       Component Value Date/Time   CHOL 223 (H) 01/24/2020 1037   TRIG 159 (H) 01/24/2020 1037   HDL 45 01/24/2020 1037   CHOLHDL 4.1 03/13/2018 1151   VLDL 25 04/21/2016 0001   LDLCALC 149 (H) 01/24/2020 1037   LDLCALC 136 (H) 03/13/2018 1151    Clinical ASCVD: No  The 10-year ASCVD risk score Bonnie Bussing DC Jr., et al., 2013) is: 3%   Values used to calculate the score:     Age: 83 years     Sex:  Female     Is Non-Hispanic African American: No     Diabetic: Yes     Tobacco smoker: No     Systolic Blood Pressure: 119 mmHg     Is BP treated: No     HDL Cholesterol: 45 mg/dL     Total Cholesterol: 223 mg/dL    BP Readings from Last 3 Encounters:  01/24/20 (!) 154/93  02/22/19 138/87  03/13/18 122/80    No Known Allergies  Medications Reviewed Today    Reviewed by De Hollingshead, [facility] (Pharmacist) on 02/20/20 at [place] List Status: <None>  Medication Order Taking? Sig Documenting Provider Last Dose Status Informant        Discontinued 02/20/20 1537 (Completed Course)   metFORMIN (GLUCOPHAGE-XR) 500 MG 24 hr tablet [id number] Yes TAKE 2 TABLETS (1,000 MG TOTAL) BY MOUTH 2 (TWO) TIMES DAILY WITH A MEAL.  Patient taking differently: Take 500 mg by mouth 2 (two) times daily with a meal.    Bonnie Roys, DO Taking Active            Med Note De Hollingshead   Wed Feb 20, 2020  3:37 PM)             Assessment:   Goals Addressed              This Visit's Progress     Patient Stated   .  PharmD "I need help with my  sugars" (pt-stated)        CARE PLAN ENTRY (see longitudinal plan of care for additional care plan information)  Current Barriers:  . Diabetes: uncontrolled; complicated by chronic medical conditions including HTN, HLD, most recent A1c 10.2% o Notes DM was originally dx as GDM. At last visit w/ PCP, insulin was recommended, but patient had concerned and preferred to wean her toddler first, then consider alternative pharmacotherapy. Notes she has still not fully weaned.  o Reports not being able to eat regular meals as she has 6 children, 2 are physically/mentally disabled. Specific eating patterns of children result in most snacks being carbs/sweets.   o Very concerned about starting medication. Notes she would prefer to lose weight and control sugars w/ diet/exercise.  . Most recent eGFR: >100 mL/min . Current antihyperglycemic regimen:  metformin XR 500 mg BID - reports significant diarrhea on this dose, even worse on higher doses.  . Reports hyperglycemic symptoms of polyuria, polydipsia . Started Whole 30 ~3 weeks ago. Notes it has been a drastic lifestyle change.  . Current blood glucose readings: not checking . Cardiovascular risk reduction: o Current hypertensive regimen: n/a, though discussion of RAAS agent w/ last PCP visit o Current hyperlipidemia regimen: n/a, though discussion of starting statin once patient has weaned. Last LDL 149 o Current antiplatelet regimen: n/a  Pharmacist Clinical Goal(s):  Marland Kitchen Over the next 90 days, patient will work with CCM team and primary care provider to address optimized medication management  Interventions: . Comprehensive medication review performed, medication list updated in electronic medical record . Inter-disciplinary care team collaboration (see longitudinal plan of care) . Educated on goal A1c, goal fasting glucose, goal 2 hour post prandial glucose, as well as macro/microvascular complications of uncontrolled diabetes. Patient is going to check sugars occasionally between now and next PCP visit to determine current level of control . Discussed patient's fear that if she started insulin, she would be on for the rest of her life. Discussed strategies of starting insulin, getting A1c controlled, and switching to non-insulin agents such as GLP1. Discussed MoA and weight loss benefits, as well as potential for GI side effects. Particularly discussed Rybelsus, if patient concerned about injections. Discussed that if still breastfeeding, though, insulin is the most appropriate option . Discussed choosing protein snacks to keep around the house, as opposed to eating carby snacks that she buys for her children. Will connect w/ RN CM for disease management education and support. Consider LCSW moving forward for caregiver burden support.   Patient Self Care Activities:  . Patient will check  blood glucose daily, document, and provide at future appointments . Patient will take medications as prescribed . Patient will report any questions or concerns to provider   Initial goal documentation        Plan: - Will collaborate w/ RN CM for f/u and support  Catie Darnelle Maffucci, PharmD, [place] [phone]

## 2020-02-20 NOTE — Chronic Care Management (AMB) (Signed)
Care Management   Note  02/20/2020 Name: ASHONDA PANDEY MRN: [id number] DOB: [birth year]  [name] is a 39 y.o. year old female who is a primary care patient of Dorcas Carrow, DO and is actively engaged with the care management team. I reached out to [name] by phone today to assist with re-scheduling an initial visit with the Pharmacist  Follow up plan: Telephone appointment with care management team member scheduled for:02/20/2020  Penne Lash, RMA Care Guide, Embedded Care Coordination [facility]  [place], [place] [address] Direct Dial: [phone] [email or web address]: [email or web address]

## 2020-02-20 NOTE — Patient Instructions (Signed)
Visit Information  Goals Addressed              This Visit's Progress     Patient Stated   .  PharmD "I need help with my sugars" (pt-stated)        CARE PLAN ENTRY (see longitudinal plan of care for additional care plan information)  Current Barriers:  . Diabetes: uncontrolled; complicated by chronic medical conditions including HTN, HLD, most recent A1c 10.2% o Notes DM was originally dx as GDM. At last visit w/ PCP, insulin was recommended, but patient had concerned and preferred to wean her toddler first, then consider alternative pharmacotherapy. Notes she has still not fully weaned.  o Reports not being able to eat regular meals as she has 6 children, 2 are physically/mentally disabled. Specific eating patterns of children result in most snacks being carbs/sweets.   o Very concerned about starting medication. Notes she would prefer to lose weight and control sugars w/ diet/exercise.  . Most recent eGFR: >100 mL/min . Current antihyperglycemic regimen: metformin XR 500 mg BID - reports significant diarrhea on this dose, even worse on higher doses.  . Reports hyperglycemic symptoms of polyuria, polydipsia . Started Whole 30 ~3 weeks ago. Notes it has been a drastic lifestyle change.  . Current blood glucose readings: not checking . Cardiovascular risk reduction: o Current hypertensive regimen: n/a, though discussion of RAAS agent w/ last PCP visit o Current hyperlipidemia regimen: n/a, though discussion of starting statin once patient has weaned. Last LDL 149 o Current antiplatelet regimen: n/a  Pharmacist Clinical Goal(s):  Marland Kitchen Over the next 90 days, patient will work with CCM team and primary care provider to address optimized medication management  Interventions: . Comprehensive medication review performed, medication list updated in electronic medical record . Inter-disciplinary care team collaboration (see longitudinal plan of care) . Educated on goal A1c, goal fasting  glucose, goal 2 hour post prandial glucose, as well as macro/microvascular complications of uncontrolled diabetes. Patient is going to check sugars occasionally between now and next PCP visit to determine current level of control . Discussed patient's fear that if she started insulin, she would be on for the rest of her life. Discussed strategies of starting insulin, getting A1c controlled, and switching to non-insulin agents such as GLP1. Discussed MoA and weight loss benefits, as well as potential for GI side effects. Particularly discussed Rybelsus, if patient concerned about injections. Discussed that if still breastfeeding, though, insulin is the most appropriate option . Discussed choosing protein snacks to keep around the house, as opposed to eating carby snacks that she buys for her children. Will connect w/ RN CM for disease management education and support. Consider LCSW moving forward for caregiver burden support.   Patient Self Care Activities:  . Patient will check blood glucose daily, document, and provide at future appointments . Patient will take medications as prescribed . Patient will report any questions or concerns to provider   Initial goal documentation        The patient verbalized understanding of instructions provided today and declined a print copy of patient instruction materials.    Plan: - Will collaborate w/ RN CM for f/u and support  Catie Darnelle Maffucci, PharmD, [place] [phone]

## 2020-02-27 ENCOUNTER — Encounter: Payer: 59 | Admitting: Family Medicine

## 2020-03-05 ENCOUNTER — Other Ambulatory Visit (HOSPITAL_COMMUNITY)
Admission: RE | Admit: 2020-03-05 | Discharge: 2020-03-05 | Disposition: A | Discharge status: Home / Self Care | Payer: Self-pay | Source: Ambulatory Visit | Attending: Family Medicine | Admitting: Family Medicine

## 2020-03-05 ENCOUNTER — Ambulatory Visit (INDEPENDENT_AMBULATORY_CARE_PROVIDER_SITE_OTHER): Payer: 59 | Admitting: Family Medicine

## 2020-03-05 ENCOUNTER — Other Ambulatory Visit: Payer: Self-pay

## 2020-03-05 ENCOUNTER — Encounter: Payer: Self-pay | Admitting: Family Medicine

## 2020-03-05 VITALS — BP 163/120 | HR 89 | Temp 98.8°F | Ht 63.78 in | Wt 240.8 lb

## 2020-03-05 DIAGNOSIS — K621 Rectal polyp: Secondary | ICD-10-CM

## 2020-03-05 DIAGNOSIS — Z124 Encounter for screening for malignant neoplasm of cervix: Secondary | ICD-10-CM

## 2020-03-05 DIAGNOSIS — E1165 Type 2 diabetes mellitus with hyperglycemia: Secondary | ICD-10-CM

## 2020-03-05 DIAGNOSIS — I10 Essential (primary) hypertension: Secondary | ICD-10-CM

## 2020-03-05 DIAGNOSIS — Z0000 Encounter for general adult medical examination without abnormal findings: Secondary | ICD-10-CM

## 2020-03-05 LAB — URINALYSIS, ROUTINE W REFLEX MICROSCOPIC
Bilirubin, UA: NEGATIVE
Glucose, UA: NEGATIVE
Ketones, UA: NEGATIVE
Leukocytes,UA: NEGATIVE
Nitrite, UA: NEGATIVE
Protein,UA: NEGATIVE
RBC, UA: NEGATIVE
Specific Gravity, UA: 1.025 (ref 1.005–1.030)
Urobilinogen, Ur: 0.2 mg/dL (ref 0.2–1.0)
pH, UA: 6 (ref 5.0–7.5)

## 2020-03-05 MED ORDER — AMLODIPINE BESYLATE 5 MG PO TABS: 5.0000 mg | ORAL_TABLET | Freq: Every day | ORAL | 1 refills | Status: DC

## 2020-03-05 MED ORDER — METFORMIN HCL ER 500 MG PO TB24: 1000.0000 mg | ORAL_TABLET | Freq: Two times a day (BID) | ORAL | 1 refills | Status: DC

## 2020-03-05 NOTE — Progress Notes (Signed)
BP (!) 163/120 (BP Location: Left Arm, Patient Position: Sitting, Cuff Size: Normal)   Pulse 89   Temp 98.8 F (37.1 C) (Oral)   Ht 5' 3.78" (1.62 m)   Wt 240 lb 12.8 oz (109.2 kg)   LMP 02/18/2020 (Approximate)   SpO2 100%   Breastfeeding Yes   BMI 41.62 kg/m    Subjective:    Patient ID: Bonnie Armstrong, female    DOB: 1981-02-15, 39 y.o.   MRN: [id number]  HPI: Bonnie Armstrong is a 39 y.o. female presenting on 03/05/2020 for comprehensive medical examination. Current medical complaints include:   DIABETES- She has been working on weaning her daughter, but she is still breast feeding a little at night. She has been working on the whole thirty program and her sugars have been better, but still high. Fasting in the 170s+. Hypoglycemic episodes:no Polydipsia/polyuria: yes Visual disturbance: yes Chest pain: no Paresthesias: no Glucose Monitoring: yes  Accucheck frequency: Daily  Fasting glucose: 170s+ Taking Insulin?: no Blood Pressure Monitoring: not checking Retinal Examination: Not up to Date Foot Exam: Up to Date Diabetic Education: Not Completed Pneumovax: Up to Date Influenza: Up to Date Aspirin: no   HYPERTENSION Hypertension status: uncontrolled  Satisfied with current treatment? no Duration of hypertension: chronic BP monitoring frequency:  not checking BP medication side effects:  Not on anything Aspirin: no Recurrent headaches: no Visual changes: no Palpitations: no Dyspnea: no Chest pain: no Lower extremity edema: no Dizzy/lightheaded: no  She currently lives with: husband and kids Menopausal Symptoms: no  Depression Screen done today and results listed below:  [facility] 01/24/2020 07/19/2019 02/26/2019 03/13/2018 04/15/2016  Decreased Interest 0 0 0 0 0  Down, Depressed, Hopeless 0 0 0 0 0  PHQ - 2 Score 0 0 0 0 0  Altered sleeping 1 - 1 - -  Tired, decreased energy 2 - 2 - -  Change in appetite 1 - 1 - -  Feeling bad or  failure about yourself  1 - 1 - -  Trouble concentrating 0 - 0 - -  Moving slowly or fidgety/restless 0 - 0 - -  Suicidal thoughts 0 - 0 - -  PHQ-9 Score 5 - 5 - -  Difficult doing work/chores Somewhat difficult - Not difficult at all - -    Past Medical History:  Past Medical History:  Diagnosis Date  . Gestational diabetes   . IFG (impaired fasting glucose)   . Morbid obesity (HCC) 02/28/2015  . Prediabetes     Surgical History:  Past Surgical History:  Procedure Laterality Date  . CESAREAN SECTION  2002  . WISDOM TOOTH EXTRACTION      Medications:  No current outpatient medications on file prior to visit.   No current facility-administered medications on file prior to visit.    Allergies:  No Known Allergies  Social History:  Social History   Socioeconomic History  . Marital status: Married    Spouse name: Not on file  . Number of children: Not on file  . Years of education: Not on file  . Highest education level: Not on file  Occupational History  . Not on file  Tobacco Use  . Smoking status: Never Smoker  . Smokeless tobacco: Never Used  Vaping Use  . Vaping Use: Never used  Substance and Sexual Activity  . Alcohol use: No  . Drug use: No  . Sexual activity: Yes    Birth control/protection: None  Other  Topics Concern  . Not on file  Social History Narrative  . Not on file   Social Determinants of Health   Financial Resource Strain:   . Difficulty of Paying Living Expenses:   Food Insecurity:   . Worried About Programme researcher, broadcasting/film/video in the Last Year:   . Barista in the Last Year:   Transportation Needs:   . Freight forwarder (Medical):   Marland Kitchen Lack of Transportation (Non-Medical):   Physical Activity:   . Days of Exercise per Week:   . Minutes of Exercise per Session:   Stress: Stress Concern Present  . Feeling of Stress : Rather much  Social Connections:   . Frequency of Communication with Friends and Family:   . Frequency of  Social Gatherings with Friends and Family:   . Attends Religious Services:   . Active Member of Clubs or Organizations:   . Attends Banker Meetings:   Marland Kitchen Marital Status:   Intimate Partner Violence:   . Fear of Current or Ex-Partner:   . Emotionally Abused:   Marland Kitchen Physically Abused:   . Sexually Abused:    Social History   Tobacco Use  Smoking Status Never Smoker  Smokeless Tobacco Never Used   Social History   Substance and Sexual Activity  Alcohol Use No    Family History:  Family History  Problem Relation Age of Onset  . Hypertension Mother   . Hyperlipidemia Mother   . Hypothyroidism Mother   . CAD Father   . Heart disease Father   . Diabetes Maternal Grandfather   . COPD Paternal Grandmother   . Cancer Paternal Grandfather        colon    Past medical history, surgical history, medications, allergies, family history and social history reviewed with patient today and changes made to appropriate areas of the chart.   Review of Systems  Constitutional: Negative.   HENT: Negative.   Eyes: Negative.   Respiratory: Negative.   Cardiovascular: Negative.   Gastrointestinal: Positive for blood in stool. Negative for abdominal pain, constipation, diarrhea, heartburn, melena, nausea and vomiting.  Genitourinary: Negative.   Musculoskeletal: Negative.   Skin: Negative.   Neurological: Negative.   Endo/Heme/Allergies: Negative.   Psychiatric/Behavioral: Negative.     All other ROS negative except what is listed above and in the HPI.      Objective:    BP (!) 163/120 (BP Location: Left Arm, Patient Position: Sitting, Cuff Size: Normal)   Pulse 89   Temp 98.8 F (37.1 C) (Oral)   Ht 5' 3.78" (1.62 m)   Wt 240 lb 12.8 oz (109.2 kg)   LMP 02/18/2020 (Approximate)   SpO2 100%   Breastfeeding Yes   BMI 41.62 kg/m   Wt Readings from Last 3 Encounters:  03/05/20 240 lb 12.8 oz (109.2 kg)  01/24/20 247 lb 9.6 oz (112.3 kg)  03/13/18 256 lb 12.8 oz  (116.5 kg)    Physical Exam Vitals and nursing note reviewed. Exam conducted with a chaperone present.  Constitutional:      General: She is not in acute distress.    Appearance: Normal appearance. She is not ill-appearing, toxic-appearing or diaphoretic.  HENT:     Head: Normocephalic and atraumatic.     Right Ear: Tympanic membrane, ear canal and external ear normal. There is no impacted cerumen.     Left Ear: Tympanic membrane, ear canal and external ear normal. There is no impacted cerumen.  Nose: Nose normal. No congestion or rhinorrhea.     Mouth/Throat:     Mouth: Mucous membranes are moist.     Pharynx: Oropharynx is clear. No oropharyngeal exudate or posterior oropharyngeal erythema.  Eyes:     General: No scleral icterus.       Right eye: No discharge.        Left eye: No discharge.     Extraocular Movements: Extraocular movements intact.     Conjunctiva/sclera: Conjunctivae normal.     Pupils: Pupils are equal, round, and reactive to light.  Neck:     Vascular: No carotid bruit.  Cardiovascular:     Rate and Rhythm: Normal rate and regular rhythm.     Pulses: Normal pulses.     Heart sounds: No murmur heard.  No friction rub. No gallop.   Pulmonary:     Effort: Pulmonary effort is normal. No respiratory distress.     Breath sounds: Normal breath sounds. No stridor. No wheezing, rhonchi or rales.  Chest:     Chest wall: No tenderness.     Breasts:        Right: Normal. No swelling, bleeding, inverted nipple, mass, nipple discharge, skin change or tenderness.        Left: Normal. No swelling, bleeding, inverted nipple, mass, nipple discharge, skin change or tenderness.  Abdominal:     General: Abdomen is flat. Bowel sounds are normal. There is no distension.     Palpations: Abdomen is soft. There is no mass.     Tenderness: There is no abdominal tenderness. There is no right CVA tenderness, left CVA tenderness, guarding or rebound.     Hernia: No hernia is  present.  Genitourinary:    Pubic Area: No rash or pubic lice.      Labia:        Right: No rash, tenderness, lesion or injury.        Left: No rash, tenderness, lesion or injury.      Urethra: No prolapse, urethral pain, urethral swelling or urethral lesion.     Vagina: Normal.     Cervix: Normal.     Uterus: Normal.      Adnexa: Right adnexa normal and left adnexa normal.  Musculoskeletal:        General: No swelling, tenderness, deformity or signs of injury.     Cervical back: Normal range of motion and neck supple. No rigidity. No muscular tenderness.     Right lower leg: No edema.     Left lower leg: No edema.  Lymphadenopathy:     Cervical: No cervical adenopathy.  Skin:    General: Skin is warm and dry.     Capillary Refill: Capillary refill takes less than 2 seconds.     Coloration: Skin is not jaundiced or pale.     Findings: No bruising, erythema, lesion or rash.  Neurological:     General: No focal deficit present.     Mental Status: She is alert and oriented to person, place, and time. Mental status is at baseline.     Cranial Nerves: No cranial nerve deficit.     Sensory: No sensory deficit.     Motor: No weakness.     Coordination: Coordination normal.     Gait: Gait normal.     Deep Tendon Reflexes: Reflexes normal.  Psychiatric:        Mood and Affect: Mood normal.        Behavior: Behavior normal.  Thought Content: Thought content normal.        Judgment: Judgment normal.     Results for orders placed or performed in visit on 03/05/20  Basic metabolic panel  Result Value Ref Range   Glucose 169 (H) 65 - 99 mg/dL   BUN 8 6 - 24 mg/dL   Creatinine, Ser 9.51 0.57 - 1.00 mg/dL   GFR calc non Af Amer 115 >59 mL/min/1.73   GFR calc Af Amer 132 >59 mL/min/1.73   BUN/Creatinine Ratio 14 9 - 23   Sodium 140 134 - 144 mmol/L   Potassium 4.1 3.5 - 5.2 mmol/L   Chloride 103 96 - 106 mmol/L   CO2 22 20 - 29 mmol/L   Calcium 9.0 8.7 - 10.2 mg/dL    Urinalysis, Routine w reflex microscopic  Result Value Ref Range   Specific Gravity, UA 1.025 1.005 - 1.030   pH, UA 6.0 5.0 - 7.5   Color, UA Yellow Yellow   Appearance Ur Clear Clear   Leukocytes,UA Negative Negative   Protein,UA Negative Negative/Trace   Glucose, UA Negative Negative   Ketones, UA Negative Negative   RBC, UA Negative Negative   Bilirubin, UA Negative Negative   Urobilinogen, Ur 0.2 0.2 - 1.0 mg/dL   Nitrite, UA Negative Negative      Assessment & Plan:   Problem List Items Addressed This Visit      Cardiovascular and Mediastinum   Essential hypertension    Still running high. Will start amlodipine and recheck 1-2 months. Continue to monitor. Call with any concerns.       Relevant Medications   amLODipine (NORVASC) 5 MG tablet     Endocrine   Uncontrolled type 2 diabetes mellitus with hyperglycemia (HCC)    Sugars doing a bit better, but still running high. Will increase metformin to 1000mg  BID and recheck 2 months. Call with any concerns.       Relevant Medications   metFORMIN (GLUCOPHAGE-XR) 500 MG 24 hr tablet   Other Relevant Orders   Basic metabolic panel (Completed)   Urinalysis, Routine w reflex microscopic (Completed)    Other Visit Diagnoses    Routine general medical examination at a health care facility    -  Primary   Vaccines up to date/declined. Screening labs checked today. Pap done. Hold on mammogram until weaned. Continue diet and exercise. Call with any concerns.    Rectal polyp       Would like to see GI. Referral generated today.   Relevant Orders   Ambulatory referral to Gastroenterology   Screening for cervical cancer       Pap done today. Await results.    Relevant Orders   Cytology - PAP       Follow up plan: Return 1-2 months.   LABORATORY TESTING:  - Pap smear: pap done  IMMUNIZATIONS:   - Tdap: Tetanus vaccination status reviewed: last tetanus booster within 10 years. - Influenza: Postponed to flu season -  Pneumovax: Up to date - COVID: Declined  SCREENING: -Mammogram: Holding off until fully weaned  - Colonoscopy: Not applicable   PATIENT COUNSELING:   Advised to take 1 mg of folate supplement per day if capable of pregnancy.   Sexuality: Discussed sexually transmitted diseases, partner selection, use of condoms, avoidance of unintended pregnancy  and contraceptive alternatives.   Advised to avoid cigarette smoking.  I discussed with the patient that most people either abstain from alcohol or drink within safe limits (<=14/week and <=  4 drinks/occasion for males, <=7/weeks and <= 3 drinks/occasion for females) and that the risk for alcohol disorders and other health effects rises proportionally with the number of drinks per week and how often a drinker exceeds daily limits.  Discussed cessation/primary prevention of drug use and availability of treatment for abuse.   Diet: Encouraged to adjust caloric intake to maintain  or achieve ideal body weight, to reduce intake of dietary saturated fat and total fat, to limit sodium intake by avoiding high sodium foods and not adding table salt, and to maintain adequate dietary potassium and calcium preferably from fresh fruits, vegetables, and low-fat dairy products.    stressed the importance of regular exercise  Injury prevention: Discussed safety belts, safety helmets, smoke detector, smoking near bedding or upholstery.   Dental health: Discussed importance of regular tooth brushing, flossing, and dental visits.    NEXT PREVENTATIVE PHYSICAL DUE IN 1 YEAR. Return 1-2 months.

## 2020-03-05 NOTE — Patient Instructions (Signed)
Health Maintenance, Female Adopting a healthy lifestyle and getting preventive care are important in promoting health and wellness. Ask your health care provider about:  The right schedule for you to have regular tests and exams.  Things you can do on your own to prevent diseases and keep yourself healthy. What should I know about diet, weight, and exercise? Eat a healthy diet   Eat a diet that includes plenty of vegetables, fruits, low-fat dairy products, and lean protein.  Do not eat a lot of foods that are high in solid fats, added sugars, or sodium. Maintain a healthy weight Body mass index (BMI) is used to identify weight problems. It estimates body fat based on height and weight. Your health care provider can help determine your BMI and help you achieve or maintain a healthy weight. Get regular exercise Get regular exercise. This is one of the most important things you can do for your health. Most adults should:  Exercise for at least 150 minutes each week. The exercise should increase your heart rate and make you sweat (moderate-intensity exercise).  Do strengthening exercises at least twice a week. This is in addition to the moderate-intensity exercise.  Spend less time sitting. Even light physical activity can be beneficial. Watch cholesterol and blood lipids Have your blood tested for lipids and cholesterol at 41 years of age, then have this test every 5 years. Have your cholesterol levels checked more often if:  Your lipid or cholesterol levels are high.  You are older than 40 years of age.  You are at high risk for heart disease. What should I know about cancer screening? Depending on your health history and family history, you may need to have cancer screening at various ages. This may include screening for:  Breast cancer.  Cervical cancer.  Colorectal cancer.  Skin cancer.  Lung cancer. What should I know about heart disease, diabetes, and high blood  pressure? Blood pressure and heart disease  High blood pressure causes heart disease and increases the risk of stroke. This is more likely to develop in people who have high blood pressure readings, are of African descent, or are overweight.  Have your blood pressure checked: ? Every 3-5 years if you are 18-39 years of age. ? Every year if you are 40 years old or older. Diabetes Have regular diabetes screenings. This checks your fasting blood sugar level. Have the screening done:  Once every three years after age 40 if you are at a normal weight and have a low risk for diabetes.  More often and at a younger age if you are overweight or have a high risk for diabetes. What should I know about preventing infection? Hepatitis B If you have a higher risk for hepatitis B, you should be screened for this virus. Talk with your health care provider to find out if you are at risk for hepatitis B infection. Hepatitis C Testing is recommended for:  Everyone born from 1945 through 1965.  Anyone with known risk factors for hepatitis C. Sexually transmitted infections (STIs)  Get screened for STIs, including gonorrhea and chlamydia, if: ? You are sexually active and are younger than 41 years of age. ? You are older than 41 years of age and your health care provider tells you that you are at risk for this type of infection. ? Your sexual activity has changed since you were last screened, and you are at increased risk for chlamydia or gonorrhea. Ask your health care provider if   you are at risk.  Ask your health care provider about whether you are at high risk for HIV. Your health care provider may recommend a prescription medicine to help prevent HIV infection. If you choose to take medicine to prevent HIV, you should first get tested for HIV. You should then be tested every 3 months for as long as you are taking the medicine. Pregnancy  If you are about to stop having your period (premenopausal) and  you may become pregnant, seek counseling before you get pregnant.  Take 400 to 800 micrograms (mcg) of folic acid every day if you become pregnant.  Ask for birth control (contraception) if you want to prevent pregnancy. Osteoporosis and menopause Osteoporosis is a disease in which the bones lose minerals and strength with aging. This can result in bone fractures. If you are 65 years old or older, or if you are at risk for osteoporosis and fractures, ask your health care provider if you should:  Be screened for bone loss.  Take a calcium or vitamin D supplement to lower your risk of fractures.  Be given hormone replacement therapy (HRT) to treat symptoms of menopause. Follow these instructions at home: Lifestyle  Do not use any products that contain nicotine or tobacco, such as cigarettes, e-cigarettes, and chewing tobacco. If you need help quitting, ask your health care provider.  Do not use street drugs.  Do not share needles.  Ask your health care provider for help if you need support or information about quitting drugs. Alcohol use  Do not drink alcohol if: ? Your health care provider tells you not to drink. ? You are pregnant, may be pregnant, or are planning to become pregnant.  If you drink alcohol: ? Limit how much you use to 0-1 drink a day. ? Limit intake if you are breastfeeding.  Be aware of how much alcohol is in your drink. In the [place] one drink equals one 12 oz bottle of beer (355 mL), one 5 oz glass of wine (148 mL), or one 1 oz glass of hard liquor (44 mL). General instructions  Schedule regular health, dental, and eye exams.  Stay current with your vaccines.  Tell your health care provider if: ? You often feel depressed. ? You have ever been abused or do not feel safe at home. Summary  Adopting a healthy lifestyle and getting preventive care are important in promoting health and wellness.  Follow your health care provider's instructions about healthy  diet, exercising, and getting tested or screened for diseases.  Follow your health care provider's instructions on monitoring your cholesterol and blood pressure. This information is not intended to replace advice given to you by your health care provider. Make sure you discuss any questions you have with your health care provider. Document Revised: 07/26/2018 Document Reviewed: 07/26/2018 Elsevier Patient Education  2020 [employer]  

## 2020-03-06 ENCOUNTER — Encounter: Payer: Self-pay | Admitting: Family Medicine

## 2020-03-06 LAB — CYTOLOGY - PAP
Adequacy: ABSENT
Comment: NEGATIVE
Diagnosis: NEGATIVE
High risk HPV: NEGATIVE

## 2020-03-06 LAB — BASIC METABOLIC PANEL
BUN/Creatinine Ratio: 14 (ref 9–23)
BUN: 8 mg/dL (ref 6–24)
CO2: 22 mmol/L (ref 20–29)
Calcium: 9 mg/dL (ref 8.7–10.2)
Chloride: 103 mmol/L (ref 96–106)
Creatinine, Ser: 0.58 mg/dL (ref 0.57–1.00)
GFR calc Af Amer: 132 mL/min/{1.73_m2} (ref >59)
GFR calc non Af Amer: 115 mL/min/{1.73_m2} (ref >59)
Glucose: 169 mg/dL — ABNORMAL HIGH (ref 65–99)
Potassium: 4.1 mmol/L (ref 3.5–5.2)
Sodium: 140 mmol/L (ref 134–144)

## 2020-03-06 NOTE — Assessment & Plan Note (Signed)
Still running high. Will start amlodipine and recheck 1-2 months. Continue to monitor. Call with any concerns.

## 2020-03-06 NOTE — Assessment & Plan Note (Signed)
Sugars doing a bit better, but still running high. Will increase metformin to 1000mg  BID and recheck 2 months. Call with any concerns.

## 2020-04-17 ENCOUNTER — Encounter: Payer: Self-pay | Admitting: Family Medicine

## 2020-04-17 ENCOUNTER — Other Ambulatory Visit: Payer: Self-pay

## 2020-04-17 ENCOUNTER — Ambulatory Visit (INDEPENDENT_AMBULATORY_CARE_PROVIDER_SITE_OTHER): Payer: 59 | Admitting: Family Medicine

## 2020-04-17 VITALS — BP 127/82 | HR 86 | Temp 98.9°F | Wt 229.0 lb

## 2020-04-17 DIAGNOSIS — I10 Essential (primary) hypertension: Secondary | ICD-10-CM

## 2020-04-17 DIAGNOSIS — E1165 Type 2 diabetes mellitus with hyperglycemia: Secondary | ICD-10-CM | POA: Diagnosis not present

## 2020-04-17 LAB — BAYER DCA HB A1C WAIVED: HB A1C (BAYER DCA - WAIVED): 6.9 % (ref <7.0)

## 2020-04-17 NOTE — Assessment & Plan Note (Signed)
Congratulated patient on 11lb weight loss. Continue diet and exercise. Call with any concerns.  

## 2020-04-17 NOTE — Progress Notes (Signed)
BP 127/82 (BP Location: Left Arm, Patient Position: Sitting, Cuff Size: Normal)   Pulse 86   Temp 98.9 F (37.2 C) (Oral)   Wt 229 lb (103.9 kg)   SpO2 99%   BMI 39.58 kg/m    Subjective:    Patient ID: Bonnie Armstrong, female    DOB: [birth year], 39 y.o.   MRN: [id number]  HPI: Bonnie Armstrong is a 39 y.o. female  Chief Complaint  Patient presents with  . Hypertension  . Diabetes   HYPERTENSION Hypertension status: controlled  Satisfied with current treatment? yes Duration of hypertension: chronic BP monitoring frequency:  not checking BP medication side effects:  no Medication compliance: excellent compliance Previous BP meds: amlodipine Aspirin: no Recurrent headaches: no Visual changes: no Palpitations: no Dyspnea: no Chest pain: no Lower extremity edema: no Dizzy/lightheaded: yes  DIABETES- up to 1000mg  BID Hypoglycemic episodes:no Polydipsia/polyuria: no Visual disturbance: no Chest pain: no Paresthesias: no Glucose Monitoring: yes  Accucheck frequency: Daily  Fasting glucose: 120s-170s Taking Insulin?: no Blood Pressure Monitoring: a few times a week Retinal Examination: Not up to Date Foot Exam: Up to Date Diabetic Education: Completed Pneumovax: Not up to Date Influenza: Not up to Date Aspirin: no   Relevant past medical, surgical, family and social history reviewed and updated as indicated. Interim medical history since our last visit reviewed. Allergies and medications reviewed and updated.  Review of Systems  Constitutional: Negative.   Respiratory: Negative.   Cardiovascular: Negative.   Gastrointestinal: Negative.   Musculoskeletal: Negative.   Neurological: Negative.   Psychiatric/Behavioral: Negative.     Per HPI unless specifically indicated above     Objective:    BP 127/82 (BP Location: Left Arm, Patient Position: Sitting, Cuff Size: Normal)   Pulse 86   Temp 98.9 F (37.2 C) (Oral)   Wt 229 lb (103.9 kg)   SpO2  99%   BMI 39.58 kg/m   Wt Readings from Last 3 Encounters:  04/17/20 229 lb (103.9 kg)  03/05/20 240 lb 12.8 oz (109.2 kg)  01/24/20 247 lb 9.6 oz (112.3 kg)    Physical Exam Vitals and nursing note reviewed.  Constitutional:      General: She is not in acute distress.    Appearance: Normal appearance. She is not ill-appearing, toxic-appearing or diaphoretic.  HENT:     Head: Normocephalic and atraumatic.     Right Ear: External ear normal.     Left Ear: External ear normal.     Nose: Nose normal.     Mouth/Throat:     Mouth: Mucous membranes are moist.     Pharynx: Oropharynx is clear.  Eyes:     General: No scleral icterus.       Right eye: No discharge.        Left eye: No discharge.     Extraocular Movements: Extraocular movements intact.     Conjunctiva/sclera: Conjunctivae normal.     Pupils: Pupils are equal, round, and reactive to light.  Cardiovascular:     Rate and Rhythm: Normal rate and regular rhythm.     Pulses: Normal pulses.     Heart sounds: Normal heart sounds. No murmur heard.  No friction rub. No gallop.   Pulmonary:     Effort: Pulmonary effort is normal. No respiratory distress.     Breath sounds: Normal breath sounds. No stridor. No wheezing, rhonchi or rales.  Chest:     Chest wall: No tenderness.  Musculoskeletal:  General: Normal range of motion.     Cervical back: Normal range of motion and neck supple.  Skin:    General: Skin is warm and dry.     Capillary Refill: Capillary refill takes less than 2 seconds.     Coloration: Skin is not jaundiced or pale.     Findings: No bruising, erythema, lesion or rash.  Neurological:     General: No focal deficit present.     Mental Status: She is alert and oriented to person, place, and time. Mental status is at baseline.  Psychiatric:        Mood and Affect: Mood normal.        Behavior: Behavior normal.        Thought Content: Thought content normal.        Judgment: Judgment normal.      Results for orders placed or performed in visit on 03/05/20  Basic metabolic panel  Result Value Ref Range   Glucose 169 (H) 65 - 99 mg/dL   BUN 8 6 - 24 mg/dL   Creatinine, Ser 5.02 0.57 - 1.00 mg/dL   GFR calc non Af Amer 115 >59 mL/min/1.73   GFR calc Af Amer 132 >59 mL/min/1.73   BUN/Creatinine Ratio 14 9 - 23   Sodium 140 134 - 144 mmol/L   Potassium 4.1 3.5 - 5.2 mmol/L   Chloride 103 96 - 106 mmol/L   CO2 22 20 - 29 mmol/L   Calcium 9.0 8.7 - 10.2 mg/dL  Urinalysis, Routine w reflex microscopic  Result Value Ref Range   Specific Gravity, UA 1.025 1.005 - 1.030   pH, UA 6.0 5.0 - 7.5   Color, UA Yellow Yellow   Appearance Ur Clear Clear   Leukocytes,UA Negative Negative   Protein,UA Negative Negative/Trace   Glucose, UA Negative Negative   Ketones, UA Negative Negative   RBC, UA Negative Negative   Bilirubin, UA Negative Negative   Urobilinogen, Ur 0.2 0.2 - 1.0 mg/dL   Nitrite, UA Negative Negative  Cytology - PAP  Result Value Ref Range   High risk HPV Negative    Adequacy      Satisfactory for evaluation; transformation zone component ABSENT.   Diagnosis      - Negative for intraepithelial lesion or malignancy (NILM)   Comment Normal Reference Range HPV - Negative       Assessment & Plan:   Problem List Items Addressed This Visit      Cardiovascular and Mediastinum   Essential hypertension    Under good control on current regimen. Continue current regimen. Continue to monitor. Call with any concerns. Refills given. BMP checked today.       Relevant Orders   Basic metabolic panel     Endocrine   Uncontrolled type 2 diabetes mellitus with hyperglycemia (HCC) - Primary    Doing much better. Tolerating metformin. A1c doing much better at 6.9 down from 10.2. Continue current regimen. Continue to monitor. Recheck 3 months.      Relevant Orders   Bayer DCA Hb A1c Waived     Other   Morbid obesity (HCC)    Congratulated patient on 11 lb weight  loss. Continue diet and exercise. Call with any concerns.           Follow up plan: Return in about 3 months (around 07/17/2020).

## 2020-04-17 NOTE — Assessment & Plan Note (Addendum)
Doing much better. Tolerating metformin. A1c doing much better at 6.9 down from 10.2. Continue current regimen. Continue to monitor. Recheck 3 months.

## 2020-04-17 NOTE — Assessment & Plan Note (Signed)
Under good control on current regimen. Continue current regimen. Continue to monitor. Call with any concerns. Refills given. BMP checked today. ° °

## 2020-04-18 ENCOUNTER — Telehealth: Payer: Self-pay | Admitting: General Practice

## 2020-04-18 ENCOUNTER — Ambulatory Visit: Payer: Self-pay | Admitting: General Practice

## 2020-04-18 DIAGNOSIS — E1165 Type 2 diabetes mellitus with hyperglycemia: Secondary | ICD-10-CM

## 2020-04-18 LAB — BASIC METABOLIC PANEL
BUN/Creatinine Ratio: 20 (ref 9–23)
BUN: 12 mg/dL (ref 6–24)
CO2: 24 mmol/L (ref 20–29)
Calcium: 9.1 mg/dL (ref 8.7–10.2)
Chloride: 105 mmol/L (ref 96–106)
Creatinine, Ser: 0.59 mg/dL (ref 0.57–1.00)
GFR calc Af Amer: 132 mL/min/{1.73_m2} (ref >59)
GFR calc non Af Amer: 114 mL/min/{1.73_m2} (ref >59)
Glucose: 109 mg/dL — ABNORMAL HIGH (ref 65–99)
Potassium: 4.1 mmol/L (ref 3.5–5.2)
Sodium: 141 mmol/L (ref 134–144)

## 2020-04-18 NOTE — Chronic Care Management (AMB) (Signed)
  Care Management   Follow Up Note   04/18/2020 Name: Bonnie Armstrong MRN: [id number] DOB: [birth year]  Referred by: Valerie Roys, DO Reason for referral : Care Coordination [facility] Initial outreach for Chronic Disease Management and Care Coordination Needs)   Bonnie Armstrong is a 39 y.o. year old female who is a primary care patient of Valerie Roys, DO. The care management team was consulted for assistance with care management and care coordination needs.    Review of patient status, including review of consultants reports, relevant laboratory and other test results, and collaboration with appropriate care team members and the patient's provider was performed as part of comprehensive patient evaluation and provision of chronic care management services.    SDOH (Social Determinants of Health) assessments performed: Yes See Care Plan activities for detailed interventions related to [facility])     Advanced Directives: See Care Plan and Vynca application for related entries.   Goals Addressed              This Visit's Progress   .  RNCM: Pt-"I am working really hard to get of the diabetes medications" (pt-stated)        [name] (see longtitudinal plan of care for additional care plan information)  Objective:  Lab Results  Component Value Date   HGBA1C 6.9 04/17/2020 .   Lab Results  Component Value Date   CREATININE 0.59 04/17/2020   CREATININE 0.58 03/05/2020   CREATININE 0.58 01/24/2020 .   Marland Kitchen No results found for: EGFR  Current Barriers:  Marland Kitchen Knowledge Deficits related to basic Diabetes pathophysiology and self care/management . Knowledge Deficits related to medications used for management of diabetes  Case Manager Clinical Goal(s):  Over the next 120 days, patient will demonstrate improved adherence to prescribed treatment plan for diabetes self care/management as evidenced by:  . daily monitoring and recording of CBG  . adherence to ADA/ carb modified  diet . exercise 4/5 days/week . adherence to prescribed medication regimen  Interventions:  . Provided education to patient about basic DM disease process . Reviewed medications with patient and discussed importance of medication adherence . Discussed plans with patient for ongoing care management follow up and provided patient with direct contact information for care management team . Provided patient with written educational materials related to hypo and hyperglycemia and importance of correct treatment . Advised patient, providing education and rationale, to check cbg daily and record, calling pcp for findings outside established parameters.  Most recent hemoglobin A1C was 6.9 on 04-17-2020- was 10.2 . Review of patient status, including review of consultants reports, relevant laboratory and other test results, and medications completed.  Patient Self Care Activities:  . UNABLE to independently manage DM. Original hemoglobin A1C was 10.2 at MD visit on 04-17-2020 Hemoglobin A1C was 6.9  Initial goal documentation         Telephone follow up appointment with care management team member scheduled for: 06-25-2020 at 0900.  Noreene Larsson RN, MSN, [facility] Mobile: [phone]

## 2020-04-18 NOTE — Patient Instructions (Signed)
Visit Information  Goals Addressed              This Visit's Progress   .  RNCM: Pt-"I am working really hard to get of the diabetes medications" (pt-stated)        [place] (see longtitudinal plan of care for additional care plan information)  Objective:  Lab Results  Component Value Date   HGBA1C 6.9 04/17/2020 .   Lab Results  Component Value Date   CREATININE 0.59 04/17/2020   CREATININE 0.58 03/05/2020   CREATININE 0.58 01/24/2020 .   Marland Kitchen No results found for: EGFR  Current Barriers:  Marland Kitchen Knowledge Deficits related to basic Diabetes pathophysiology and self care/management . Knowledge Deficits related to medications used for management of diabetes  Case Manager Clinical Goal(s):  Over the next 120 days, patient will demonstrate improved adherence to prescribed treatment plan for diabetes self care/management as evidenced by:  . daily monitoring and recording of CBG  . adherence to ADA/ carb modified diet . exercise 4/5 days/week . adherence to prescribed medication regimen  Interventions:  . Provided education to patient about basic DM disease process . Reviewed medications with patient and discussed importance of medication adherence . Discussed plans with patient for ongoing care management follow up and provided patient with direct contact information for care management team . Provided patient with written educational materials related to hypo and hyperglycemia and importance of correct treatment . Advised patient, providing education and rationale, to check cbg daily and record, calling pcp for findings outside established parameters.  Most recent hemoglobin A1C was 6.9 on 04-17-2020- was 10.2 . Review of patient status, including review of consultants reports, relevant laboratory and other test results, and medications completed.  Patient Self Care Activities:  . UNABLE to independently manage DM. Original hemoglobin A1C was 10.2 at MD visit on 04-17-2020  Hemoglobin A1C was 6.9  Initial goal documentation        Patient verbalizes understanding of instructions provided today.   Telephone follow up appointment with care management team member scheduled for: 06-25-2020 at 0900 am  Noreene Larsson RN, MSN, [facility] Mobile: [phone]

## 2020-05-02 ENCOUNTER — Ambulatory Visit: Payer: Self-pay | Admitting: Licensed Clinical Social Worker

## 2020-05-02 NOTE — Chronic Care Management (AMB) (Signed)
Care Management   Follow Up Note   05/02/2020 Name: Bonnie Armstrong MRN: [id number] DOB: [birth year]  Referred by: Dorcas Carrow, DO Reason for referral : Care Coordination   Bonnie Armstrong is a 39 y.o. year old female who is a primary care patient of Dorcas Carrow, DO. The care management team was consulted for assistance with care management and care coordination needs.    Review of patient status, including review of consultants reports, relevant laboratory and other test results, and collaboration with appropriate care team members and the patient's provider was performed as part of comprehensive patient evaluation and provision of chronic care management services.    SDOH (Social Determinants of Health) assessments performed: Yes See Care Plan activities for detailed interventions related to [facility]     Advanced Directives: See Care Plan and Vynca application for related entries.   Goals Addressed    .  SW- "I feel like I am very supported right now." (pt-stated)        CARE PLAN ENTRY (see longitudinal plan of care for additional care plan information)  Current Barriers:  . Limited social support . Social Isolation . Lack of self-care implementation  Clinical Social Work Clinical Goal(s):   Marland Kitchen Over the next 120 days, patient/caregiver will work with SW to address concerns related to care coordination needs, lack of a stable support network and lack of Economist. LCSW will assist patient in gaining additional support in order to maintain health and mental health appropriately  . Over the next 120 days, patient will demonstrate improved adherence to self care as evidenced by implementing healthy self-care into her daily routine such as: attending all medical appointments, deep breathing exercises, taking time for self-reflection, taking medications as prescribed, drinking water and daily exercise to improve mobility and mood.  . Over the  next 120 days, patient will work with SW by telephone or in person to reduce or manage symptoms related to stress and anxiety . Over the next 120 days, patient will demonstrate improved health management independence as evidenced by implementing healthy self-care skills and positive support/resources into her daily routine to help cope with stressors and improve overall health and well-being  . Over the next 120 days, patient or caregiver will verbalize basic understanding of depression/stress process and self health management plan as evidenced by her participation in development of long term plan of care and institution of self health management strategies  Interventions: . Inter-disciplinary care team collaboration (see longitudinal plan of care) . Patient interviewed and appropriate assessments performed . Provided mental health counseling with regard to healthy self-care management  . Provided patient with information about coping skill education to implement into her daily routine to improve her all overall self-care and stress management . Discussed plans with patient for ongoing care management follow up and provided patient with direct contact information for care management team . Advised patient to contact CCM LCSW if any urgent social work needs arise . Assisted patient/caregiver with obtaining information about health plan benefits . Provided education and assistance to client regarding Advanced Directives. . Provided education to patient/caregiver regarding level of care options. . Patient reports that she has been working hard at taking time for herself to exercise, meditate, and meal prep. Positive reinforcement provided. . Patient reports that she is doing better at this time and is able to manage her health appropriately with the current level of support that she has in place  Patient Self Care Activities:  .  Self administers medications as prescribed . Attends all scheduled  provider appointments . Calls pharmacy for medication refills . Performs ADL's independently . Performs IADL's independently . Calls provider office for new concerns or questions . Lacks social connections  Initial goal documentation      The care management team will reach out to the patient again over the next 60-90 days.   Dickie La, BSW, MSW, LCSW [facility] Care Management [place]  [facility] [email or web address] Phone: [phone]

## 2020-05-05 ENCOUNTER — Other Ambulatory Visit: Payer: Self-pay | Admitting: Family Medicine

## 2020-05-21 ENCOUNTER — Ambulatory Visit: Payer: Self-pay | Admitting: Gastroenterology

## 2020-06-20 ENCOUNTER — Telehealth: Payer: Self-pay

## 2020-06-20 ENCOUNTER — Telehealth: Payer: Self-pay | Admitting: Licensed Clinical Social Worker

## 2020-06-20 NOTE — Telephone Encounter (Signed)
Chronic Care Management    Clinical Social Work General Follow Up Note  06/20/2020 Name: Bonnie Armstrong MRN: [id number] DOB: [birth year]  Bonnie Armstrong is a 39 y.o. year old female who is a primary care patient of Dorcas Carrow, DO. The CCM team was consulted for assistance with [employer] .   Review of patient status, including review of consultants reports, relevant laboratory and other test results, and collaboration with appropriate care team members and the patient's provider was performed as part of comprehensive patient evaluation and provision of chronic care management services.    LCSW completed CCM outreach attempt today but was unable to reach patient successfully. A HIPPA compliant voice message was left encouraging patient to return call once available. LCSW will ask Scheduling Care Guide to reschedule CCM SW appointment with patient as well.  Advanced Directives Status: <no information> See Care Plan for related entries.   Outpatient Encounter Medications as of 06/20/2020  Medication Sig  . amLODipine (NORVASC) 5 MG tablet TAKE 1 TABLET BY MOUTH EVERY DAY  . metFORMIN (GLUCOPHAGE-XR) 500 MG 24 hr tablet Take 2 tablets (1,000 mg total) by mouth 2 (two) times daily with a meal.   No facility-administered encounter medications on file as of 06/20/2020.   Follow Up Plan: Scheduling Care Guide will reach out to patient to reschedule appointment.   Dickie La, BSW, MSW, LCSW [facility] Care [facility] [place]  [facility] [email or web address] Phone: [phone]

## 2020-06-25 ENCOUNTER — Ambulatory Visit: Payer: Self-pay | Admitting: General Practice

## 2020-06-25 ENCOUNTER — Telehealth: Payer: Self-pay | Admitting: General Practice

## 2020-06-25 DIAGNOSIS — E1165 Type 2 diabetes mellitus with hyperglycemia: Secondary | ICD-10-CM

## 2020-06-25 DIAGNOSIS — R002 Palpitations: Secondary | ICD-10-CM

## 2020-06-25 DIAGNOSIS — E782 Mixed hyperlipidemia: Secondary | ICD-10-CM

## 2020-06-25 DIAGNOSIS — I10 Essential (primary) hypertension: Secondary | ICD-10-CM

## 2020-06-25 NOTE — Chronic Care Management (AMB) (Signed)
Care Management   Follow Up Note   06/25/2020 Name: Bonnie Armstrong MRN: [id number] DOB: 10/22/1980  Referred by: Johnson, Megan P, DO Reason for referral : Care Coordination (RNCM Follow up for Chronic Disease Management and Care Coordination Needs)   Bonnie Armstrong is a 39 y.o. year old female who is a primary care patient of Johnson, Megan P, DO. The care management team was consulted for assistance with care management and care coordination needs.    Review of patient status, including review of consultants reports, relevant laboratory and other test results, and collaboration with appropriate care team members and the patient's provider was performed as part of comprehensive patient evaluation and provision of chronic care management services.    SDOH (Social Determinants of Health) assessments performed: Yes See Care Plan activities for detailed interventions related to SDOH)     Advanced Directives: See Care Plan and Vynca application for related entries.   Goals Addressed              This Visit's Progress   .  RNCM- Pt:"I have noticed the palpitations the last couple of weeks" (pt-stated)        CARE PLAN ENTRY (see longtitudinal plan of care for additional care plan information)  Current Barriers:  . Chronic Disease Management support, education, and care coordination needs related to HTN, HLD, and Heart Palpitations   Clinical Goal(s) related to HTN, HLD, and Heart palpitations :  Over the next 120 days, patient will:  . Work with the care management team to address educational, disease management, and care coordination needs  . Begin or continue self health monitoring activities as directed today Measure and record blood pressure 2/3 times per week and adhere to a heart healthy/ADA diet . Call provider office for new or worsened signs and symptoms Blood pressure findings outside established parameters, Shortness of breath, and New or worsened symptom related  to heart palpitations or changes noted in chronic conditions . Call care management team with questions or concerns . Verbalize basic understanding of patient centered plan of care established today  Interventions related to HTN, HLD, and Heart Palpitations :  . Evaluation of current treatment plans and patient's adherence to plan as established by provider.  The patient states she saw a cardiologist and was supposed to have a stress test but then her whole family went through COVID and she had to cancel the stress test. Advised the patient to reach out and call the cardiologist to get stress test rescheduled.  . Assessed patient understanding of disease states.  Review of the patients new concern of heart palpitations. The patient had not noticed them and she was feeling better but they have been back at various times over the last 2 weeks. Explored precipitating factors that may contribute to the heart palpitations like increased caffeine, stress, or other things. The patient states she does drink about 3 cups of coffee a day but has not noticed any palpitations from drinking coffee.  . Assessed patient's education and care coordination needs.  Education on factors that may contribute to palpitations. Advised the patient to call the cardiologist and reschedule the stress test that had to be cancelled.  . Provided disease specific education to patient.  Education on how to check heart rate manually using fingers and wrist and to count. The patient states she can feel her heart "flutter" at times.  Most recent was last night when she was assisting her special needs son in the middle   of the night and she noticed it. Education on discussing this with the pcp and cardiologist.  . Collaborated with appropriate clinical care team members regarding patient needs . Evaluation of upcoming appointments: The patient has an appointment with Dr. Wynetta Emery on 07-17-2020 at [place] am  Patient Self Care Activities related  to HTN, HLD, and Heart Palpitations :  . Patient is unable to independently self-manage chronic health conditions  Initial goal documentation     .  RNCM: Pt-"I am working really hard to get of the diabetes medications" (pt-stated)        [place] (see longtitudinal plan of care for additional care plan information)  Objective:  Lab Results  Component Value Date   HGBA1C 6.9 04/17/2020 .   Lab Results  Component Value Date   CREATININE 0.59 04/17/2020   CREATININE 0.58 03/05/2020   CREATININE 0.58 01/24/2020 .   Marland Kitchen No results found for: EGFR  Current Barriers:  Marland Kitchen Knowledge Deficits related to basic Diabetes pathophysiology and self care/management . Knowledge Deficits related to medications used for management of diabetes  Case Manager Clinical Goal(s):  Over the next 120 days, patient will demonstrate improved adherence to prescribed treatment plan for diabetes self care/management as evidenced by:  . daily monitoring and recording of CBG  . adherence to ADA/ carb modified diet . exercise 4/5 days/week . adherence to prescribed medication regimen  Interventions:  . Provided education to patient about basic DM disease process . Reviewed medications with patient and discussed importance of medication adherence.  06-25-2020: The patient is taking her medications and being compliant with the plan of care. . Discussed plans with patient for ongoing care management follow up and provided patient with direct contact information for care management team . Provided patient with written educational materials related to hypo and hyperglycemia and importance of correct treatment.  06-25-2020: The patient denies any episodes of hypo or hyperglycemia. States blood sugars fasting have been in the 120-142 range.  She is still doing the Whole 30 meal plan and has lost additional weight.  . Advised patient, providing education and rationale, to check cbg daily and record, calling pcp for  findings outside established parameters.  Most recent hemoglobin A1C was 6.9 on 04-17-2020- was 10.2.  06-25-2020: the patient will have a recheck of hemoglobin A1C at upcoming visit on 07-17-2020 with Dr. Wynetta Emery.  . Review of patient status, including review of consultants reports, relevant laboratory and other test results, and medications completed.  Patient Self Care Activities:  . UNABLE to independently manage DM. Original hemoglobin A1C was 10.2 at MD visit on 04-17-2020 Hemoglobin A1C was 6.9  Please see past updates related to this goal by clicking on the "Past Updates" button in the selected goal          Telephone follow up appointment with care management team member scheduled for: 08-26-2020 at 0900 am  [place], MSN, [facility] Mobile: [phone]

## 2020-06-25 NOTE — Patient Instructions (Signed)
Visit Information  Goals Addressed              This Visit's Progress   .  RNCM- Pt:"I have noticed the palpitations the last couple of weeks" (pt-stated)        CARE PLAN ENTRY (see longtitudinal plan of care for additional care plan information)  Current Barriers:  . Chronic Disease Management support, education, and care coordination needs related to HTN, HLD, and Heart Palpitations   Clinical Goal(s) related to HTN, HLD, and Heart palpitations :  Over the next 120 days, patient will:  . Work with the care management team to address educational, disease management, and care coordination needs  . Begin or continue self health monitoring activities as directed today Measure and record blood pressure 2/3 times per week and adhere to a heart healthy/ADA diet . Call provider office for new or worsened signs and symptoms Blood pressure findings outside established parameters, Shortness of breath, and New or worsened symptom related to heart palpitations or changes noted in chronic conditions . Call care management team with questions or concerns . Verbalize basic understanding of patient centered plan of care established today  Interventions related to HTN, HLD, and Heart Palpitations :  . Evaluation of current treatment plans and patient's adherence to plan as established by provider.  The patient states she saw a cardiologist and was supposed to have a stress test but then her whole family went through [place] and she had to cancel the stress test. Advised the patient to reach out and call the cardiologist to get stress test rescheduled.  . Assessed patient understanding of disease states.  Review of the patients new concern of heart palpitations. The patient had not noticed them and she was feeling better but they have been back at various times over the last 2 weeks. Explored precipitating factors that may contribute to the heart palpitations like increased caffeine, stress, or other things.  The patient states she does drink about 3 cups of coffee a day but has not noticed any palpitations from drinking coffee.  . Assessed patient's education and care coordination needs.  Education on factors that may contribute to palpitations. Advised the patient to call the cardiologist and reschedule the stress test that had to be cancelled.  . Provided disease specific education to patient.  Education on how to check heart rate manually using fingers and wrist and to count. The patient states she can feel her heart "flutter" at times.  Most recent was last night when she was assisting her special needs son in the middle of the night and she noticed it. Education on discussing this with the pcp and cardiologist.  . Collaborated with appropriate clinical care team members regarding patient needs . Evaluation of upcoming appointments: The patient has an appointment with Dr. Wynetta Emery on 07-17-2020 at [place] am  Patient Self Care Activities related to HTN, HLD, and Heart Palpitations :  . Patient is unable to independently self-manage chronic health conditions  Initial goal documentation     .  RNCM: Pt-"I am working really hard to get of the diabetes medications" (pt-stated)        [place] (see longtitudinal plan of care for additional care plan information)  Objective:  Lab Results  Component Value Date   HGBA1C 6.9 04/17/2020 .   Lab Results  Component Value Date   CREATININE 0.59 04/17/2020   CREATININE 0.58 03/05/2020   CREATININE 0.58 01/24/2020 .   Marland Kitchen No results found for:  EGFR  Current Barriers:  Marland Kitchen Knowledge Deficits related to basic Diabetes pathophysiology and self care/management . Knowledge Deficits related to medications used for management of diabetes  Case Manager Clinical Goal(s):  Over the next 120 days, patient will demonstrate improved adherence to prescribed treatment plan for diabetes self care/management as evidenced by:  . daily monitoring and recording of CBG    . adherence to ADA/ carb modified diet . exercise 4/5 days/week . adherence to prescribed medication regimen  Interventions:  . Provided education to patient about basic DM disease process . Reviewed medications with patient and discussed importance of medication adherence.  06-25-2020: The patient is taking her medications and being compliant with the plan of care. . Discussed plans with patient for ongoing care management follow up and provided patient with direct contact information for care management team . Provided patient with written educational materials related to hypo and hyperglycemia and importance of correct treatment.  06-25-2020: The patient denies any episodes of hypo or hyperglycemia. States blood sugars fasting have been in the 120-142 range.  She is still doing the Whole 30 meal plan and has lost additional weight.  . Advised patient, providing education and rationale, to check cbg daily and record, calling pcp for findings outside established parameters.  Most recent hemoglobin A1C was 6.9 on 04-17-2020- was 10.2.  06-25-2020: the patient will have a recheck of hemoglobin A1C at upcoming visit on 07-17-2020 with Dr. Wynetta Emery.  . Review of patient status, including review of consultants reports, relevant laboratory and other test results, and medications completed.  Patient Self Care Activities:  . UNABLE to independently manage DM. Original hemoglobin A1C was 10.2 at MD visit on 04-17-2020 Hemoglobin A1C was 6.9  Please see past updates related to this goal by clicking on the "Past Updates" button in the selected goal         Patient verbalizes understanding of instructions provided today.   Telephone follow up appointment with care management team member scheduled for: 08-26-2020 at 0900 am  Noreene Larsson RN, MSN, [facility] Mobile: [phone]   Palpitations Palpitations are feelings that  your heartbeat is not normal. Your heartbeat may feel like it is:  Uneven.  Faster than normal.  Fluttering.  Skipping a beat. This is usually not a serious problem. In some cases, you may need tests to rule out any serious problems. Follow these instructions at home: Pay attention to any changes in your condition. Take these actions to help manage your symptoms: Eating and drinking  Avoid: ? Coffee, tea, soft drinks, and energy drinks. ? Chocolate. ? Alcohol. ? Diet pills. Lifestyle   Try to lower your stress. These things can help you relax: ? Yoga. ? Deep breathing and meditation. ? Exercise. ? Using words and images to create positive thoughts (guided imagery). ? Using your mind to control things in your body (biofeedback).  Do not use drugs.  Get plenty of rest and sleep. Keep a regular bed time. General instructions   Take over-the-counter and prescription medicines only as told by your doctor.  Do not use any products that contain nicotine or tobacco, such as cigarettes and e-cigarettes. If you need help quitting, ask your doctor.  Keep all follow-up visits as told by your doctor. This is important. You may need more tests if palpitations do not go away or get worse. Contact a doctor if:  Your symptoms last more than 24 hours.  Your symptoms occur more often. Get help right away if you:  Have chest pain.  Feel short of breath.  Have a very bad headache.  Feel dizzy.  Pass out (faint). Summary  Palpitations are feelings that your heartbeat is uneven or faster than normal. It may feel like your heart is fluttering or skipping a beat.  Avoid food and drinks that may cause palpitations. These include caffeine, chocolate, and alcohol.  Try to lower your stress. Do not smoke or use drugs.  Get help right away if you faint or have chest pain, shortness of breath, a severe headache, or dizziness. This information is not intended to replace advice given  to you by your health care provider. Make sure you discuss any questions you have with your health care provider. Document Revised: 09/14/2017 Document Reviewed: 09/14/2017 Elsevier Patient Education  2020 [employer].

## 2020-07-09 ENCOUNTER — Other Ambulatory Visit: Payer: Self-pay | Admitting: Family Medicine

## 2020-07-17 ENCOUNTER — Other Ambulatory Visit: Payer: Self-pay

## 2020-07-17 ENCOUNTER — Encounter: Payer: Self-pay | Admitting: Family Medicine

## 2020-07-17 ENCOUNTER — Ambulatory Visit (INDEPENDENT_AMBULATORY_CARE_PROVIDER_SITE_OTHER): Payer: 59 | Admitting: Family Medicine

## 2020-07-17 VITALS — BP 107/73 | HR 84 | Temp 98.3°F | Ht 62.99 in | Wt 216.8 lb

## 2020-07-17 DIAGNOSIS — E1165 Type 2 diabetes mellitus with hyperglycemia: Secondary | ICD-10-CM

## 2020-07-17 DIAGNOSIS — E782 Mixed hyperlipidemia: Secondary | ICD-10-CM

## 2020-07-17 DIAGNOSIS — I10 Essential (primary) hypertension: Secondary | ICD-10-CM | POA: Diagnosis not present

## 2020-07-17 LAB — MICROALBUMIN, URINE WAIVED
Creatinine, Urine Waived: 300 mg/dL (ref 10–300)
Microalb, Ur Waived: 30 mg/L — ABNORMAL HIGH (ref 0–19)
Microalb/Creat Ratio: <30 mg/g (ref <30)

## 2020-07-17 LAB — BAYER DCA HB A1C WAIVED: HB A1C (BAYER DCA - WAIVED): 6 % (ref <7.0)

## 2020-07-17 MED ORDER — AMLODIPINE BESYLATE 5 MG PO TABS
5.0000 mg | ORAL_TABLET | Freq: Every day | ORAL | 1 refills | Status: DC
Start: 2020-07-17 — End: 2021-04-21

## 2020-07-17 MED ORDER — METFORMIN HCL ER 500 MG PO TB24
1000.0000 mg | ORAL_TABLET | Freq: Two times a day (BID) | ORAL | 1 refills | Status: DC
Start: 2020-07-17 — End: 2020-10-15

## 2020-07-17 NOTE — Progress Notes (Signed)
BP 107/73   Pulse 84   Temp 98.3 F (36.8 C) (Oral)   Ht 5' 2.99" (1.6 m)   Wt 216 lb 12.8 oz (98.3 kg)   LMP 07/07/2020   SpO2 97%   BMI 38.41 kg/m    Subjective:    Patient ID: Bonnie Armstrong, female    DOB: 1981-07-31, 39 y.o.   MRN: [id number]  HPI: Bonnie Armstrong is a 39 y.o. female  Chief Complaint  Patient presents with  . Diabetes   DIABETES Hypoglycemic episodes:no Polydipsia/polyuria: no Visual disturbance: no Chest pain: no Paresthesias: no Glucose Monitoring: yes  Accucheck frequency: Daily  Fasting glucose: 120s-140s Taking Insulin?: no Blood Pressure Monitoring: not checking Retinal Examination: Not up to Date Foot Exam: Up to Date Diabetic Education: Not Completed Pneumovax: Up to Date Influenza: Declined Aspirin: no  HYPERTENSION / HYPERLIPIDEMIA Satisfied with current treatment? yes Duration of hypertension: chronic BP monitoring frequency: not checking BP medication side effects: no Past BP meds: none Duration of hyperlipidemia: chronic Cholesterol medication side effects: not on anything Cholesterol supplements: none Past cholesterol medications: none Aspirin: no Recent stressors: no Recurrent headaches: no Visual changes: no Palpitations: no Dyspnea: no Chest pain: no Lower extremity edema: no Dizzy/lightheaded: no   Relevant past medical, surgical, family and social history reviewed and updated as indicated. Interim medical history since our last visit reviewed. Allergies and medications reviewed and updated.  Review of Systems  Constitutional: Negative.   Respiratory: Negative.   Cardiovascular: Negative.   Gastrointestinal: Negative.   Musculoskeletal: Negative.   Neurological: Negative.   Psychiatric/Behavioral: Negative.     Per HPI unless specifically indicated above     Objective:    BP 107/73   Pulse 84   Temp 98.3 F (36.8 C) (Oral)   Ht 5' 2.99" (1.6 m)   Wt 216 lb 12.8 oz (98.3 kg)   LMP  07/07/2020   SpO2 97%   BMI 38.41 kg/m   Wt Readings from Last 3 Encounters:  07/17/20 216 lb 12.8 oz (98.3 kg)  06/25/20 216 lb (98 kg)  04/17/20 229 lb (103.9 kg)    Physical Exam Vitals and nursing note reviewed.  Constitutional:      General: She is not in acute distress.    Appearance: Normal appearance. She is not ill-appearing, toxic-appearing or diaphoretic.  HENT:     Head: Normocephalic and atraumatic.     Right Ear: External ear normal.     Left Ear: External ear normal.     Nose: Nose normal.     Mouth/Throat:     Mouth: Mucous membranes are moist.     Pharynx: Oropharynx is clear.  Eyes:     General: No scleral icterus.       Right eye: No discharge.        Left eye: No discharge.     Extraocular Movements: Extraocular movements intact.     Conjunctiva/sclera: Conjunctivae normal.     Pupils: Pupils are equal, round, and reactive to light.  Cardiovascular:     Rate and Rhythm: Normal rate and regular rhythm.     Pulses: Normal pulses.     Heart sounds: Normal heart sounds. No murmur heard.  No friction rub. No gallop.   Pulmonary:     Effort: Pulmonary effort is normal. No respiratory distress.     Breath sounds: Normal breath sounds. No stridor. No wheezing, rhonchi or rales.  Chest:     Chest wall: No tenderness.  Musculoskeletal:  General: Normal range of motion.     Cervical back: Normal range of motion and neck supple.  Skin:    General: Skin is warm and dry.     Capillary Refill: Capillary refill takes less than 2 seconds.     Coloration: Skin is not jaundiced or pale.     Findings: No bruising, erythema, lesion or rash.  Neurological:     General: No focal deficit present.     Mental Status: She is alert and oriented to person, place, and time. Mental status is at baseline.  Psychiatric:        Mood and Affect: Mood normal.        Behavior: Behavior normal.        Thought Content: Thought content normal.        Judgment: Judgment normal.      Results for orders placed or performed in visit on 04/17/20  Bayer DCA Hb A1c Waived  Result Value Ref Range   HB A1C (BAYER DCA - WAIVED) 6.9 <7.0 %  Basic metabolic panel  Result Value Ref Range   Glucose 109 (H) 65 - 99 mg/dL   BUN 12 6 - 24 mg/dL   Creatinine, Ser 7.16 0.57 - 1.00 mg/dL   GFR calc non Af Amer 114 >59 mL/min/1.73   GFR calc Af Amer 132 >59 mL/min/1.73   BUN/Creatinine Ratio 20 9 - 23   Sodium 141 134 - 144 mmol/L   Potassium 4.1 3.5 - 5.2 mmol/L   Chloride 105 96 - 106 mmol/L   CO2 24 20 - 29 mmol/L   Calcium 9.1 8.7 - 10.2 mg/dL      Assessment & Plan:   Problem List Items Addressed This Visit      Cardiovascular and Mediastinum   Essential hypertension    Under good control on current regimen. Continue current regimen. Continue to monitor. Call with any concerns. Refills given. Labs drawn today.        Relevant Medications   amLODipine (NORVASC) 5 MG tablet   Other Relevant Orders   Comprehensive metabolic panel   Microalbumin, Urine Waived     Endocrine   Uncontrolled type 2 diabetes mellitus with hyperglycemia (HCC) - Primary    Doing great with a1c of 6.0. Will continue current regimen for now and recheck 3 months, if still low, consider reducing metformin.       Relevant Medications   metFORMIN (GLUCOPHAGE-XR) 500 MG 24 hr tablet   Other Relevant Orders   Bayer DCA Hb A1c Waived (STAT)   Comprehensive metabolic panel   Microalbumin, Urine Waived     Other   Hyperlipidemia, mixed    Declines statin due to breast feeding. Will check labs today. Await results. Treat as needed.       Relevant Medications   amLODipine (NORVASC) 5 MG tablet   Other Relevant Orders   Comprehensive metabolic panel   Lipid Panel w/o Chol/HDL Ratio       Follow up plan: Return in about 3 months (around 10/15/2020).

## 2020-07-17 NOTE — Assessment & Plan Note (Signed)
Doing great with a1c of 6.0. Will continue current regimen for now and recheck 3 months, if still low, consider reducing metformin.

## 2020-07-17 NOTE — Assessment & Plan Note (Signed)
Under good control on current regimen. Continue current regimen. Continue to monitor. Call with any concerns. Refills given. Labs drawn today.   

## 2020-07-17 NOTE — Assessment & Plan Note (Signed)
Declines statin due to breast feeding. Will check labs today. Await results. Treat as needed.

## 2020-07-18 LAB — COMPREHENSIVE METABOLIC PANEL
ALT: 15 IU/L (ref 0–32)
AST: 12 IU/L (ref 0–40)
Albumin/Globulin Ratio: 1.8 (ref 1.2–2.2)
Albumin: 4.1 g/dL (ref 3.8–4.8)
Alkaline Phosphatase: 70 IU/L (ref 44–121)
BUN/Creatinine Ratio: 15 (ref 9–23)
BUN: 10 mg/dL (ref 6–24)
Bilirubin Total: 0.5 mg/dL (ref 0.0–1.2)
CO2: 22 mmol/L (ref 20–29)
Calcium: 9 mg/dL (ref 8.7–10.2)
Chloride: 104 mmol/L (ref 96–106)
Creatinine, Ser: 0.68 mg/dL (ref 0.57–1.00)
GFR calc Af Amer: 126 mL/min/{1.73_m2} (ref >59)
GFR calc non Af Amer: 109 mL/min/{1.73_m2} (ref >59)
Globulin, Total: 2.3 g/dL (ref 1.5–4.5)
Glucose: 136 mg/dL — ABNORMAL HIGH (ref 65–99)
Potassium: 3.9 mmol/L (ref 3.5–5.2)
Sodium: 140 mmol/L (ref 134–144)
Total Protein: 6.4 g/dL (ref 6.0–8.5)

## 2020-07-18 LAB — LIPID PANEL W/O CHOL/HDL RATIO
Cholesterol, Total: 180 mg/dL (ref 100–199)
HDL: 42 mg/dL (ref >39)
LDL Chol Calc (NIH): 122 mg/dL — ABNORMAL HIGH (ref 0–99)
Triglycerides: 88 mg/dL (ref 0–149)
VLDL Cholesterol Cal: 16 mg/dL (ref 5–40)

## 2020-07-28 ENCOUNTER — Telehealth: Payer: Self-pay

## 2020-07-28 NOTE — Chronic Care Management (AMB) (Signed)
Care Management   Note  07/28/2020 Name: Bonnie Armstrong MRN: [id number] DOB: [birth year]  Bonnie Armstrong is a 39 y.o. year old female who is a primary care patient of Dorcas Carrow, DO and is actively engaged with the care management team. I reached out to Bonnie Armstrong by phone today to assist with re-scheduling a follow up visit with the Licensed Clinical Social Worker  Follow up plan: Unsuccessful telephone outreach attempt made. A HIPAA compliant phone message was left for the patient providing contact information and requesting a return call.  The care management team will reach out to the patient again over the next 7 days.  If patient returns call to provider office, please advise to call Embedded Care Management Care Guide Penne Lash  at [phone]  Penne Lash, RMA Care Guide, Embedded Care Coordination [facility]  [place], [place] [address] Direct Dial: [phone] [email or web address]: [email or web address]

## 2020-08-07 NOTE — Chronic Care Management (AMB) (Signed)
Care Management   Note  08/07/2020 Name: Bonnie Armstrong MRN: [id number] DOB: 1980-12-28  Bonnie Armstrong is a 39 y.o. year old female who is a primary care patient of Dorcas Carrow, DO and is actively engaged with the care management team. I reached out to Bonnie Armstrong by phone today to assist with re-scheduling a follow up visit with the Licensed Clinical Social Worker  Follow up plan: Unsuccessful telephone outreach attempt made. A HIPAA compliant phone message was left for the patient providing contact information and requesting a return call.  The care management team will reach out to the patient again over the next 7 days.  If patient returns call to provider office, please advise to call Embedded Care Management Care Guide Penne Lash  at [phone]  Penne Lash, RMA Care Guide, Embedded Care Coordination [facility]  [place], [place] [address] Direct Dial: [phone] [email or web address]: [email or web address]

## 2020-08-12 NOTE — Chronic Care Management (AMB) (Signed)
Care Management   Note  08/12/2020 Name: ALIJANDRA SPIVA MRN: [id number] DOB: [birth year]  [name] is a 39 y.o. year old female who is a primary care patient of Dorcas Carrow, DO and is actively engaged with the care management team. I reached out to [name] by phone today to assist with re-scheduling a follow up visit with the Licensed Clinical Social Worker  Follow up plan: Telephone appointment with care management team member scheduled for:10/07/2019  Penne Lash, RMA Care Guide, Embedded Care Coordination [facility]  [place], [place] [address] Direct Dial: [phone] [email or web address] Website: [email or web address]

## 2020-08-12 NOTE — Telephone Encounter (Signed)
Patient has been r/s  

## 2020-08-26 ENCOUNTER — Telehealth: Payer: Self-pay | Admitting: General Practice

## 2020-08-26 ENCOUNTER — Ambulatory Visit: Payer: Self-pay | Admitting: General Practice

## 2020-08-26 DIAGNOSIS — E1165 Type 2 diabetes mellitus with hyperglycemia: Secondary | ICD-10-CM

## 2020-08-26 DIAGNOSIS — E782 Mixed hyperlipidemia: Secondary | ICD-10-CM

## 2020-08-26 DIAGNOSIS — I10 Essential (primary) hypertension: Secondary | ICD-10-CM

## 2020-08-26 NOTE — Chronic Care Management (AMB) (Signed)
Care Management    RN Visit Note  08/26/2020 Name: Bonnie Armstrong MRN: [id number] DOB: 1980/09/17  Subjective: Bonnie Armstrong is a 40 y.o. year old female who is a primary care patient of Valerie Roys, DO. The Care Management team was consulted for assistance with chronic disease management and care coordination needs.    Engaged with patient by telephone for follow up visit in response to provider referral for pharmacy case management and/or care coordination services.   Consent to Services:   [name] was given information about Care Management services today including:  1. Care Management services includes personalized support from designated clinical staff supervised by her physician, including individualized plan of care and coordination with other care providers 2. 24/7 contact phone numbers for assistance for urgent and routine care needs. 3. The patient may stop case management services at any time by phone call to the office staff.  Patient agreed to services and consent obtained.   Assessment/Interventions: Review of patient past medical history, allergies, medications, health status, including review of consultants reports, laboratory and other test data, was performed as part of comprehensive evaluation and provision of chronic care management services.   SDOH (Social Determinants of Health) assessments and interventions performed:    Care Plan  No Known Allergies  Outpatient Encounter Medications as of 08/26/2020  Medication Sig   amLODipine (NORVASC) 5 MG tablet Take 1 tablet (5 mg total) by mouth daily.   metFORMIN (GLUCOPHAGE-XR) 500 MG 24 hr tablet Take 2 tablets (1,000 mg total) by mouth 2 (two) times daily with a meal.   No facility-administered encounter medications on file as of 08/26/2020.    Patient Active Problem List   Diagnosis Date Noted   Essential hypertension 01/24/2020   Hyperlipidemia, mixed 02/23/2019   SOBOE (shortness of breath  on exertion) 02/23/2019   Uncontrolled type 2 diabetes mellitus with hyperglycemia ([place]) 03/13/2018   Palpitations 04/15/2016   Morbid obesity ([place]) 02/28/2015    Conditions to be addressed/monitored: HTN, HLD and DMII  Patient Care Plan: RNCM: HLD Plan of care    Problem Identified: RNCM: HLD Management   Priority: Medium    Goal: RNCM: Management of HLD   Priority: Medium  Note:   Current Barriers:   Poorly controlled hyperlipidemia, complicated by HTN, DM2, Obesity  Current antihyperlipidemic regimen: does not take statins due to breast feeding   Most recent lipid panel:     Component Value Date/Time   CHOL 180 07/17/2020 1012   TRIG 88 07/17/2020 1012   HDL 42 07/17/2020 1012   CHOLHDL 4.1 03/13/2018 1151   VLDL 25 04/21/2016 0001   LDLCALC 122 (H) 07/17/2020 1012   LDLCALC 136 (H) 03/13/2018 1151     ASCVD risk enhancing conditions: age >58, DM, HTN, CKD, CHF, current smoker  Unable to independently manage HLD  RN Care Manager Clinical Goal(s):   Over the next 120 days, patient will work with Consulting civil engineer, providers, and care team towards execution of optimized self-health management plan  Over the next 120 days, patient will verbalize understanding of plan for HLD  Over the next 120 days, patient will work with [facility], CCM team, and pcp to address needs related to HLD and education needs   Over the next 120 days, patient will attend all scheduled medical appointments: 10-15-2020 at 0840 am  Interventions:  Medication review performed; medication list updated in electronic medical record.   Inter-disciplinary care team collaboration (see longitudinal plan of  care)  Referred to pharmacy team for assistance with HLD medication management  Evaluation of current treatment plan related to HLD and patient's adherence to plan as established by provider.  Advised patient to call the office for changes in condition or worsening sx/sx of condition  Provided  education to patient re: managing diet and activity  Reviewed medications with patient and discussed compliance- the patient does not take statins due to breast feeding- manages with her diet  Discussed plans with patient for ongoing care management follow up and provided patient with direct contact information for care management team  Reviewed scheduled/upcoming provider appointments including: 10-15-2020 at 0840 am   Patient Goals/Self-Care Activities:  Over the next 120 days, patient will:   - call for medicine refill 2 or 3 days before it runs out - call if I am sick and can't take my medicine - keep a list of all the medicines I take; vitamins and herbals too - learn to read medicine labels - drink 6 to 8 glasses of water each day - eat 3 to 5 servings of fruits and vegetables each day - eat 5 or 6 small meals each day - limit fast food meals to no more than 1 per week - manage portion size - prepare main meal at home 3 to 5 days each week - read food labels for fat, fiber, carbohydrates and portion size - set a realistic goal - set goal weight - be open to making changes - I can manage, know and watch for signs of a heart attack - if I have chest pain, call for help - learn about small changes that will make a big difference - learn my personal risk factors - collaboration with team encouraged - emotional support provided - empathic listening provided - reassurance provided - self-reliance encouraged - verbalization of feelings encouraged   Follow Up Plan: Face to Face appointment with care management team member scheduled for: 10-15-2020 at 0830 am.  The patient has a face to face with the provider on this day also     Task: RNCM: HLD management   Note:   Care Management Activities:    - collaboration with team encouraged - emotional support provided - empathic listening provided - reassurance provided - self-reliance encouraged - verbalization of feelings  encouraged       Patient Care Plan: RNCM: Hypertension (Adult)    Problem Identified: RNCM: Hypertension (Hypertension)   Priority: Medium    Goal: RNCM: Hypertension Monitored   Priority: Medium  Note:   Objective:   Last practice recorded BP readings:  BP Readings from Last 3 Encounters:  07/17/20 107/73  04/17/20 127/82  03/05/20 (!) 163/120     Most recent eGFR/CrCl: No results found for: EGFR  No components found for: CRCL Current Barriers:   Knowledge Deficits related to basic understanding of hypertension pathophysiology and self care management  Knowledge Deficits related to understanding of medications prescribed for management of hypertension  Unable to independently manage HTN Case Manager Clinical Goal(s):   Over the next 120 days, patient will verbalize understanding of plan for hypertension management  Over the next 120 days, patient will attend all scheduled medical appointments: 10-15-2020 at 0840 am  Over the next 120 days, patient will demonstrate improved adherence to prescribed treatment plan for hypertension as evidenced by taking all medications as prescribed, monitoring and recording blood pressure as directed, adhering to low sodium/DASH diet  Over the next 120 days, patient will demonstrate improved health  management independence as evidenced by checking blood pressure as directed and notifying PCP if SBP>160 or DBP > 90, taking all medications as prescribe, and adhering to a low sodium diet as discussed.  Over the next 120 days, patient will verbalize basic understanding of hypertension disease process and self health management plan as evidenced by normalized blood pressure, stabilized weight, and maintaining and maintaining a healthy diet Interventions:   Evaluation of current treatment plan related to hypertension self management and patient's adherence to plan as established by provider.  Provided education to patient re: stroke prevention, s/s  of heart attack and stroke, DASH diet, complications of uncontrolled blood pressure  Reviewed medications with patient and discussed importance of compliance  Discussed plans with patient for ongoing care management follow up and provided patient with direct contact information for care management team  Advised patient, providing education and rationale, to monitor blood pressure daily and record, calling PCP for findings outside established parameters.   Reviewed scheduled/upcoming provider appointments including: 10-15-2020 at [place] am Patient Goals/Self-Care Activities  Over the next 120 days, patient will:  - Calls provider office for new concerns, questions, or BP outside discussed parameters Checks BP and records as discussed Follows a low sodium diet/DASH diet - blood pressure trends reviewed - depression screen reviewed - home or ambulatory blood pressure monitoring encouraged Follow Up Plan: Face to Face appointment with care management team member scheduled for:  10-15-2020 at 0830 am   Task: RNCM: Identify and Monitor Blood Pressure Elevation   Note:   Care Management Activities:    - blood pressure trends reviewed - depression screen reviewed - home or ambulatory blood pressure monitoring encouraged       Patient Care Plan: RNCM: Diabetes Type 2 (Adult)    Problem Identified: RNCM: Glycemic Management (Diabetes, Type 2)   Priority: Medium    Goal: RNCM: DM-Glycemic Management Optimized   Priority: Medium  Note:   Objective:  Lab Results  Component Value Date   HGBA1C 6.0 07/17/2020    Lab Results  Component Value Date   CREATININE 0.68 07/17/2020   CREATININE 0.59 04/17/2020   CREATININE 0.58 03/05/2020     No results found for: EGFR Current Barriers:   Knowledge Deficits related to basic Diabetes pathophysiology and self care/management  Knowledge Deficits related to medications used for management of diabetes  Unable to independently DM Case  Manager Clinical Goal(s):   Over the next 120 days, patient will demonstrate improved adherence to prescribed treatment plan for diabetes self care/management as evidenced by:   daily monitoring and recording of CBG   adherence to ADA/ carb modified diet  exercise 4/5 days/week  adherence to prescribed medication regimen Interventions:   Provided education to patient about basic DM disease process  Reviewed medications with patient and discussed importance of medication adherence.  The patient is hoping at the next provider visit she can taper Metformin and get off of it. She is doing the Whole 30 program and is steadily losing weight. Her goal is to come off of the Metformin completely.   Discussed plans with patient for ongoing care management follow up and provided patient with direct contact information for care management team  Provided patient with written educational materials related to hypo and hyperglycemia and importance of correct treatment  Reviewed scheduled/upcoming provider appointments including: 10-15-2020 at [place] am  Advised patient, providing education and rationale, to check cbg BID and record, calling pcp for findings outside established parameters.    Review  of patient status, including review of consultants reports, relevant laboratory and other test results, and medications completed. Patient Goals/Self-Care Activities  Over the next 120 days, patient will:  - UNABLE to independently manage DM Adheres to prescribed ADA/carb modified - barriers to adherence to treatment plan identified - blood glucose monitoring encouraged - blood glucose readings reviewed - resources required to improve adherence to care identified - self-awareness of signs/symptoms of hypo or hyperglycemia encouraged - use of blood glucose monitoring log promoted Follow Up Plan: Face to Face appointment with care management team member scheduled for:  10-15-2020 at 0830 am   Task: RNCM:  Alleviate Barriers to Glycemic Management   Note:   Care Management Activities:    - barriers to adherence to treatment plan identified - blood glucose monitoring encouraged - blood glucose readings reviewed - resources required to improve adherence to care identified - self-awareness of signs/symptoms of hypo or hyperglycemia encouraged - use of blood glucose monitoring log promoted         Plan: Face to Face appointment with care management team member scheduled for: 10-15-2020 at 0830 am  [place], MSN, [facility] Mobile: [phone]

## 2020-08-26 NOTE — Patient Instructions (Signed)
Visit Information  Patient Care Plan: RNCM: HLD Plan of care    Problem Identified: RNCM: HLD Management   Priority: Medium    Goal: RNCM: Management of HLD   Priority: Medium  Note:   Current Barriers:  . Poorly controlled hyperlipidemia, complicated by HTN, DM2, Obesity . Current antihyperlipidemic regimen: does not take statins due to breast feeding  . Most recent lipid panel:     Component Value Date/Time   CHOL 180 07/17/2020 1012   TRIG 88 07/17/2020 1012   HDL 42 07/17/2020 1012   CHOLHDL 4.1 03/13/2018 1151   VLDL 25 04/21/2016 0001   LDLCALC 122 (H) 07/17/2020 1012   LDLCALC 136 (H) 03/13/2018 1151 .   . ASCVD risk enhancing conditions: age >65, DM, HTN, CKD, CHF, current smoker . Unable to independently manage HLD  RN Care Manager Clinical Goal(s):  . Over the next 120 days, patient will work with RN Care Manager, providers, and care team towards execution of optimized self-health management plan . Over the next 120 days, patient will verbalize understanding of plan for HLD . Over the next 120 days, patient will work with RNCM, CCM team, and pcp to address needs related to HLD and education needs  . Over the next 120 days, patient will attend all scheduled medical appointments: 10-15-2020 at 0840 am  Interventions: . Medication review performed; medication list updated in electronic medical record.  . Inter-disciplinary care team collaboration (see longitudinal plan of care) . Referred to pharmacy team for assistance with HLD medication management . Evaluation of current treatment plan related to HLD and patient's adherence to plan as established by provider. . Advised patient to call the office for changes in condition or worsening sx/sx of condition . Provided education to patient re: managing diet and activity . Reviewed medications with patient and discussed compliance- the patient does not take statins due to breast feeding- manages with her diet . Discussed  plans with patient for ongoing care management follow up and provided patient with direct contact information for care management team . Reviewed scheduled/upcoming provider appointments including: 10-15-2020 at 0840 am   Patient Goals/Self-Care Activities: . Over the next 120 days, patient will:   - call for medicine refill 2 or 3 days before it runs out - call if I am sick and can't take my medicine - keep a list of all the medicines I take; vitamins and herbals too - learn to read medicine labels - drink 6 to 8 glasses of water each day - eat 3 to 5 servings of fruits and vegetables each day - eat 5 or 6 small meals each day - limit fast food meals to no more than 1 per week - manage portion size - prepare main meal at home 3 to 5 days each week - read food labels for fat, fiber, carbohydrates and portion size - set a realistic goal - set goal weight - be open to making changes - I can manage, know and watch for signs of a heart attack - if I have chest pain, call for help - learn about small changes that will make a big difference - learn my personal risk factors - collaboration with team encouraged - emotional support provided - empathic listening provided - reassurance provided - self-reliance encouraged - verbalization of feelings encouraged   Follow Up Plan: Face to Face appointment with care management team member scheduled for: 10-15-2020 at 0830 am.  The patient has a face to face with the   provider on this day also     Task: RNCM: HLD management   Note:   Care Management Activities:    - collaboration with team encouraged - emotional support provided - empathic listening provided - reassurance provided - self-reliance encouraged - verbalization of feelings encouraged       Patient Care Plan: RNCM: Hypertension (Adult)    Problem Identified: RNCM: Hypertension (Hypertension)   Priority: Medium    Goal: RNCM: Hypertension Monitored   Priority: Medium  Note:    Objective:  . Last practice recorded BP readings:  BP Readings from Last 3 Encounters:  07/17/20 107/73  04/17/20 127/82  03/05/20 (!) 163/120 .   . Most recent eGFR/CrCl: No results found for: EGFR  No components found for: CRCL Current Barriers:  . Knowledge Deficits related to basic understanding of hypertension pathophysiology and self care management . Knowledge Deficits related to understanding of medications prescribed for management of hypertension . Unable to independently manage HTN Case Manager Clinical Goal(s):  . Over the next 120 days, patient will verbalize understanding of plan for hypertension management . Over the next 120 days, patient will attend all scheduled medical appointments: 10-15-2020 at 0840 am . Over the next 120 days, patient will demonstrate improved adherence to prescribed treatment plan for hypertension as evidenced by taking all medications as prescribed, monitoring and recording blood pressure as directed, adhering to low sodium/DASH diet . Over the next 120 days, patient will demonstrate improved health management independence as evidenced by checking blood pressure as directed and notifying PCP if SBP>160 or DBP > 90, taking all medications as prescribe, and adhering to a low sodium diet as discussed. . Over the next 120 days, patient will verbalize basic understanding of hypertension disease process and self health management plan as evidenced by normalized blood pressure, stabilized weight, and maintaining and maintaining a healthy diet Interventions:  . Evaluation of current treatment plan related to hypertension self management and patient's adherence to plan as established by provider. . Provided education to patient re: stroke prevention, s/s of heart attack and stroke, DASH diet, complications of uncontrolled blood pressure . Reviewed medications with patient and discussed importance of compliance . Discussed plans with patient for ongoing care  management follow up and provided patient with direct contact information for care management team . Advised patient, providing education and rationale, to monitor blood pressure daily and record, calling PCP for findings outside established parameters.  . Reviewed scheduled/upcoming provider appointments including: 10-15-2020 at 0840 am Patient Goals/Self-Care Activities . Over the next 120 days, patient will:  - Calls provider office for new concerns, questions, or BP outside discussed parameters Checks BP and records as discussed Follows a low sodium diet/DASH diet - blood pressure trends reviewed - depression screen reviewed - home or ambulatory blood pressure monitoring encouraged Follow Up Plan: Face to Face appointment with care management team member scheduled for:  10-15-2020 at 0830 am   Task: RNCM: Identify and Monitor Blood Pressure Elevation   Note:   Care Management Activities:    - blood pressure trends reviewed - depression screen reviewed - home or ambulatory blood pressure monitoring encouraged       Patient Care Plan: RNCM: Diabetes Type 2 (Adult)    Problem Identified: RNCM: Glycemic Management (Diabetes, Type 2)   Priority: Medium    Goal: RNCM: DM-Glycemic Management Optimized   Priority: Medium  Note:   Objective:  Lab Results  Component Value Date   HGBA1C 6.0 07/17/2020 .     Lab Results  Component Value Date   CREATININE 0.68 07/17/2020   CREATININE 0.59 04/17/2020   CREATININE 0.58 03/05/2020 .   Marland Kitchen No results found for: EGFR Current Barriers:  Marland Kitchen Knowledge Deficits related to basic Diabetes pathophysiology and self care/management . Knowledge Deficits related to medications used for management of diabetes . Unable to independently DM Case Manager Clinical Goal(s):  Marland Kitchen Over the next 120 days, patient will demonstrate improved adherence to prescribed treatment plan for diabetes self care/management as evidenced by:  . daily monitoring and recording of  CBG  . adherence to ADA/ carb modified diet . exercise 4/5 days/week . adherence to prescribed medication regimen Interventions:  . Provided education to patient about basic DM disease process . Reviewed medications with patient and discussed importance of medication adherence.  The patient is hoping at the next provider visit she can taper Metformin and get off of it. She is doing the Whole 30 program and is steadily losing weight. Her goal is to come off of the Metformin completely.  . Discussed plans with patient for ongoing care management follow up and provided patient with direct contact information for care management team . Provided patient with written educational materials related to hypo and hyperglycemia and importance of correct treatment . Reviewed scheduled/upcoming provider appointments including: 10-15-2020 at [place] am . Advised patient, providing education and rationale, to check cbg BID and record, calling pcp for findings outside established parameters.   . Review of patient status, including review of consultants reports, relevant laboratory and other test results, and medications completed. Patient Goals/Self-Care Activities . Over the next 120 days, patient will:  - UNABLE to independently manage DM Adheres to prescribed ADA/carb modified - barriers to adherence to treatment plan identified - blood glucose monitoring encouraged - blood glucose readings reviewed - resources required to improve adherence to care identified - self-awareness of signs/symptoms of hypo or hyperglycemia encouraged - use of blood glucose monitoring log promoted Follow Up Plan: Face to Face appointment with care management team member scheduled for:  10-15-2020 at 0830 am   Task: RNCM: Alleviate Barriers to Glycemic Management   Note:   Care Management Activities:    - barriers to adherence to treatment plan identified - blood glucose monitoring encouraged - blood glucose readings reviewed -  resources required to improve adherence to care identified - self-awareness of signs/symptoms of hypo or hyperglycemia encouraged - use of blood glucose monitoring log promoted         The patient verbalized understanding of instructions, educational materials, and care plan provided today and declined offer to receive copy of patient instructions, educational materials, and care plan.   Face to Face appointment with care management team member scheduled for:  10-15-2020 at 0830 am  Noreene Larsson RN, MSN, [facility] Mobile: [phone]

## 2020-10-06 ENCOUNTER — Telehealth: Payer: 59

## 2020-10-06 ENCOUNTER — Telehealth: Payer: Self-pay | Admitting: Licensed Clinical Social Worker

## 2020-10-06 NOTE — Telephone Encounter (Signed)
Chronic Care Armstrong    Clinical Social Work General Follow Up Note  10/06/2020 Name: Bonnie Armstrong MRN: [id number] DOB: 19-Jan-1981  Bonnie Armstrong is a 40 y.o. year old female who is a primary care patient of Bonnie Carrow, DO. Bonnie CCM team was consulted for assistance with Bonnie Armstrong .   Review of patient status, including review of consultants reports, relevant laboratory and other test results, and collaboration with appropriate care team members and Bonnie patient's provider was performed as part of comprehensive patient evaluation and provision of chronic care Armstrong services.    Bonnie Armstrong completed second CCM outreach attempt today but was unable to reach patient successfully. A HIPPA compliant voice message was unable to be left as mailboc was full. Bonnie Armstrong will ask Scheduling Care Guide to reschedule CCM SW appointment with patient as well.  Outpatient Encounter Medications as of 10/06/2020  Medication Sig  . amLODipine (NORVASC) 5 MG tablet Take 1 tablet (5 mg total) by mouth daily.  . metFORMIN (GLUCOPHAGE-XR) 500 MG 24 hr tablet Take 2 tablets (1,000 mg total) by mouth 2 (two) times daily with a meal.   No facility-administered encounter medications on file as of 10/06/2020.    Follow Up Plan: Scheduling Care Guide will reach out to patient to reschedule appointment.   Bonnie Armstrong, Bonnie Armstrong, Bonnie Armstrong, Bonnie Armstrong Bonnie Armstrong Bonnie Armstrong/Bonnie Armstrong [place]  Bonnie Armstrong [email or web address] Phone: [phone]

## 2020-10-15 ENCOUNTER — Other Ambulatory Visit: Payer: Self-pay

## 2020-10-15 ENCOUNTER — Ambulatory Visit: Payer: Self-pay | Admitting: General Practice

## 2020-10-15 ENCOUNTER — Encounter: Payer: Self-pay | Admitting: Family Medicine

## 2020-10-15 ENCOUNTER — Ambulatory Visit (INDEPENDENT_AMBULATORY_CARE_PROVIDER_SITE_OTHER): Payer: 59 | Admitting: Family Medicine

## 2020-10-15 VITALS — BP 124/83 | HR 87 | Temp 98.6°F | Wt 215.6 lb

## 2020-10-15 DIAGNOSIS — E1165 Type 2 diabetes mellitus with hyperglycemia: Secondary | ICD-10-CM

## 2020-10-15 DIAGNOSIS — I10 Essential (primary) hypertension: Secondary | ICD-10-CM

## 2020-10-15 DIAGNOSIS — E782 Mixed hyperlipidemia: Secondary | ICD-10-CM

## 2020-10-15 LAB — BAYER DCA HB A1C WAIVED: HB A1C (BAYER DCA - WAIVED): 5.8 % (ref <7.0)

## 2020-10-15 MED ORDER — METFORMIN HCL ER 500 MG PO TB24
500.0000 mg | ORAL_TABLET | Freq: Two times a day (BID) | ORAL | 1 refills | Status: DC
Start: 2020-10-15 — End: 2021-04-21

## 2020-10-15 NOTE — Assessment & Plan Note (Signed)
Doing great with A1c of 5.8. Will cut metfoming to 500mg  BID and recheck 3 months. Call with any concerns.

## 2020-10-15 NOTE — Chronic Care Management (AMB) (Signed)
Care Management    RN Visit Note  10/15/2020 Name: Bonnie Armstrong MRN: [id number] DOB: Apr 19, 1981  Subjective: Bonnie Armstrong is a 40 y.o. year old female who is a primary care patient of Valerie Roys, DO. The care management team was consulted for assistance with disease management and care coordination needs.    Engaged with patient face to face for follow up visit in response to provider referral for case management and/or care coordination services.   Consent to Services:   Bonnie Armstrong was given information about Care Management services today including:  1. Care Management services includes personalized support from designated clinical staff supervised by her physician, including individualized plan of care and coordination with other care providers 2. 24/7 contact phone numbers for assistance for urgent and routine care needs. 3. The patient may stop case management services at any time by phone call to the office staff.  Patient agreed to services and consent obtained.   Assessment: Review of patient past medical history, allergies, medications, health status, including review of consultants reports, laboratory and other test data, was performed as part of comprehensive evaluation and provision of chronic care management services.   SDOH (Social Determinants of Health) assessments and interventions performed:    Care Plan  No Known Allergies  Outpatient Encounter Medications as of 10/15/2020  Medication Sig  . amLODipine (NORVASC) 5 MG tablet Take 1 tablet (5 mg total) by mouth daily.  . metFORMIN (GLUCOPHAGE-XR) 500 MG 24 hr tablet Take 1 tablet (500 mg total) by mouth in the morning and at bedtime.   No facility-administered encounter medications on file as of 10/15/2020.    Patient Active Problem List   Diagnosis Date Noted  . Essential hypertension 01/24/2020  . Hyperlipidemia, mixed 02/23/2019  . SOBOE (shortness of breath on exertion) 02/23/2019  .  Uncontrolled type 2 diabetes mellitus with hyperglycemia ([place]) 03/13/2018  . Palpitations 04/15/2016  . Morbid obesity ([place]) 02/28/2015    Conditions to be addressed/monitored: HTN, HLD and DMII  Care Plan : RNCM: HLD Plan of care  Updates made by Bonnie Armstrong since 10/15/2020 12:00 AM    Problem: RNCM: HLD Management   Priority: Medium    Goal: RNCM: Management of HLD   Priority: Medium  Note:   Current Barriers:  . Poorly controlled hyperlipidemia, complicated by HTN, DM2, Obesity . Current antihyperlipidemic regimen: does not take statins due to breast feeding  . Most recent lipid panel:     Component Value Date/Time   CHOL 180 07/17/2020 1012   TRIG 88 07/17/2020 1012   HDL 42 07/17/2020 1012   CHOLHDL 4.1 03/13/2018 1151   VLDL 25 04/21/2016 0001   LDLCALC 122 (H) 07/17/2020 1012   LDLCALC 136 (H) 03/13/2018 1151 .   Marland Kitchen ASCVD risk enhancing conditions: age 20, DM, HTN . Unable to independently manage HLD  RN Care Manager Clinical Goal(s):  Marland Kitchen Over the next 120 days, patient will work with Consulting civil engineer, providers, and care team towards execution of optimized self-health management plan . Over the next 120 days, patient will verbalize understanding of plan for HLD . Over the next 120 days, patient will work with [facility], [facility] team, and pcp to address needs related to HLD and education needs  . Over the next 120 days, patient will attend all scheduled medical appointments: 10-15-2020 at 0840 am  Interventions: Marland Kitchen Medication review performed; medication list updated in electronic medical record.  Bertram Savin care team collaboration (see  longitudinal plan of care) . Referred to pharmacy team for assistance with HLD medication management . Evaluation of current treatment plan related to HLD and patient's adherence to plan as established by provider. . Advised patient to call the office for changes in condition or worsening sx/sx of condition . Provided education to  patient re: managing diet and activity . Reviewed medications with patient and discussed compliance- the patient does not take statins due to breast feeding- manages with her diet . Discussed plans with patient for ongoing care management follow up and provided patient with direct contact information for care management team . Reviewed scheduled/upcoming provider appointments including: 10-15-2020 at 0840 am- currently seeing the provider   Patient Goals/Self-Care Activities: . Over the next 120 days, patient will:   - call for medicine refill 2 or 3 days before it runs out - call if I am sick and can't take my medicine - keep a list of all the medicines I take; vitamins and herbals too - learn to read medicine labels - drink 6 to 8 glasses of water each day - eat 3 to 5 servings of fruits and vegetables each day - eat 5 or 6 small meals each day - limit fast food meals to no more than 1 per week - manage portion size - prepare main meal at home 3 to 5 days each week - read food labels for fat, fiber, carbohydrates and portion size - set a realistic goal - set goal weight - be open to making changes - I can manage, know and watch for signs of a heart attack - if I have chest pain, call for help - learn about small changes that will make a big difference - learn my personal risk factors - collaboration with team encouraged - emotional support provided - empathic listening provided - reassurance provided - self-reliance encouraged - verbalization of feelings encouraged   Follow Up Plan: Telephone follow up appointment with care management team member scheduled for: 12-24-2020 at 0900 am     Care Plan : RNCM: Hypertension (Adult)  Updates made by Bonnie Armstrong since 10/15/2020 12:00 AM    Problem: RNCM: Hypertension (Hypertension)   Priority: Medium    Goal: RNCM: Hypertension Monitored   Priority: Medium  Note:   Objective:  . Last practice recorded BP readings:  . BP  Readings from Last 3 Encounters: .  10/15/20 . 124/83 .  07/17/20 . 107/73 .  04/17/20 . 127/82 .    Marland Kitchen Most recent eGFR/CrCl: No results found for: EGFR  No components found for: CRCL Current Barriers:  Marland Kitchen Knowledge Deficits related to basic understanding of hypertension pathophysiology and self care management . Knowledge Deficits related to understanding of medications prescribed for management of hypertension . Unable to independently manage HTN Case Manager Clinical Goal(s):  Marland Kitchen Over the next 120 days, patient will verbalize understanding of plan for hypertension management . Over the next 120 days, patient will attend all scheduled medical appointments: 10-15-2020 at 0840 am, currently seeing the pcp  . Over the next 120 days, patient will demonstrate improved adherence to prescribed treatment plan for hypertension as evidenced by taking all medications as prescribed, monitoring and recording blood pressure as directed, adhering to low sodium/DASH diet . Over the next 120 days, patient will demonstrate improved health management independence as evidenced by checking blood pressure as directed and notifying PCP if SBP>160 or DBP > 90, taking all medications as prescribe, and adhering to a low sodium diet  as discussed. . Over the next 120 days, patient will verbalize basic understanding of hypertension disease process and self health management plan as evidenced by normalized blood pressure, stabilized weight, and maintaining and maintaining a healthy diet Interventions:  . Evaluation of current treatment plan related to hypertension self management and patient's adherence to plan as established by provider. 10-15-2020: The patient is doing well as far as her HTN.  She says the last couple months have been more hectic as usual at her home and she has had some "off days" but she would not say that she had totally off track. She is still watching her intake and mindful of what she needs to do to keep her  health and well being in good standing.  . Provided education to patient re: stroke prevention, s/s of heart attack and stroke, DASH diet, complications of uncontrolled blood pressure . Reviewed medications with patient and discussed importance of compliance. 10-15-2020: The patient is compliant with her medications regimen  . Discussed plans with patient for ongoing care management follow up and provided patient with direct contact information for care management team . Advised patient, providing education and rationale, to monitor blood pressure daily and record, calling PCP for findings outside established parameters.  . Reviewed scheduled/upcoming provider appointments including: 12-24-2020 at 0900 am with [facility] and will follow up with the pcp as scheduled  Patient Goals/Self-Care Activities . Over the next 120 days, patient will:  - Calls provider office for new concerns, questions, or BP outside discussed parameters Checks BP and records as discussed Follows a low sodium diet/DASH diet - blood pressure trends reviewed - depression screen reviewed - home or ambulatory blood pressure monitoring encouraged Follow Up Plan: Telephone follow up appointment with care management team member scheduled for: 12-24-2020 at 0900 am   Care Plan : RNCM: Diabetes Type 2 (Adult)  Updates made by Bonnie Armstrong since 10/15/2020 12:00 AM    Problem: RNCM: Glycemic Management (Diabetes, Type 2)   Priority: Medium    Goal: RNCM: DM-Glycemic Management Optimized   Priority: Medium  Note:   Objective:  . Lab Results .  Component . Value . Date .   Marland Kitchen HGBA1C . 6.0 . 07/17/2020 .    Lab Results  Component Value Date   CREATININE 0.68 07/17/2020   CREATININE 0.59 04/17/2020   CREATININE 0.58 03/05/2020 .   Marland Kitchen No results found for: EGFR Current Barriers:  Marland Kitchen Knowledge Deficits related to basic Diabetes pathophysiology and self care/management . Knowledge Deficits related to medications used for  management of diabetes . Unable to independently DM Case Manager Clinical Goal(s):  Marland Kitchen Over the next 120 days, patient will demonstrate improved adherence to prescribed treatment plan for diabetes self care/management as evidenced by:  . daily monitoring and recording of CBG  . adherence to ADA/ carb modified diet . exercise 4/5 days/week . adherence to prescribed medication regimen Interventions:  . Provided education to patient about basic DM disease process . Reviewed medications with patient and discussed importance of medication adherence.  The patient is hoping at the next provider visit she can taper Metformin and get off of it. She is doing the Whole 30 program and is steadily losing weight. Her goal is to come off of the Metformin completely. 10-15-2020: The patient is in the office today to see pcp. She says her A1C may be off some because she has had some bad days but she is still wanting to try and get off  of the Metformin. Will discuss with Dr. Wynetta Emery. New A1C results pending. The patient states overall she is doing well.  . Discussed plans with patient for ongoing care management follow up and provided patient with direct contact information for care management team . Provided patient with written educational materials related to hypo and hyperglycemia and importance of correct treatment . Reviewed scheduled/upcoming provider appointments including: 10-15-2020 at [place] am, seeing pcp currently. Will follow up as scheduled  . Advised patient, providing education and rationale, to check cbg BID and record, calling pcp for findings outside established parameters.  10-15-2020: States she is not checking every day but feels it is about the same. She has had some fluctuations in weight but not drastic. She says that there has been several stressors impacting life in her home right now but she is staying focused.  . Review of patient status, including review of consultants reports, relevant laboratory  and other test results, and medications completed. Patient Goals/Self-Care Activities . Over the next 120 days, patient will:  - UNABLE to independently manage DM Adheres to prescribed ADA/carb modified - barriers to adherence to treatment plan identified - blood glucose monitoring encouraged - blood glucose readings reviewed - resources required to improve adherence to care identified - self-awareness of signs/symptoms of hypo or hyperglycemia encouraged - use of blood glucose monitoring log promoted Follow Up Plan: Telephone follow up appointment with care management team member scheduled for: 12-24-2020 at 0900 am     Plan: Telephone follow up appointment with care management team member scheduled for:  12-24-2020 at 0900 am  [place], MSN, [facility] Mobile: [phone]

## 2020-10-15 NOTE — Progress Notes (Signed)
BP 124/83   Pulse 87   Temp 98.6 F (37 C)   Wt 215 lb 9.6 oz (97.8 kg)   SpO2 100%   BMI 38.20 kg/m    Subjective:    Patient ID: Bonnie Armstrong, female    DOB: April 28, 1981, 40 y.o.   MRN: [id number]  HPI: Bonnie Armstrong is a 40 y.o. female  Chief Complaint  Patient presents with  . Diabetes  . Hypertension   DIABETES Hypoglycemic episodes:no Polydipsia/polyuria: no Visual disturbance: no Chest pain: no Paresthesias: no Glucose Monitoring: no  Accucheck frequency: Not Checking Taking Insulin?: no Blood Pressure Monitoring: not checking Retinal Examination: Not up to Date Foot Exam: Up to Date Diabetic Education: Completed Pneumovax: Up to Date Influenza: Not up to Date Aspirin: no  Relevant past medical, surgical, family and social history reviewed and updated as indicated. Interim medical history since our last visit reviewed. Allergies and medications reviewed and updated.  Review of Systems  Constitutional: Negative.   Respiratory: Negative.   Cardiovascular: Negative.   Gastrointestinal: Negative.   Psychiatric/Behavioral: Negative.     Per HPI unless specifically indicated above     Objective:    BP 124/83   Pulse 87   Temp 98.6 F (37 C)   Wt 215 lb 9.6 oz (97.8 kg)   SpO2 100%   BMI 38.20 kg/m   Wt Readings from Last 3 Encounters:  10/15/20 215 lb 9.6 oz (97.8 kg)  07/17/20 216 lb 12.8 oz (98.3 kg)  06/25/20 216 lb (98 kg)    Physical Exam Vitals and nursing note reviewed.  Constitutional:      General: She is not in acute distress.    Appearance: Normal appearance. She is not ill-appearing, toxic-appearing or diaphoretic.  HENT:     Head: Normocephalic and atraumatic.     Right Ear: External ear normal.     Left Ear: External ear normal.     Nose: Nose normal.     Mouth/Throat:     Mouth: Mucous membranes are moist.     Pharynx: Oropharynx is clear.  Eyes:     General: No scleral icterus.       Right eye: No discharge.         Left eye: No discharge.     Extraocular Movements: Extraocular movements intact.     Conjunctiva/sclera: Conjunctivae normal.     Pupils: Pupils are equal, round, and reactive to light.  Cardiovascular:     Rate and Rhythm: Normal rate and regular rhythm.     Pulses: Normal pulses.     Heart sounds: Normal heart sounds. No murmur heard. No friction rub. No gallop.   Pulmonary:     Effort: Pulmonary effort is normal. No respiratory distress.     Breath sounds: Normal breath sounds. No stridor. No wheezing, rhonchi or rales.  Chest:     Chest wall: No tenderness.  Musculoskeletal:        General: Normal range of motion.     Cervical back: Normal range of motion and neck supple.  Skin:    General: Skin is warm and dry.     Capillary Refill: Capillary refill takes less than 2 seconds.     Coloration: Skin is not jaundiced or pale.     Findings: No bruising, erythema, lesion or rash.  Neurological:     General: No focal deficit present.     Mental Status: She is alert and oriented to person, place, and time. Mental  status is at baseline.  Psychiatric:        Mood and Affect: Mood normal.        Behavior: Behavior normal.        Thought Content: Thought content normal.        Judgment: Judgment normal.     Results for orders placed or performed in visit on 07/17/20  Bayer DCA Hb A1c Waived (STAT)  Result Value Ref Range   HB A1C (BAYER DCA - WAIVED) 6.0 <7.0 %  Comprehensive metabolic panel  Result Value Ref Range   Glucose 136 (H) 65 - 99 mg/dL   BUN 10 6 - 24 mg/dL   Creatinine, Ser 6.43 0.57 - 1.00 mg/dL   GFR calc non Af Amer 109 >59 mL/min/1.73   GFR calc Af Amer 126 >59 mL/min/1.73   BUN/Creatinine Ratio 15 9 - 23   Sodium 140 134 - 144 mmol/L   Potassium 3.9 3.5 - 5.2 mmol/L   Chloride 104 96 - 106 mmol/L   CO2 22 20 - 29 mmol/L   Calcium 9.0 8.7 - 10.2 mg/dL   Total Protein 6.4 6.0 - 8.5 g/dL   Albumin 4.1 3.8 - 4.8 g/dL   Globulin, Total 2.3 1.5 - 4.5  g/dL   Albumin/Globulin Ratio 1.8 1.2 - 2.2   Bilirubin Total 0.5 0.0 - 1.2 mg/dL   Alkaline Phosphatase 70 44 - 121 IU/L   AST 12 0 - 40 IU/L   ALT 15 0 - 32 IU/L  Lipid Panel w/o Chol/HDL Ratio  Result Value Ref Range   Cholesterol, Total 180 100 - 199 mg/dL   Triglycerides 88 0 - 149 mg/dL   HDL 42 >32 mg/dL   VLDL Cholesterol Cal 16 5 - 40 mg/dL   LDL Chol Calc (NIH) 951 (H) 0 - 99 mg/dL  Microalbumin, Urine Waived  Result Value Ref Range   Microalb, Ur Waived 30 (H) 0 - 19 mg/L   Creatinine, Urine Waived 300 10 - 300 mg/dL   Microalb/Creat Ratio <30 <30 mg/g      Assessment & Plan:   Problem List Items Addressed This Visit      Endocrine   Uncontrolled type 2 diabetes mellitus with hyperglycemia (HCC) - Primary   Relevant Orders   Bayer DCA Hb A1c Waived       Follow up plan: No follow-ups on file.

## 2020-10-15 NOTE — Patient Instructions (Signed)
Visit Information  Patient Care Plan: RNCM: HLD Plan of care    Problem Identified: RNCM: HLD Management   Priority: Medium    Goal: RNCM: Management of HLD   Priority: Medium  Note:   Current Barriers:  . Poorly controlled hyperlipidemia, complicated by HTN, DM2, Obesity . Current antihyperlipidemic regimen: does not take statins due to breast feeding  . Most recent lipid panel:     Component Value Date/Time   CHOL 180 07/17/2020 1012   TRIG 88 07/17/2020 1012   HDL 42 07/17/2020 1012   CHOLHDL 4.1 03/13/2018 1151   VLDL 25 04/21/2016 0001   LDLCALC 122 (H) 07/17/2020 1012   LDLCALC 136 (H) 03/13/2018 1151 .   Marland Kitchen ASCVD risk enhancing conditions: age 40, DM, HTN . Unable to independently manage HLD  RN Care Manager Clinical Goal(s):  Marland Kitchen Over the next 120 days, patient will work with Consulting civil engineer, providers, and care team towards execution of optimized self-health management plan . Over the next 120 days, patient will verbalize understanding of plan for HLD . Over the next 120 days, patient will work with [facility], [place] team, and pcp to address needs related to HLD and education needs  . Over the next 120 days, patient will attend all scheduled medical appointments: 10-15-2020 at 0840 am  Interventions: Marland Kitchen Medication review performed; medication list updated in electronic medical record.  Bertram Savin care team collaboration (see longitudinal plan of care) . Referred to pharmacy team for assistance with HLD medication management . Evaluation of current treatment plan related to HLD and patient's adherence to plan as established by provider. . Advised patient to call the office for changes in condition or worsening sx/sx of condition . Provided education to patient re: managing diet and activity . Reviewed medications with patient and discussed compliance- the patient does not take statins due to breast feeding- manages with her diet . Discussed plans with patient for ongoing  care management follow up and provided patient with direct contact information for care management team . Reviewed scheduled/upcoming provider appointments including: 10-15-2020 at 0840 am- currently seeing the provider   Patient Goals/Self-Care Activities: . Over the next 120 days, patient will:   - call for medicine refill 2 or 3 days before it runs out - call if I am sick and can't take my medicine - keep a list of all the medicines I take; vitamins and herbals too - learn to read medicine labels - drink 6 to 8 glasses of water each day - eat 3 to 5 servings of fruits and vegetables each day - eat 5 or 6 small meals each day - limit fast food meals to no more than 1 per week - manage portion size - prepare main meal at home 3 to 5 days each week - read food labels for fat, fiber, carbohydrates and portion size - set a realistic goal - set goal weight - be open to making changes - I can manage, know and watch for signs of a heart attack - if I have chest pain, call for help - learn about small changes that will make a big difference - learn my personal risk factors - collaboration with team encouraged - emotional support provided - empathic listening provided - reassurance provided - self-reliance encouraged - verbalization of feelings encouraged   Follow Up Plan: Telephone follow up appointment with care management team member scheduled for: 12-24-2020 at 0900 am     Task: RNCM: HLD management  Note:   Care Management Activities:    - collaboration with team encouraged - emotional support provided - empathic listening provided - reassurance provided - self-reliance encouraged - verbalization of feelings encouraged       Patient Care Plan: RNCM: Hypertension (Adult)    Problem Identified: RNCM: Hypertension (Hypertension)   Priority: Medium    Goal: RNCM: Hypertension Monitored   Priority: Medium  Note:   Objective:  . Last practice recorded BP readings:   . BP Readings from Last 3 Encounters: .  10/15/20 . 124/83 .  07/17/20 . 107/73 .  04/17/20 . 127/82 .    Marland Kitchen Most recent eGFR/CrCl: No results found for: EGFR  No components found for: CRCL Current Barriers:  Marland Kitchen Knowledge Deficits related to basic understanding of hypertension pathophysiology and self care management . Knowledge Deficits related to understanding of medications prescribed for management of hypertension . Unable to independently manage HTN Case Manager Clinical Goal(s):  Marland Kitchen Over the next 120 days, patient will verbalize understanding of plan for hypertension management . Over the next 120 days, patient will attend all scheduled medical appointments: 10-15-2020 at 0840 am, currently seeing the pcp  . Over the next 120 days, patient will demonstrate improved adherence to prescribed treatment plan for hypertension as evidenced by taking all medications as prescribed, monitoring and recording blood pressure as directed, adhering to low sodium/DASH diet . Over the next 120 days, patient will demonstrate improved health management independence as evidenced by checking blood pressure as directed and notifying PCP if SBP>160 or DBP > 90, taking all medications as prescribe, and adhering to a low sodium diet as discussed. . Over the next 120 days, patient will verbalize basic understanding of hypertension disease process and self health management plan as evidenced by normalized blood pressure, stabilized weight, and maintaining and maintaining a healthy diet Interventions:  . Evaluation of current treatment plan related to hypertension self management and patient's adherence to plan as established by provider. 10-15-2020: The patient is doing well as far as her HTN.  She says the last couple months have been more hectic as usual at her home and she has had some "off days" but she would not say that she had totally off track. She is still watching her intake and mindful of what she needs to do to  keep her health and well being in good standing.  . Provided education to patient re: stroke prevention, s/s of heart attack and stroke, DASH diet, complications of uncontrolled blood pressure . Reviewed medications with patient and discussed importance of compliance. 10-15-2020: The patient is compliant with her medications regimen  . Discussed plans with patient for ongoing care management follow up and provided patient with direct contact information for care management team . Advised patient, providing education and rationale, to monitor blood pressure daily and record, calling PCP for findings outside established parameters.  . Reviewed scheduled/upcoming provider appointments including: 12-24-2020 at 0900 am with the [facility] and will follow up with the pcp as scheduled  Patient Goals/Self-Care Activities . Over the next 120 days, patient will:  - Calls provider office for new concerns, questions, or BP outside discussed parameters Checks BP and records as discussed Follows a low sodium diet/DASH diet - blood pressure trends reviewed - depression screen reviewed - home or ambulatory blood pressure monitoring encouraged Follow Up Plan: Telephone follow up appointment with care management team member scheduled for: 12-24-2020 at 0900 am   Task: RNCM: Identify and Monitor Blood Pressure Elevation  Note:   Care Management Activities:    - blood pressure trends reviewed - depression screen reviewed - home or ambulatory blood pressure monitoring encouraged       Patient Care Plan: RNCM: Diabetes Type 2 (Adult)    Problem Identified: RNCM: Glycemic Management (Diabetes, Type 2)   Priority: Medium    Goal: RNCM: DM-Glycemic Management Optimized   Priority: Medium  Note:   Objective:  . Lab Results .  Component . Value . Date .   Marland Kitchen HGBA1C . 6.0 . 07/17/2020 .    Lab Results  Component Value Date   CREATININE 0.68 07/17/2020   CREATININE 0.59 04/17/2020   CREATININE 0.58 03/05/2020  .   Marland Kitchen No results found for: EGFR Current Barriers:  Marland Kitchen Knowledge Deficits related to basic Diabetes pathophysiology and self care/management . Knowledge Deficits related to medications used for management of diabetes . Unable to independently DM Case Manager Clinical Goal(s):  Marland Kitchen Over the next 120 days, patient will demonstrate improved adherence to prescribed treatment plan for diabetes self care/management as evidenced by:  . daily monitoring and recording of CBG  . adherence to ADA/ carb modified diet . exercise 4/5 days/week . adherence to prescribed medication regimen Interventions:  . Provided education to patient about basic DM disease process . Reviewed medications with patient and discussed importance of medication adherence.  The patient is hoping at the next provider visit she can taper Metformin and get off of it. She is doing the Whole 30 program and is steadily losing weight. Her goal is to come off of the Metformin completely. 10-15-2020: The patient is in the office today to see pcp. She says her A1C may be off some because she has had some bad days but she is still wanting to try and get off of the Metformin. Will discuss with Dr. Laural Benes. New A1C results pending. The patient states overall she is doing well.  . Discussed plans with patient for ongoing care management follow up and provided patient with direct contact information for care management team . Provided patient with written educational materials related to hypo and hyperglycemia and importance of correct treatment . Reviewed scheduled/upcoming provider appointments including: 10-15-2020 at 0840 am, seeing pcp currently. Will follow up as scheduled  . Advised patient, providing education and rationale, to check cbg BID and record, calling pcp for findings outside established parameters.  10-15-2020: States she is not checking every day but feels it is about the same. She has had some fluctuations in weight but not drastic. She  says that there has been several stressors impacting life in her home right now but she is staying focused.  . Review of patient status, including review of consultants reports, relevant laboratory and other test results, and medications completed. Patient Goals/Self-Care Activities . Over the next 120 days, patient will:  - UNABLE to independently manage DM Adheres to prescribed ADA/carb modified - barriers to adherence to treatment plan identified - blood glucose monitoring encouraged - blood glucose readings reviewed - resources required to improve adherence to care identified - self-awareness of signs/symptoms of hypo or hyperglycemia encouraged - use of blood glucose monitoring log promoted Follow Up Plan: Telephone follow up appointment with care management team member scheduled for: 12-24-2020 at 0900 am   Task: RNCM: Alleviate Barriers to Glycemic Management   Note:   Care Management Activities:    - barriers to adherence to treatment plan identified - blood glucose monitoring encouraged - blood glucose readings reviewed -  resources required to improve adherence to care identified - self-awareness of signs/symptoms of hypo or hyperglycemia encouraged - use of blood glucose monitoring log promoted         Patient verbalizes understanding of instructions provided today and agrees to view in [place].   Telephone follow up appointment with care management team member scheduled for: 12-24-2020 at 900 am   [place], MSN, [facility] Mobile: [phone]

## 2020-12-24 ENCOUNTER — Ambulatory Visit: Payer: Self-pay | Admitting: General Practice

## 2020-12-24 ENCOUNTER — Telehealth: Payer: Self-pay | Admitting: General Practice

## 2020-12-24 DIAGNOSIS — I10 Essential (primary) hypertension: Secondary | ICD-10-CM

## 2020-12-24 DIAGNOSIS — E1165 Type 2 diabetes mellitus with hyperglycemia: Secondary | ICD-10-CM

## 2020-12-24 DIAGNOSIS — E782 Mixed hyperlipidemia: Secondary | ICD-10-CM

## 2020-12-24 NOTE — Chronic Care Management (AMB) (Signed)
 Care Management    RN Visit Note  12/24/2020 Name: Bonnie Armstrong MRN: [id number] DOB: 01/02/1981  Subjective: Bonnie Armstrong is a 39 y.o. year old female who is a primary care patient of Johnson, Megan P, DO. The care management team was consulted for assistance with disease management and care coordination needs.    Engaged with patient by telephone for follow up visit in response to provider referral for case management and/or care coordination services.   Consent to Services:   [name] was given information about Care Management services today including:  1. Care Management services includes personalized support from designated clinical staff supervised by her physician, including individualized plan of care and coordination with other care providers 2. 24/7 contact phone numbers for assistance for urgent and routine care needs. 3. The patient may stop case management services at any time by phone call to the office staff.  Patient agreed to services and consent obtained.   Assessment: Review of patient past medical history, allergies, medications, health status, including review of consultants reports, laboratory and other test data, was performed as part of comprehensive evaluation and provision of chronic care management services.   SDOH (Social Determinants of Health) assessments and interventions performed:    Care Plan  No Known Allergies  Outpatient Encounter Medications as of 12/24/2020  Medication Sig  . amLODipine (NORVASC) 5 MG tablet Take 1 tablet (5 mg total) by mouth daily.  . metFORMIN (GLUCOPHAGE-XR) 500 MG 24 hr tablet Take 1 tablet (500 mg total) by mouth in the morning and at bedtime.   No facility-administered encounter medications on file as of 12/24/2020.    Patient Active Problem List   Diagnosis Date Noted  . Essential hypertension 01/24/2020  . Hyperlipidemia, mixed 02/23/2019  . SOBOE (shortness of breath on exertion) 02/23/2019  .  Uncontrolled type 2 diabetes mellitus with hyperglycemia (HCC) 03/13/2018  . Palpitations 04/15/2016  . Morbid obesity (HCC) 02/28/2015    Conditions to be addressed/monitored: HTN, HLD and DMII  Care Plan : RNCM: HLD Plan of care  Updates made by Tate, Pamela J since 12/24/2020 12:00 AM    Problem: RNCM: HLD Management   Priority: Medium    Goal: RNCM: Management of HLD   Priority: Medium  Note:   Current Barriers:  . Poorly controlled hyperlipidemia, complicated by HTN, DM2, Obesity . Current antihyperlipidemic regimen: does not take statins due to breast feeding  . Most recent lipid panel:     Component Value Date/Time   CHOL 180 07/17/2020 1012   TRIG 88 07/17/2020 1012   HDL 42 07/17/2020 1012   CHOLHDL 4.1 03/13/2018 1151   VLDL 25 04/21/2016 0001   LDLCALC 122 (H) 07/17/2020 1012   LDLCALC 136 (H) 03/13/2018 1151 .   . ASCVD risk enhancing conditions: age 39, DM, HTN . Unable to independently manage HLD  RN Care Manager Clinical Goal(s):  . Over the next 120 days, patient will work with RN Care Manager, providers, and care team towards execution of optimized self-health management plan . Over the next 120 days, patient will verbalize understanding of plan for HLD . Over the next 120 days, patient will work with RNCM, CCM team, and pcp to address needs related to HLD and education needs  . Over the next 120 days, patient will attend all scheduled medical appointments: 01-21-2021  Interventions: . Medication review performed; medication list updated in electronic medical record.  . Inter-disciplinary care team collaboration (see longitudinal plan of care) .   Referred to pharmacy team for assistance with HLD medication management . Evaluation of current treatment plan related to HLD and patient's adherence to plan as established by provider. 12-24-2020:The patient is doing well. Denies any new concerns related to HLD. Sees the pcp in June and will have new blood work.   . Advised patient to call the office for changes in condition or worsening sx/sx of condition . Provided education to patient re: managing diet and activity. 12-24-2020: Is maintaining he active life style, busy with her kids age 3 up to 19.  Still eating healthy and staying very active.  . Reviewed medications with patient and discussed compliance- the patient does not take statins due to breast feeding- manages with her diet . Discussed plans with patient for ongoing care management follow up and provided patient with direct contact information for care management team . Reviewed scheduled/upcoming provider appointments including: 01-21-2021   Patient Goals/Self-Care Activities: . Over the next 120 days, patient will:   - call for medicine refill 2 or 3 days before it runs out - call if I am sick and can't take my medicine - keep a list of all the medicines I take; vitamins and herbals too - learn to read medicine labels - drink 6 to 8 glasses of water each day - eat 3 to 5 servings of fruits and vegetables each day - eat 5 or 6 small meals each day - limit fast food meals to no more than 1 per week - manage portion size - prepare main meal at home 3 to 5 days each week - read food labels for fat, fiber, carbohydrates and portion size - set a realistic goal - set goal weight - be open to making changes - I can manage, know and watch for signs of a heart attack - if I have chest pain, call for help - learn about small changes that will make a big difference - learn my personal risk factors - collaboration with team encouraged - emotional support provided - empathic listening provided - reassurance provided - self-reliance encouraged - verbalization of feelings encouraged   Follow Up Plan: Telephone follow up appointment with care management team member scheduled for: 03-11-2021 at 0900 am     Care Plan : RNCM: Hypertension (Adult)  Updates made by Tate, Pamela J since 12/24/2020  12:00 AM    Problem: RNCM: Hypertension (Hypertension)   Priority: Medium    Long-Range Goal: RNCM: Hypertension Monitored   Priority: Medium  Note:   Objective:  . Last practice recorded BP readings:  . BP Readings from Last 3 Encounters: .  10/15/20 . 124/83 .  07/17/20 . 107/73 .  04/17/20 . 127/82 .    . Most recent eGFR/CrCl: No results found for: EGFR  No components found for: CRCL Current Barriers:  . Knowledge Deficits related to basic understanding of hypertension pathophysiology and self care management . Knowledge Deficits related to understanding of medications prescribed for management of hypertension . Unable to independently manage HTN Case Manager Clinical Goal(s):  . Over the next 120 days, patient will verbalize understanding of plan for hypertension management . Over the next 120 days, patient will attend all scheduled medical appointments: 01-21-2021 . Over the next 120 days, patient will demonstrate improved adherence to prescribed treatment plan for hypertension as evidenced by taking all medications as prescribed, monitoring and recording blood pressure as directed, adhering to low sodium/DASH diet . Over the next 120 days, patient will demonstrate improved health management   independence as evidenced by checking blood pressure as directed and notifying PCP if SBP>160 or DBP > 90, taking all medications as prescribe, and adhering to a low sodium diet as discussed. . Over the next 120 days, patient will verbalize basic understanding of hypertension disease process and self health management plan as evidenced by normalized blood pressure, stabilized weight, and maintaining and maintaining a healthy diet Interventions:  . Evaluation of current treatment plan related to hypertension self management and patient's adherence to plan as established by provider. 10-15-2020: The patient is doing well as far as her HTN.  She says the last couple months have been more hectic as usual  at her home and she has had some "off days" but she would not say that she had totally off track. She is still watching her intake and mindful of what she needs to do to keep her health and well being in good standing. 12-24-2020: States she is doing well and her blood pressures have been stable. Denies any elevated blood pressures. Has been a busy time in her home but she is staying the course with her health and well being.  . Provided education to patient re: stroke prevention, s/s of heart attack and stroke, DASH diet, complications of uncontrolled blood pressure . Reviewed medications with patient and discussed importance of compliance. 12-24-2020: The patient is compliant with her medications regimen  . Discussed plans with patient for ongoing care management follow up and provided patient with direct contact information for care management team . Advised patient, providing education and rationale, to monitor blood pressure daily and record, calling PCP for findings outside established parameters.  . Reviewed scheduled/upcoming provider appointments including: 01-21-2021 Patient Goals/Self-Care Activities . Over the next 120 days, patient will:  - Calls provider office for new concerns, questions, or BP outside discussed parameters Checks BP and records as discussed Follows a low sodium diet/DASH diet - blood pressure trends reviewed - depression screen reviewed - home or ambulatory blood pressure monitoring encouraged Follow Up Plan: Telephone follow up appointment with care management team member scheduled for: 03-11-2021 at 0900 am   Care Plan : RNCM: Diabetes Type 2 (Adult)  Updates made by Vanita Ingles since 12/24/2020 12:00 AM    Problem: RNCM: Glycemic Management (Diabetes, Type 2)   Priority: Medium    Long-Range Goal: RNCM: DM-Glycemic Management Optimized   Priority: Medium  Note:   Objective:  . Lab Results .  Component . Value . Date .   Marland Kitchen HGBA1C . 5.8 . 10/15/2020 .      Lab Results  Component Value Date   CREATININE 0.68 07/17/2020   CREATININE 0.59 04/17/2020   CREATININE 0.58 03/05/2020 .   Marland Kitchen No results found for: EGFR Current Barriers:  Marland Kitchen Knowledge Deficits related to basic Diabetes pathophysiology and self care/management . Knowledge Deficits related to medications used for management of diabetes . Unable to independently DM Case Manager Clinical Goal(s):  Marland Kitchen Over the next 120 days, patient will demonstrate improved adherence to prescribed treatment plan for diabetes self care/management as evidenced by:  . daily monitoring and recording of CBG  . adherence to ADA/ carb modified diet . exercise 4/5 days/week . adherence to prescribed medication regimen Interventions:  . Provided education to patient about basic DM disease process . Reviewed medications with patient and discussed importance of medication adherence.  The patient is hoping at the next provider visit she can taper Metformin and get off of it. She is doing the  Whole 30 program and is steadily losing weight. Her goal is to come off of the Metformin completely. 10-15-2020: The patient is in the office today to see pcp. She says her A1C may be off some because she has had some bad days but she is still wanting to try and get off of the Metformin. Will discuss with Dr. Johnson. New A1C results pending. The patient states overall she is doing well. 12-24-2020: The patient is doing well and states her readings are around 120 to 130 consistently. She is hopeful her A1C will maintain and her goal is to come off of the Metformin completely. She is still maintaining a health diet and staying active.   . Discussed plans with patient for ongoing care management follow up and provided patient with direct contact information for care management team . Provided patient with written educational materials related to hypo and hyperglycemia and importance of correct treatment . Reviewed scheduled/upcoming provider  appointments including: 01-21-2021 at 0920 am . Advised patient, providing education and rationale, to check cbg BID and record, calling pcp for findings outside established parameters.  10-15-2020: States she is not checking every day but feels it is about the same. She has had some fluctuations in weight but not drastic. She says that there has been several stressors impacting life in her home right now but she is staying focused. 12-24-2020: States her readings are around 120 to 130. Sometimes she has readings a little below 120 but for the most part it is consistently staying in 120's. . Review of patient status, including review of consultants reports, relevant laboratory and other test results, and medications completed. Patient Goals/Self-Care Activities . Over the next 120 days, patient will:  - UNABLE to independently manage DM Adheres to prescribed ADA/carb modified - barriers to adherence to treatment plan identified - blood glucose monitoring encouraged - blood glucose readings reviewed - resources required to improve adherence to care identified - self-awareness of signs/symptoms of hypo or hyperglycemia encouraged - use of blood glucose monitoring log promoted Follow Up Plan: Telephone follow up appointment with care management team member scheduled for: 03-11-2021 at 0900 am     Plan: Telephone follow up appointment with care management team member scheduled for:  03-11-2021 at 0900 am  Pam Tate RN, MSN, CCM Community Care Coordinator [place]  [facility] Mobile: [phone]          

## 2020-12-24 NOTE — Patient Instructions (Signed)
Visit Information  Patient Care Plan: RNCM: HLD Plan of care    Problem Identified: RNCM: HLD Management   Priority: Medium    Goal: RNCM: Management of HLD   Priority: Medium  Note:   Current Barriers:  . Poorly controlled hyperlipidemia, complicated by HTN, DM2, Obesity . Current antihyperlipidemic regimen: does not take statins due to breast feeding  . Most recent lipid panel:     Component Value Date/Time   CHOL 180 07/17/2020 1012   TRIG 88 07/17/2020 1012   HDL 42 07/17/2020 1012   CHOLHDL 4.1 03/13/2018 1151   VLDL 25 04/21/2016 0001   LDLCALC 122 (H) 07/17/2020 1012   LDLCALC 136 (H) 03/13/2018 1151 .   Marland Kitchen ASCVD risk enhancing conditions: age 69, DM, HTN . Unable to independently manage HLD  RN Care Manager Clinical Goal(s):  Marland Kitchen Over the next 120 days, patient will work with Consulting civil engineer, providers, and care team towards execution of optimized self-health management plan . Over the next 120 days, patient will verbalize understanding of plan for HLD . Over the next 120 days, patient will work with [facility], [place] team, and pcp to address needs related to HLD and education needs  . Over the next 120 days, patient will attend all scheduled medical appointments: 01-21-2021  Interventions: Marland Kitchen Medication review performed; medication list updated in electronic medical record.  Bertram Savin care team collaboration (see longitudinal plan of care) . Referred to pharmacy team for assistance with HLD medication management . Evaluation of current treatment plan related to HLD and patient's adherence to plan as established by provider. 12-24-2020:The patient is doing well. Denies any new concerns related to HLD. Sees the pcp in June and will have new blood work.  . Advised patient to call the office for changes in condition or worsening sx/sx of condition . Provided education to patient re: managing diet and activity. 12-24-2020: Is maintaining he active life style, busy with her kids  age 61 up to 58.  Still eating healthy and staying very active.  . Reviewed medications with patient and discussed compliance- the patient does not take statins due to breast feeding- manages with her diet . Discussed plans with patient for ongoing care management follow up and provided patient with direct contact information for care management team . Reviewed scheduled/upcoming provider appointments including: 01-21-2021   Patient Goals/Self-Care Activities: . Over the next 120 days, patient will:   - call for medicine refill 2 or 3 days before it runs out - call if I am sick and can't take my medicine - keep a list of all the medicines I take; vitamins and herbals too - learn to read medicine labels - drink 6 to 8 glasses of water each day - eat 3 to 5 servings of fruits and vegetables each day - eat 5 or 6 small meals each day - limit fast food meals to no more than 1 per week - manage portion size - prepare main meal at home 3 to 5 days each week - read food labels for fat, fiber, carbohydrates and portion size - set a realistic goal - set goal weight - be open to making changes - I can manage, know and watch for signs of a heart attack - if I have chest pain, call for help - learn about small changes that will make a big difference - learn my personal risk factors - collaboration with team encouraged - emotional support provided - empathic listening provided - reassurance  provided - self-reliance encouraged - verbalization of feelings encouraged   Follow Up Plan: Telephone follow up appointment with care management team member scheduled for: 03-11-2021 at 0900 am     Task: RNCM: HLD management   Note:   Care Management Activities:    - collaboration with team encouraged - emotional support provided - empathic listening provided - reassurance provided - self-reliance encouraged - verbalization of feelings encouraged       Patient Care Plan: RNCM: Hypertension  (Adult)    Problem Identified: RNCM: Hypertension (Hypertension)   Priority: Medium    Long-Range Goal: RNCM: Hypertension Monitored   Priority: Medium  Note:   Objective:  . Last practice recorded BP readings:  . BP Readings from Last 3 Encounters: .  10/15/20 . 124/83 .  07/17/20 . 107/73 .  04/17/20 . 127/82 .    Marland Kitchen Most recent eGFR/CrCl: No results found for: EGFR  No components found for: CRCL Current Barriers:  Marland Kitchen Knowledge Deficits related to basic understanding of hypertension pathophysiology and self care management . Knowledge Deficits related to understanding of medications prescribed for management of hypertension . Unable to independently manage HTN Case Manager Clinical Goal(s):  Marland Kitchen Over the next 120 days, patient will verbalize understanding of plan for hypertension management . Over the next 120 days, patient will attend all scheduled medical appointments: 01-21-2021 . Over the next 120 days, patient will demonstrate improved adherence to prescribed treatment plan for hypertension as evidenced by taking all medications as prescribed, monitoring and recording blood pressure as directed, adhering to low sodium/DASH diet . Over the next 120 days, patient will demonstrate improved health management independence as evidenced by checking blood pressure as directed and notifying PCP if SBP>160 or DBP > 90, taking all medications as prescribe, and adhering to a low sodium diet as discussed. . Over the next 120 days, patient will verbalize basic understanding of hypertension disease process and self health management plan as evidenced by normalized blood pressure, stabilized weight, and maintaining and maintaining a healthy diet Interventions:  . Evaluation of current treatment plan related to hypertension self management and patient's adherence to plan as established by provider. 10-15-2020: The patient is doing well as far as her HTN.  She says the last couple months have been more  hectic as usual at her home and she has had some "off days" but she would not say that she had totally off track. She is still watching her intake and mindful of what she needs to do to keep her health and well being in good standing. 12-24-2020: States she is doing well and her blood pressures have been stable. Denies any elevated blood pressures. Has been a busy time in her home but she is staying the course with her health and well being.  . Provided education to patient re: stroke prevention, s/s of heart attack and stroke, DASH diet, complications of uncontrolled blood pressure . Reviewed medications with patient and discussed importance of compliance. 12-24-2020: The patient is compliant with her medications regimen  . Discussed plans with patient for ongoing care management follow up and provided patient with direct contact information for care management team . Advised patient, providing education and rationale, to monitor blood pressure daily and record, calling PCP for findings outside established parameters.  . Reviewed scheduled/upcoming provider appointments including: 01-21-2021 Patient Goals/Self-Care Activities . Over the next 120 days, patient will:  - Calls provider office for new concerns, questions, or BP outside discussed parameters Checks BP and  records as discussed Follows a low sodium diet/DASH diet - blood pressure trends reviewed - depression screen reviewed - home or ambulatory blood pressure monitoring encouraged Follow Up Plan: Telephone follow up appointment with care management team member scheduled for: 03-11-2021 at 0900 am   Task: RNCM: Identify and Monitor Blood Pressure Elevation   Note:   Care Management Activities:    - blood pressure trends reviewed - depression screen reviewed - home or ambulatory blood pressure monitoring encouraged       Patient Care Plan: RNCM: Diabetes Type 2 (Adult)    Problem Identified: RNCM: Glycemic Management (Diabetes, Type 2)    Priority: Medium    Long-Range Goal: RNCM: DM-Glycemic Management Optimized   Priority: Medium  Note:   Objective:  . Lab Results .  Component . Value . Date .   Marland Kitchen HGBA1C . 5.8 . 10/15/2020 .     Lab Results  Component Value Date   CREATININE 0.68 07/17/2020   CREATININE 0.59 04/17/2020   CREATININE 0.58 03/05/2020 .   Marland Kitchen No results found for: EGFR Current Barriers:  Marland Kitchen Knowledge Deficits related to basic Diabetes pathophysiology and self care/management . Knowledge Deficits related to medications used for management of diabetes . Unable to independently DM Case Manager Clinical Goal(s):  Marland Kitchen Over the next 120 days, patient will demonstrate improved adherence to prescribed treatment plan for diabetes self care/management as evidenced by:  . daily monitoring and recording of CBG  . adherence to ADA/ carb modified diet . exercise 4/5 days/week . adherence to prescribed medication regimen Interventions:  . Provided education to patient about basic DM disease process . Reviewed medications with patient and discussed importance of medication adherence.  The patient is hoping at the next provider visit she can taper Metformin and get off of it. She is doing the Whole 30 program and is steadily losing weight. Her goal is to come off of the Metformin completely. 10-15-2020: The patient is in the office today to see pcp. She says her A1C may be off some because she has had some bad days but she is still wanting to try and get off of the Metformin. Will discuss with Dr. Wynetta Emery. New A1C results pending. The patient states overall she is doing well. 12-24-2020: The patient is doing well and states her readings are around 120 to 130 consistently. She is hopeful her A1C will maintain and her goal is to come off of the Metformin completely. She is still maintaining a health diet and staying active.   . Discussed plans with patient for ongoing care management follow up and provided patient with direct  contact information for care management team . Provided patient with written educational materials related to hypo and hyperglycemia and importance of correct treatment . Reviewed scheduled/upcoming provider appointments including: 01-21-2021 at 0920 am . Advised patient, providing education and rationale, to check cbg BID and record, calling pcp for findings outside established parameters.  10-15-2020: States she is not checking every day but feels it is about the same. She has had some fluctuations in weight but not drastic. She says that there has been several stressors impacting life in her home right now but she is staying focused. 12-24-2020: States her readings are around 120 to 130. Sometimes she has readings a little below 120 but for the most part it is consistently staying in 120's. . Review of patient status, including review of consultants reports, relevant laboratory and other test results, and medications completed. Patient Goals/Self-Care Activities .  Over the next 120 days, patient will:  - UNABLE to independently manage DM Adheres to prescribed ADA/carb modified - barriers to adherence to treatment plan identified - blood glucose monitoring encouraged - blood glucose readings reviewed - resources required to improve adherence to care identified - self-awareness of signs/symptoms of hypo or hyperglycemia encouraged - use of blood glucose monitoring log promoted Follow Up Plan: Telephone follow up appointment with care management team member scheduled for: 03-11-2021 at 0900 am   Task: RNCM: Alleviate Barriers to Glycemic Management   Note:   Care Management Activities:    - barriers to adherence to treatment plan identified - blood glucose monitoring encouraged - blood glucose readings reviewed - resources required to improve adherence to care identified - self-awareness of signs/symptoms of hypo or hyperglycemia encouraged - use of blood glucose monitoring log promoted          Patient verbalizes understanding of instructions provided today and agrees to view in [place].   Telephone follow up appointment with care management team member scheduled for:03-11-2021 at 0900 am  [place], MSN, [facility] Mobile: [phone]

## 2021-01-01 ENCOUNTER — Telehealth: Payer: Self-pay | Admitting: Licensed Clinical Social Worker

## 2021-01-01 ENCOUNTER — Telehealth: Payer: 59

## 2021-01-01 NOTE — Telephone Encounter (Signed)
Clinical Social Work  Care Management   Phone Outreach    01/01/2021 Name: Bonnie Armstrong MRN: [id number] DOB: 1981/04/29  Bonnie Armstrong is a 40 y.o. year old female who is a primary care patient of Dorcas Carrow, DO .   CCM LCSW reached out to patient today by phone to introduce self, assess needs and offer Care Management services and interventions.    Telephone outreach was unsuccessful  Plan:CCM LCSW will wait for return call. If no return call is received, Will route chart to Care Guide to see if patient would like to reschedule phone appointment   Review of patient status, including review of consultants reports, relevant laboratory and other test results, and collaboration with appropriate care team members and the patient's provider was performed as part of comprehensive patient evaluation and provision of care management services.    Jenel Lucks, MSW, LCSW [facility] Care Management [place]  [email or web address] Phone [phone] 2:10 PM a

## 2021-01-05 ENCOUNTER — Encounter: Payer: Self-pay | Admitting: Family Medicine

## 2021-01-06 NOTE — Telephone Encounter (Signed)
Patient has been rescheduled.

## 2021-01-21 ENCOUNTER — Ambulatory Visit: Payer: 59 | Admitting: Family Medicine

## 2021-02-12 ENCOUNTER — Ambulatory Visit: Payer: 59 | Admitting: Licensed Clinical Social Worker

## 2021-02-12 NOTE — Chronic Care Management (AMB) (Signed)
Care Management Clinical Social Work Note  02/12/2021 Name: ZAINUB GANO MRN: [id number] DOB: Jan 27, 1981  [name] is a 40 y.o. year old female who is a primary care patient of Dorcas Carrow, DO.  The Care Management team was consulted for assistance with chronic disease management and coordination needs.  Engaged with patient by telephone for follow up visit in response to provider referral for social work chronic care management and care coordination services  Consent to Services:  Ms. Doberstein was given information about Care Management services today including:  Care Management services includes personalized support from designated clinical staff supervised by her physician, including individualized plan of care and coordination with other care providers 24/7 contact phone numbers for assistance for urgent and routine care needs. The patient may stop case management services at any time by phone call to the office staff.  Patient agreed to services and consent obtained.   Assessment: Patient is engaged in conversation, continues to maintain positive progress with care plan goals. Patient successfully identified barriers to management of stressors and has implemented strategies to improve mental and physical health conditions. Patient continues to receives strong support from family and friends. See Care Plan below for interventions and patient self-care actives. Recent life changes Efrain Sella: Management of health conditions Recommendation: Patient may benefit from, and is in agreement to work with LCSW to address care coordination needs and will continue to work with the clinical team to address health care and disease management related needs.  Follow up Plan: Patient would like continued follow-up from CCM LCSW .  Follow up scheduled in on 06/11/21. Patient will call office if needed prior to next encounter.  SDOH (Social Determinants of Health) assessments and interventions  performed:    Advanced Directives Status: Not addressed in this encounter.  Care Plan  No Known Allergies  Outpatient Encounter Medications as of 02/12/2021  Medication Sig   amLODipine (NORVASC) 5 MG tablet Take 1 tablet (5 mg total) by mouth daily.   metFORMIN (GLUCOPHAGE-XR) 500 MG 24 hr tablet Take 1 tablet (500 mg total) by mouth in the morning and at bedtime.   No facility-administered encounter medications on file as of 02/12/2021.    Patient Active Problem List   Diagnosis Date Noted   Essential hypertension 01/24/2020   Hyperlipidemia, mixed 02/23/2019   SOBOE (shortness of breath on exertion) 02/23/2019   Uncontrolled type 2 diabetes mellitus with hyperglycemia (HCC) 03/13/2018   Palpitations 04/15/2016   Morbid obesity (HCC) 02/28/2015    Conditions to be addressed/monitored:  Stress  Care Plan : General Social Work (Adult)  Updates made by Bridgett Larsson, LCSW since 02/12/2021 12:00 AM     Problem: Coping Skills (General Plan of Care)      Long-Range Goal: Coping Skills Enhanced   Start Date: 02/12/2021  This Visit's Progress: On track  Priority: Medium  Note:   Current barriers:   Acute Mental Health needs related to stress Clinical Goal(s): Over the next 120 days, patient will work with SW, counselor and therapist to reduce or manage symptoms of agitation, mood instability, stress, and bipolar until connected for ongoing counseling. Clinical Interventions:  Assessed patient's previous and current treatment, coping skills, support system and barriers to care  Patient interviewed and appropriate assessments performed Patient reports episodes of difficulty managing diabetes, in addition, to providing care to seven children, two with severe special needs Patient identified strong feelings of guilt when she is not meeting goals. States that her family  has been celebrating achievements and has been on vacation, which negatively impacted eating habits and blood  sugars CCM LCSW and Patient were able to identify habits of cognitive distortions (all or nothing thought patterns) that negatively impacts health. Patient states that she is aware "it's a lifelong journey and quick fixes aren't the answer. I'm finding other ways to celebrate, other than food" Patient has identified a plan to assist with management of health conditions (Whole 30 meal plan) Patient receives strong support from family and friends. She also has a strong spiritual relationship that provides patient with encouragement Commended patient on her transparency and advocating for self with providers Patient declines need for therapy or medication management interventions provided: Solution-Focused Strategies, Active listening / Reflection utilized , Emotional Supportive Provided, Psychoeducation for mental health needs , Caregiver stress acknowledged , and Verbalization of feelings encouraged  ; Collaboration with PCP regarding development and update of comprehensive plan of care as evidenced by provider attestation and co-signature Inter-disciplinary care team collaboration (see longitudinal plan of care) Patient Goals/Self-Care Activities: Over the next 120 days Attend all scheduled appointments with providers Avoid negative self-talk Practice relaxation or meditation daily Practice positive thinking and self-talk Contact office with any questions or concerns      Task: Support Psychosocial Response to Risk or Actual Health Condition        Jenel Lucks, MSW, LCSW [facility] Care Management [place]  [facility] [email or web address] Phone [phone] 9:50 AM

## 2021-02-12 NOTE — Patient Instructions (Signed)
Visit Information   Goals Addressed               This Visit's Progress     Patient Stated     SW- "I feel like I am very supported right now." (pt-stated)   On track     Patient Goals/Self-Care Activities: Over the next 120 days Attend all scheduled appointments with providers Avoid negative self-talk Practice relaxation or meditation daily Practice positive thinking and self-talk Contact office with any questions or concerns         Patient verbalizes understanding of instructions provided today and agrees to view in MyChart.   Telephone follow up appointment with care management team member scheduled for:06/11/21  Jenel Lucks, MSW, LCSW [facility] Care Management [facility]  [facility] [email or web address] Phone [phone] 9:53 AM

## 2021-03-11 ENCOUNTER — Ambulatory Visit: Payer: Self-pay | Admitting: General Practice

## 2021-03-11 ENCOUNTER — Telehealth: Payer: Self-pay | Admitting: General Practice

## 2021-03-11 DIAGNOSIS — I10 Essential (primary) hypertension: Secondary | ICD-10-CM

## 2021-03-11 DIAGNOSIS — E1165 Type 2 diabetes mellitus with hyperglycemia: Secondary | ICD-10-CM

## 2021-03-11 DIAGNOSIS — E782 Mixed hyperlipidemia: Secondary | ICD-10-CM

## 2021-03-11 NOTE — Chronic Care Management (AMB) (Signed)
Care Management    RN Visit Note  03/11/2021 Name: Bonnie Armstrong MRN: [id number] DOB: [birth year]  Subjective: Bonnie Armstrong is a 40 y.o. year old female who is a primary care patient of Bonnie Roys, DO. The care management team was consulted for assistance with disease management and care coordination needs.    Engaged with patient by telephone for follow up visit in response to provider referral for case management and/or care coordination services.   Consent to Services:   Ms. Bonnie Armstrong was given information about Care Management services today including:  Care Management services includes personalized support from designated clinical staff supervised by her physician, including individualized plan of care and coordination with other care providers 24/7 contact phone numbers for assistance for urgent and routine care needs. The patient may stop case management services at any time by phone call to the office staff.  Patient agreed to services and consent obtained.   Assessment: Review of patient past medical history, allergies, medications, health status, including review of consultants reports, laboratory and other test data, was performed as part of comprehensive evaluation and provision of chronic care management services.   SDOH (Social Determinants of Health) assessments and interventions performed:    Care Plan  No Known Allergies  Outpatient Encounter Medications as of 03/11/2021  Medication Sig   amLODipine (NORVASC) 5 MG tablet Take 1 tablet (5 mg total) by mouth daily.   metFORMIN (GLUCOPHAGE-XR) 500 MG 24 hr tablet Take 1 tablet (500 mg total) by mouth in the morning and at bedtime. (Patient not taking: Reported on 03/11/2021)   No facility-administered encounter medications on file as of 03/11/2021.    Patient Active Problem List   Diagnosis Date Noted   Essential hypertension 01/24/2020   Hyperlipidemia, mixed 02/23/2019   SOBOE (shortness of breath on  exertion) 02/23/2019   Uncontrolled type 2 diabetes mellitus with hyperglycemia ([place]) 03/13/2018   Palpitations 04/15/2016   Morbid obesity ([place]) 02/28/2015    Conditions to be addressed/monitored: HTN, HLD, and DMII  Care Plan : RNCM: HLD Plan of care  Updates made by Vanita Ingles since 03/11/2021 12:00 AM     Problem: RNCM: HLD Management   Priority: Medium     Long-Range Goal: RNCM: Management of HLD   Start Date: 10/15/2020  Expected End Date: 03/06/2022  This Visit's Progress: On track  Priority: Medium  Note:   Current Barriers:  Poorly controlled hyperlipidemia, complicated by HTN, DM2, Obesity Current antihyperlipidemic regimen: does not take statins due to breast feeding  Most recent lipid panel:     Component Value Date/Time   CHOL 180 07/17/2020 1012   TRIG 88 07/17/2020 1012   HDL 42 07/17/2020 1012   CHOLHDL 4.1 03/13/2018 1151   VLDL 25 04/21/2016 0001   LDLCALC 122 (H) 07/17/2020 1012   LDLCALC 136 (H) 03/13/2018 1151   ASCVD risk enhancing conditions: age 27,  DM, HTN Unable to independently manage HLD as evidence of not being able to take statins due to breast feeding.  RN Care Manager Clinical Goal(s):  patient will work with Consulting civil engineer, providers, and care team towards execution of optimized self-health management plan patient will verbalize understanding of plan for effective management of HLD patient will work with [facility] and pcp  to address needs related to management of HLD patient will take all medications exactly as prescribed and will call provider for medication related questions patient will attend all scheduled medical appointments: 03-20-2021 at 0900 am  patient will demonstrate improved health management independence the patient will demonstrate ongoing self health care management ability Interventions: Collaboration with Bonnie Roys, DO regarding development and update of comprehensive plan of care as evidenced by provider attestation  and co-signature Inter-disciplinary care team collaboration (see longitudinal plan of care) Medication review performed; medication list updated in electronic medical record.  Inter-disciplinary care team collaboration (see longitudinal plan of care) Referred to pharmacy team for assistance with HLD medication management Evaluation of current treatment plan related to HLD  and patient's adherence to plan as established by provider. Advised patient to follow a heart healthy/ADA diet and monitor fats and sodium in diet.  Provided education to patient re: healthy food options and maintaining balance in daily routines Reviewed scheduled/upcoming provider appointments including: 03-20-2021 at 0900 am Discussed plans with patient for ongoing care management follow up and provided patient with direct contact information for care management team Patient Goals/Self-Care Activities: - keep a list of all the medicines I take; vitamins and herbals too - learn to read medicine labels - use an alarm clock or phone to remind me to take my medicine - change to whole grain breads, cereal, pasta - drink 6 to 8 glasses of water each day - eat 3 to 5 servings of fruits and vegetables each day - eat 5 or 6 small meals each day - eat fish at least once per week - fill half the plate with nonstarchy vegetables - keep a food diary - limit fast food meals to no more than 1 per week - manage portion size - prepare main meal at home 3 to 5 days each week - read food labels for fat, fiber, carbohydrates and portion size - be open to making changes - I can manage, know and watch for signs of a heart attack - if I have chest pain, call for help - learn about small changes that will make a big difference - learn my personal risk factors  Follow Up Plan: Telephone follow up appointment with care management team member scheduled for: 06-03-2021 at 0900 am       Task: RNCM: HLD management Completed 03/11/2021   Outcome: Positive  Note:   Care Management Activities:    - collaboration with team encouraged - emotional support provided - empathic listening provided - reassurance provided - self-reliance encouraged - verbalization of feelings encouraged        Care Plan : RNCM: Diabetes Type 2 (Adult)  Updates made by Vanita Ingles since 03/11/2021 12:00 AM     Problem: RNCM: Glycemic Management (Diabetes, Type 2)   Priority: Medium     Long-Range Goal: RNCM: DM-Glycemic Management Optimized   Start Date: 10/15/2020  Expected End Date: 01/25/2022  This Visit's Progress: On track  Priority: Medium  Note:   Objective:  Lab Results  Component Value Date   HGBA1C 5.8 10/15/2020     Lab Results  Component Value Date   CREATININE 0.68 07/17/2020   CREATININE 0.59 04/17/2020   CREATININE 0.58 03/05/2020   No results found for: EGFR Current Barriers:  Knowledge Deficits related to basic Diabetes pathophysiology and self care/management Knowledge Deficits related to medications used for management of diabetes Unable to independently DM Case Manager Clinical Goal(s):  Over the next 120 days, patient will demonstrate improved adherence to prescribed treatment plan for diabetes self care/management as evidenced by:  daily monitoring and recording of CBG  adherence to ADA/ carb modified diet exercise 4/5 days/week adherence to prescribed  medication regimen Interventions:  Provided education to patient about basic DM disease process Reviewed medications with patient and discussed importance of medication adherence.  The patient is hoping at the next provider visit she can taper Metformin and get off of it. She is doing the Whole 30 program and is steadily losing weight. Her goal is to come off of the Metformin completely. 10-15-2020: The patient is in the office today to see pcp. She says her A1C may be off some because she has had some bad days but she is still wanting to try and get off of  the Metformin. Will discuss with Dr. Wynetta Emery. New A1C results pending. The patient states overall she is doing well. 12-24-2020: The patient is doing well and states her readings are around 120 to 130 consistently. She is hopeful her A1C will maintain and her goal is to come off of the Metformin completely. She is still maintaining a health diet and staying active.  03-11-2021: The patient is concerned that her A1C is going to be a little elevated. She rescheduled her appointment for a later date after coming off of the Metformin but she will see pcp next week. She states she got off of the Metformin in March and Dr. Wynetta Emery warned her about the rebound effect. For 2 months she struggled with food and blood sugars. She is back on track now and doing well. Still remains off of Metformin and wants to stay off of Metformin. Blood sugars this am were 123. She said I have hit the "reset button" and I want to do what I can to change outcomes for myself. I will work hard to achieve my goals.  Discussed plans with patient for ongoing care management follow up and provided patient with direct contact information for care management team Provided patient with written educational materials related to hypo and hyperglycemia and importance of correct treatment Reviewed scheduled/upcoming provider appointments including: 03-20-2021 at 0900 am Advised patient, providing education and rationale, to check cbg BID and record, calling pcp for findings outside established parameters.  10-15-2020: States she is not checking every day but feels it is about the same. She has had some fluctuations in weight but not drastic. She says that there has been several stressors impacting life in her home right now but she is staying focused. 12-24-2020: States her readings are around 120 to 130. Sometimes she has readings a little below 120 but for the most part it is consistently staying in 120's. 03-11-2021: States she is checking her blood sugars  daily right now so she will know her numbers. She has only been doing fasting. This am it was 123. The patient says fasting her numbers have been less than 130. Review of Fasting less than 130 and 2 hours after eating 180. Review of patient status, including review of consultants reports, relevant laboratory and other test results, and medications completed. Patient Goals/Self-Care Activities Over the next 120 days, patient will:  - UNABLE to independently manage DM Adheres to prescribed ADA/carb modified - barriers to adherence to treatment plan identified - blood glucose monitoring encouraged - blood glucose readings reviewed - resources required to improve adherence to care identified - self-awareness of signs/symptoms of hypo or hyperglycemia encouraged - use of blood glucose monitoring log promoted Follow Up Plan: Telephone follow up appointment with care management team member scheduled for: 06-03-2021 at 0900 am    Task: RNCM: Alleviate Barriers to Glycemic Management Completed 03/11/2021  Outcome: Positive  Note:  Care Management Activities:    - barriers to adherence to treatment plan identified - blood glucose monitoring encouraged - blood glucose readings reviewed - resources required to improve adherence to care identified - self-awareness of signs/symptoms of hypo or hyperglycemia encouraged - use of blood glucose monitoring log promoted         Plan: Telephone follow up appointment with care management team member scheduled for:  06-03-2021 at 0900 am  [place], MSN, [facility] Mobile: [phone]

## 2021-03-11 NOTE — Patient Instructions (Signed)
Visit Information  PATIENT GOALS:  Goals Addressed             This Visit's Progress    RNCM: Monitor and Manage My Blood Sugar-Diabetes Type 2       Timeframe:  Long-Range Goal Priority:  High Start Date:      03-11-2021                       Expected End Date:    03-11-2022                   Follow Up Date 06/03/2021    - check blood sugar at prescribed times - check blood sugar before and after exercise - check blood sugar if I feel it is too high or too low - take the blood sugar log to all doctor visits    Why is this important?   Checking your blood sugar at home helps to keep it from getting very high or very low.  Writing the results in a diary or log helps the doctor know how to care for you.  Your blood sugar log should have the time, date and the results.  Also, write down the amount of insulin or other medicine that you take.  Other information, like what you ate, exercise done and how you were feeling, will also be helpful.     Notes: 03-11-2021: The patient had a period of a couple of months where she was not doing well with her eating or managing her blood sugars. She has gotten back on track now and this am her blood sugar was 123 this am. She is doing the whole 30 plan just not eating fruits right now. She has not gotten to her goal weight of under 200 but she is doing better with blood sugars. She is concerned that her A1C is going to be elevated;  but she has been in contact with the pcp and will discuss options. She does not want to go back on the Metformin as she has been off of it since March. Empathetic listening and support. The LCSW has also been working with the patient. She is doing well and has the right mindset. Encouraged her to keep the tract that she is on and continue to be mindful of her food choices. She knows what it takes to keep her blood sugars under control.      RNCM: Set My Target A1C-Diabetes Type 2       Timeframe:  Short-Term Goal Priority:   High Start Date:    03-11-2021                         Expected End Date:       08-04-2021                Follow Up Date 06/03/2021    - set target A1C    Why is this important?   Your target A1C is decided together by you and your doctor.  It is based on several things like your age and other health issues.    Notes: The patient had a hemoglobin A1C of 5.85 in March of 2022 and was doing well. Had a couple of months where she did not do well. She came off of the Metformin and when she rebounded blood sugars were not good. She has gotten back on track and is doing well now. Blood sugar this  am was 123. She will follow up with pcp next week. She is still off of the Metformin and hopes to stay off of it. Will continue to monitor.         Patient verbalizes understanding of instructions provided today and agrees to view in MyChart.   Telephone follow up appointment with care management team member scheduled for:06-03-2021 at 0900 am  Alto [place] RN, MSN, CCM Community Care Coordinator [place]  [facility] Mobile: [phone]

## 2021-03-16 ENCOUNTER — Encounter: Payer: Self-pay | Admitting: Family Medicine

## 2021-03-20 ENCOUNTER — Ambulatory Visit: Payer: 59 | Admitting: Family Medicine

## 2021-04-20 ENCOUNTER — Other Ambulatory Visit: Payer: Self-pay | Admitting: Family Medicine

## 2021-04-21 NOTE — Telephone Encounter (Signed)
Requested medication (s) are due for refill today: yes  Requested medication (s) are on the active medication list: yes  Last refill:  metformin: 06/03/21    amlodipine: 07/17/20  Future visit scheduled: yes  Notes to clinic:  overdue f/u > 3 months   Requested Prescriptions  Pending Prescriptions Disp Refills   metFORMIN (GLUCOPHAGE-XR) 500 MG 24 hr tablet [Pharmacy Med Name: METFORMIN HCL ER 500 MG TABLET] 360 tablet 1    Sig: TAKE 2 TABLETS (1,000 MG TOTAL) BY MOUTH 2 (TWO) TIMES DAILY WITH A MEAL.     Endocrinology:  Diabetes - Biguanides Failed - 04/20/2021  1:26 AM      Failed - HBA1C is between 0 and 7.9 and within 180 days    HB A1C (BAYER DCA - WAIVED)  Date Value Ref Range Status  10/15/2020 5.8 <7.0 % Final    Comment:                                          Diabetic Adult            <7.0                                       Healthy Adult        4.3 - 5.7                                                           (DCCT/NGSP) American Diabetes Association's Summary of Glycemic Recommendations for Adults with Diabetes: Hemoglobin A1c <7.0%. More stringent glycemic goals (A1c <6.0%) may further reduce complications at the cost of increased risk of hypoglycemia.           Failed - Valid encounter within last 6 months    Recent Outpatient Visits           6 months ago Uncontrolled type 2 diabetes mellitus with hyperglycemia (HCC)   [facility] [place], Megan P, DO   9 months ago Uncontrolled type 2 diabetes mellitus with hyperglycemia [facility])   [facility] [place], Megan P, DO   1 year ago Uncontrolled type 2 diabetes mellitus with hyperglycemia [facility])   [facility] [place], Megan P, DO   1 year ago Routine general medical examination at a health care facility   [facility] [place] P, DO   1 year ago Uncontrolled type 2 diabetes mellitus with hyperglycemia [facility])   [facility] [place], [place], DO        Future Appointments             In 1 month Johnson, Megan P, DO [facility], [facility]            Passed - Cr in normal range and within 360 days    Creat  Date Value Ref Range Status  03/13/2018 0.61 0.50 - 1.10 mg/dL Final   Creatinine, Ser  Date Value Ref Range Status  07/17/2020 0.68 0.57 - 1.00 mg/dL Final          Passed - eGFR in normal range and within 360 days  GFR, Est African American  Date Value Ref Range Status  03/13/2018 132 > OR = 60 mL/min/1.59m2 Final   GFR calc Af Amer  Date Value Ref Range Status  07/17/2020 126 >59 mL/min/1.73 Final    Comment:    **In accordance with recommendations from the NKF-ASN Task force,**   [facility] is in the process of updating its eGFR calculation to the   2021 CKD-EPI creatinine equation that estimates kidney function   without a race variable.    GFR, Est Non African American  Date Value Ref Range Status  03/13/2018 114 > OR = 60 mL/min/1.61m2 Final   GFR calc non Af Amer  Date Value Ref Range Status  07/17/2020 109 >59 mL/min/1.73 Final           amLODipine (NORVASC) 5 MG tablet [Pharmacy Med Name: AMLODIPINE BESYLATE 5 MG TAB] 90 tablet 1    Sig: TAKE 1 TABLET BY MOUTH EVERY DAY     Cardiovascular:  Calcium Channel Blockers Failed - 04/20/2021  1:26 AM      Failed - Valid encounter within last 6 months    Recent Outpatient Visits           6 months ago Uncontrolled type 2 diabetes mellitus with hyperglycemia ([place])   [facility], Megan P, DO   9 months ago Uncontrolled type 2 diabetes mellitus with hyperglycemia ([place])   [place], Megan P, DO   1 year ago Uncontrolled type 2 diabetes mellitus with hyperglycemia ([place])   [place], [place] P, DO   1 year ago Routine general medical examination at a health care facility   [facility], [place], DO   1 year ago Uncontrolled type 2 diabetes mellitus with  hyperglycemia [facility])   [place], [place], DO       Future Appointments             In 1 month Johnson, Megan P, DO [place], [facility]            Passed - Last BP in normal range    BP Readings from Last 1 Encounters:  10/15/20 124/83

## 2021-04-21 NOTE — Telephone Encounter (Signed)
Patient has upcoming appointment next month.

## 2021-05-05 ENCOUNTER — Other Ambulatory Visit: Payer: Self-pay | Admitting: Family Medicine

## 2021-05-05 NOTE — Telephone Encounter (Signed)
Per pharmacy, pt did not pick up Rx from 04/21/21. #90 supply given

## 2021-06-03 ENCOUNTER — Ambulatory Visit: Payer: 59 | Admitting: Family Medicine

## 2021-06-03 ENCOUNTER — Telehealth: Payer: Self-pay

## 2021-06-11 ENCOUNTER — Telehealth: Payer: Self-pay

## 2021-07-16 ENCOUNTER — Ambulatory Visit: Payer: 59 | Admitting: Family Medicine

## 2021-07-16 ENCOUNTER — Encounter: Payer: 59 | Admitting: Family Medicine

## 2021-07-29 ENCOUNTER — Encounter: Payer: Self-pay | Admitting: Family Medicine

## 2021-07-29 ENCOUNTER — Other Ambulatory Visit: Payer: Self-pay

## 2021-07-29 ENCOUNTER — Ambulatory Visit: Payer: 59 | Admitting: Family Medicine

## 2021-07-29 VITALS — BP 136/84 | HR 87 | Temp 99.3°F | Ht 63.75 in | Wt 231.6 lb

## 2021-07-29 DIAGNOSIS — E782 Mixed hyperlipidemia: Secondary | ICD-10-CM

## 2021-07-29 DIAGNOSIS — I10 Essential (primary) hypertension: Secondary | ICD-10-CM | POA: Diagnosis not present

## 2021-07-29 DIAGNOSIS — R0602 Shortness of breath: Secondary | ICD-10-CM

## 2021-07-29 DIAGNOSIS — R002 Palpitations: Secondary | ICD-10-CM

## 2021-07-29 DIAGNOSIS — E1165 Type 2 diabetes mellitus with hyperglycemia: Secondary | ICD-10-CM | POA: Diagnosis not present

## 2021-07-29 MED ORDER — OZEMPIC (0.25 OR 0.5 MG/DOSE) 2 MG/1.5ML ~~LOC~~ SOPN: 0.5000 mg | PEN_INJECTOR | SUBCUTANEOUS | 1 refills | Status: DC

## 2021-07-29 NOTE — Progress Notes (Signed)
BP 136/84    Pulse 87    Temp 99.3 F (37.4 C)    Ht 5' 3.75" (1.619 m)    Wt 231 lb 9.6 oz (105.1 kg)    SpO2 99%    BMI 40.07 kg/m    Subjective:    Patient ID: Bonnie Armstrong, female    DOB: Jul 23, 1981, 40 y.o.   MRN: [id number]  HPI: Bonnie Armstrong is a 41 y.o. female  Chief Complaint  Patient presents with   Obesity   Diabetes   OBESITY Duration: chronic Previous attempts at weight loss: yes Complications of obesity: DM, HTN, HLD Peak weight: current (231) Weight loss goal: to be healthy Weight loss to date: none Requesting obesity pharmacotherapy: no Current weight loss supplements/medications: no Previous weight loss supplements/meds: no  DIABETES Hypoglycemic episodes:no Polydipsia/polyuria: yes Visual disturbance: no Chest pain: yes Paresthesias: no Glucose Monitoring: no Taking Insulin?: no Blood Pressure Monitoring: not checking Retinal Examination: Not up to Date Foot Exam: Not up to Date Diabetic Education: Completed Pneumovax: Up to Date Influenza:  Declined Aspirin: no  HYPERTENSION / HYPERLIPIDEMIA Satisfied with current treatment? yes Duration of hypertension: chronic BP monitoring frequency: not checking BP medication side effects: no Past BP meds: amlodipine Duration of hyperlipidemia: chronic Cholesterol medication side effects: not on anything Cholesterol supplements: none Past cholesterol medications: none Medication compliance: good compliance Aspirin: no Recent stressors: yes Recurrent headaches: no Visual changes: no Palpitations: yes Dyspnea: no Chest pain: yes Lower extremity edema: no Dizzy/lightheaded: no   Relevant past medical, surgical, family and social history reviewed and updated as indicated. Interim medical history since our last visit reviewed. Allergies and medications reviewed and updated.  Review of Systems  Constitutional: Negative.   Respiratory: Negative.    Cardiovascular: Negative.    Musculoskeletal: Negative.   Neurological: Negative.   Psychiatric/Behavioral: Negative.     Per HPI unless specifically indicated above     Objective:    BP 136/84    Pulse 87    Temp 99.3 F (37.4 C)    Ht 5' 3.75" (1.619 m)    Wt 231 lb 9.6 oz (105.1 kg)    SpO2 99%    BMI 40.07 kg/m   Wt Readings from Last 3 Encounters:  07/29/21 231 lb 9.6 oz (105.1 kg)  10/15/20 215 lb 9.6 oz (97.8 kg)  07/17/20 216 lb 12.8 oz (98.3 kg)    Physical Exam Vitals and nursing note reviewed.  Constitutional:      General: She is not in acute distress.    Appearance: Normal appearance. She is obese. She is not ill-appearing, toxic-appearing or diaphoretic.  HENT:     Head: Normocephalic and atraumatic.     Right Ear: External ear normal.     Left Ear: External ear normal.     Nose: Nose normal.     Mouth/Throat:     Mouth: Mucous membranes are moist.     Pharynx: Oropharynx is clear.  Eyes:     General: No scleral icterus.       Right eye: No discharge.        Left eye: No discharge.     Extraocular Movements: Extraocular movements intact.     Conjunctiva/sclera: Conjunctivae normal.     Pupils: Pupils are equal, round, and reactive to light.  Cardiovascular:     Rate and Rhythm: Normal rate and regular rhythm.     Pulses: Normal pulses.     Heart sounds: Normal heart sounds. No  murmur heard.   No friction rub. No gallop.  Pulmonary:     Effort: Pulmonary effort is normal. No respiratory distress.     Breath sounds: Normal breath sounds. No stridor. No wheezing, rhonchi or rales.  Chest:     Chest wall: No tenderness.  Musculoskeletal:        General: Normal range of motion.     Cervical back: Normal range of motion and neck supple.  Skin:    General: Skin is warm and dry.     Capillary Refill: Capillary refill takes less than 2 seconds.     Coloration: Skin is not jaundiced or pale.     Findings: No bruising, erythema, lesion or rash.  Neurological:     General: No focal  deficit present.     Mental Status: She is alert and oriented to person, place, and time. Mental status is at baseline.  Psychiatric:        Mood and Affect: Mood normal.        Behavior: Behavior normal.        Thought Content: Thought content normal.        Judgment: Judgment normal.    Results for orders placed or performed in visit on 07/29/21  Comprehensive metabolic panel  Result Value Ref Range   Glucose 211 (H) 70 - 99 mg/dL   BUN 9 6 - 24 mg/dL   Creatinine, Ser 0.60 0.57 - 1.00 mg/dL   eGFR 115 >59 mL/min/1.73   BUN/Creatinine Ratio 15 9 - 23   Sodium 138 134 - 144 mmol/L   Potassium 4.0 3.5 - 5.2 mmol/L   Chloride 99 96 - 106 mmol/L   CO2 24 20 - 29 mmol/L   Calcium 9.1 8.7 - 10.2 mg/dL   Total Protein 7.2 6.0 - 8.5 g/dL   Albumin 4.5 3.8 - 4.8 g/dL   Globulin, Total 2.7 1.5 - 4.5 g/dL   Albumin/Globulin Ratio 1.7 1.2 - 2.2   Bilirubin Total 0.3 0.0 - 1.2 mg/dL   Alkaline Phosphatase 72 44 - 121 IU/L   AST 13 0 - 40 IU/L   ALT 18 0 - 32 IU/L  CBC with Differential/Platelet  Result Value Ref Range   WBC 7.2 3.4 - 10.8 x10E3/uL   RBC 5.22 3.77 - 5.28 x10E6/uL   Hemoglobin 14.3 11.1 - 15.9 g/dL   Hematocrit 44.3 34.0 - 46.6 %   MCV 85 79 - 97 fL   MCH 27.4 26.6 - 33.0 pg   MCHC 32.3 31.5 - 35.7 g/dL   RDW 12.9 11.7 - 15.4 %   Platelets 306 150 - 450 x10E3/uL   Neutrophils 63 Not Estab. %   Lymphs 29 Not Estab. %   Monocytes 6 Not Estab. %   Eos 1 Not Estab. %   Basos 1 Not Estab. %   Neutrophils Absolute 4.5 1.4 - 7.0 x10E3/uL   Lymphocytes Absolute 2.1 0.7 - 3.1 x10E3/uL   Monocytes Absolute 0.4 0.1 - 0.9 x10E3/uL   EOS (ABSOLUTE) 0.1 0.0 - 0.4 x10E3/uL   Basophils Absolute 0.0 0.0 - 0.2 x10E3/uL   Immature Granulocytes 0 Not Estab. %   Immature Grans (Abs) 0.0 0.0 - 0.1 x10E3/uL  Hgb A1c w/o eAG  Result Value Ref Range   Hgb A1c MFr Bld 9.6 (H) 4.8 - 5.6 %  Lipid Panel w/o Chol/HDL Ratio  Result Value Ref Range   Cholesterol, Total 236 (H) 100 - 199  mg/dL   Triglycerides 158 (H) 0 -  149 mg/dL   HDL 52 >39 mg/dL   VLDL Cholesterol Cal 29 5 - 40 mg/dL   LDL Chol Calc (NIH) 155 (H) 0 - 99 mg/dL  TSH  Result Value Ref Range   TSH 0.966 0.450 - 4.500 uIU/mL      Assessment & Plan:   Problem List Items Addressed This Visit       Cardiovascular and Mediastinum   Essential hypertension    Under good control on current regimen. Continue current regimen. Continue to monitor. Call with any concerns. Refills given. Labs drawn today.        Relevant Medications   amLODipine (NORVASC) 5 MG tablet   Other Relevant Orders   Comprehensive metabolic panel (Completed)   CBC with Differential/Platelet (Completed)   Hgb A1c w/o eAG (Completed)   Lipid Panel w/o Chol/HDL Ratio (Completed)   TSH (Completed)   Urine Microalbumin w/creat. ratio     Endocrine   Uncontrolled type 2 diabetes mellitus with hyperglycemia ([place]) - Primary    Has not been taking her medicine. Will start her on ozempic and recheck 2 months. Call with any concerns. Continue to monitor.       Relevant Medications   Semaglutide,0.25 or 0.5MG /DOS, (OZEMPIC, 0.25 OR 0.5 MG/DOSE,) 2 MG/1.5ML SOPN   Other Relevant Orders   Comprehensive metabolic panel (Completed)   CBC with Differential/Platelet (Completed)   Hgb A1c w/o eAG (Completed)   Lipid Panel w/o Chol/HDL Ratio (Completed)   TSH (Completed)   Urine Microalbumin w/creat. ratio     Other   Morbid obesity ([place])    Would like to see bariatric surgery. Referral placed today. Encouraged diet and exercise. Continue to monitor.       Relevant Medications   Semaglutide,0.25 or 0.5MG /DOS, (OZEMPIC, 0.25 OR 0.5 MG/DOSE,) 2 MG/1.5ML SOPN   Other Relevant Orders   Amb Referral to Bariatric Surgery   Palpitations    Referral back to cardiology. Await their input. Call with any concerns.       Relevant Orders   Ambulatory referral to Cardiology   Hyperlipidemia, mixed    Rechecking labs today. Await results.  Treat as needed.       Relevant Medications   amLODipine (NORVASC) 5 MG tablet   Other Relevant Orders   Comprehensive metabolic panel (Completed)   CBC with Differential/Platelet (Completed)   Hgb A1c w/o eAG (Completed)   Lipid Panel w/o Chol/HDL Ratio (Completed)   TSH (Completed)   Urine Microalbumin w/creat. ratio   SOBOE (shortness of breath on exertion)    Referral back to cardiology. Await their input. Call with any concerns.         Follow up plan: Return in about 2 months (around 09/29/2021).

## 2021-07-30 LAB — COMPREHENSIVE METABOLIC PANEL
ALT: 18 IU/L (ref 0–32)
AST: 13 IU/L (ref 0–40)
Albumin/Globulin Ratio: 1.7 (ref 1.2–2.2)
Albumin: 4.5 g/dL (ref 3.8–4.8)
Alkaline Phosphatase: 72 IU/L (ref 44–121)
BUN/Creatinine Ratio: 15 (ref 9–23)
BUN: 9 mg/dL (ref 6–24)
Bilirubin Total: 0.3 mg/dL (ref 0.0–1.2)
CO2: 24 mmol/L (ref 20–29)
Calcium: 9.1 mg/dL (ref 8.7–10.2)
Chloride: 99 mmol/L (ref 96–106)
Creatinine, Ser: 0.6 mg/dL (ref 0.57–1.00)
Globulin, Total: 2.7 g/dL (ref 1.5–4.5)
Glucose: 211 mg/dL — ABNORMAL HIGH (ref 70–99)
Potassium: 4 mmol/L (ref 3.5–5.2)
Sodium: 138 mmol/L (ref 134–144)
Total Protein: 7.2 g/dL (ref 6.0–8.5)
eGFR: 115 mL/min/{1.73_m2} (ref >59)

## 2021-07-30 LAB — LIPID PANEL W/O CHOL/HDL RATIO
Cholesterol, Total: 236 mg/dL — ABNORMAL HIGH (ref 100–199)
HDL: 52 mg/dL (ref >39)
LDL Chol Calc (NIH): 155 mg/dL — ABNORMAL HIGH (ref 0–99)
Triglycerides: 158 mg/dL — ABNORMAL HIGH (ref 0–149)
VLDL Cholesterol Cal: 29 mg/dL (ref 5–40)

## 2021-07-30 LAB — TSH: TSH: 0.966 u[IU]/mL (ref 0.450–4.500)

## 2021-07-30 LAB — CBC WITH DIFFERENTIAL/PLATELET
Basophils Absolute: 0 10*3/uL (ref 0.0–0.2)
Basos: 1 %
EOS (ABSOLUTE): 0.1 10*3/uL (ref 0.0–0.4)
Eos: 1 %
Hematocrit: 44.3 % (ref 34.0–46.6)
Hemoglobin: 14.3 g/dL (ref 11.1–15.9)
Immature Grans (Abs): 0 10*3/uL (ref 0.0–0.1)
Immature Granulocytes: 0 %
Lymphocytes Absolute: 2.1 10*3/uL (ref 0.7–3.1)
Lymphs: 29 %
MCH: 27.4 pg (ref 26.6–33.0)
MCHC: 32.3 g/dL (ref 31.5–35.7)
MCV: 85 fL (ref 79–97)
Monocytes Absolute: 0.4 10*3/uL (ref 0.1–0.9)
Monocytes: 6 %
Neutrophils Absolute: 4.5 10*3/uL (ref 1.4–7.0)
Neutrophils: 63 %
Platelets: 306 10*3/uL (ref 150–450)
RBC: 5.22 x10E6/uL (ref 3.77–5.28)
RDW: 12.9 % (ref 11.7–15.4)
WBC: 7.2 10*3/uL (ref 3.4–10.8)

## 2021-07-30 LAB — HGB A1C W/O EAG: Hgb A1c MFr Bld: 9.6 % — ABNORMAL HIGH (ref 4.8–5.6)

## 2021-07-30 LAB — MICROALBUMIN / CREATININE URINE RATIO
Creatinine, Urine: 231.6 mg/dL
Microalb/Creat Ratio: 14 mg/g creat (ref 0–29)
Microalbumin, Urine: 33.4 ug/mL

## 2021-07-30 MED ORDER — AMLODIPINE BESYLATE 5 MG PO TABS: 5.0000 mg | ORAL_TABLET | Freq: Every day | ORAL | 1 refills | Status: DC

## 2021-07-30 NOTE — Assessment & Plan Note (Signed)
Would like to see bariatric surgery. Referral placed today. Encouraged diet and exercise. Continue to monitor.

## 2021-07-30 NOTE — Assessment & Plan Note (Signed)
Referral back to cardiology. Await their input. Call with any concerns.

## 2021-07-30 NOTE — Assessment & Plan Note (Signed)
Has not been taking her medicine. Will start her on ozempic and recheck 2 months. Call with any concerns. Continue to monitor.

## 2021-07-30 NOTE — Assessment & Plan Note (Signed)
Rechecking labs today. Await results. Treat as needed.  °

## 2021-07-30 NOTE — Assessment & Plan Note (Signed)
Under good control on current regimen. Continue current regimen. Continue to monitor. Call with any concerns. Refills given. Labs drawn today.   

## 2021-08-13 ENCOUNTER — Encounter: Payer: Self-pay | Admitting: Cardiology

## 2021-08-13 ENCOUNTER — Other Ambulatory Visit: Payer: Self-pay

## 2021-08-13 ENCOUNTER — Ambulatory Visit (INDEPENDENT_AMBULATORY_CARE_PROVIDER_SITE_OTHER): Payer: 59 | Admitting: Cardiology

## 2021-08-13 ENCOUNTER — Ambulatory Visit (INDEPENDENT_AMBULATORY_CARE_PROVIDER_SITE_OTHER): Payer: 59

## 2021-08-13 VITALS — BP 140/90 | HR 105 | Ht 64.0 in | Wt 230.5 lb

## 2021-08-13 DIAGNOSIS — R002 Palpitations: Secondary | ICD-10-CM

## 2021-08-13 DIAGNOSIS — I10 Essential (primary) hypertension: Secondary | ICD-10-CM | POA: Diagnosis not present

## 2021-08-13 DIAGNOSIS — R072 Precordial pain: Secondary | ICD-10-CM

## 2021-08-13 DIAGNOSIS — R0602 Shortness of breath: Secondary | ICD-10-CM | POA: Diagnosis not present

## 2021-08-13 NOTE — Progress Notes (Signed)
Cardiology Office Note:    Date:  08/13/2021   ID:  [name], DOB 31-Aug-1980, MRN [id number]  PCP:  Dorcas Carrow, DO   [facility] Providers Cardiologist:  None     Referring MD: Dorcas Carrow, DO   Chief Complaint  Patient presents with   New Patient (Initial Visit)    Patient c/o palpitations off and on with new symptoms of chest tightness that radiates into her neck and shoulder blades with the pain running down her left arm. Patient has experienced palpitations since 2020 but seems to be getting worse. Medications reviewed by the patient verbally.    MAXINE FREDMAN is a 40 y.o. female who is being seen today for the evaluation of palpitations and chest pain at the request of Dorcas Carrow, DO.   History of Present Illness:    AKOSUA CONSTANTINE is a 40 y.o. female with a hx of hypertension, hyperlipidemia, obesity who presents due to palpitations and chest pain.  She has symptoms of palpitations ongoing over the past 2 years.  Also endorses chest pain not related with exertion which of course sometimes without palpitations.  Chest pain is located around her left breast and sometimes go to her back.  Father had an MI in his 38s to 59s.  Patient also endorses being more short of breath of late.  She is not sure if she snores, but she states not having good sleep nightly due to taking care of her autistic child.  Her recent cholesterol levels were elevated, patient was restarted on Ozempic for diabetes control, primary care physician plans to start statin after patient was suggested to Ozempic.  Past Medical History:  Diagnosis Date   Gestational diabetes    IFG (impaired fasting glucose)    Morbid obesity ([facility]) 02/28/2015   Prediabetes     Past Surgical History:  Procedure Laterality Date   CESAREAN SECTION  2002   WISDOM TOOTH EXTRACTION      Current Medications: Current Meds  Medication Sig   amLODipine (NORVASC) 5 MG tablet Take 1 tablet (5 mg  total) by mouth daily.   Semaglutide,0.25 or 0.5MG /DOS, (OZEMPIC, 0.25 OR 0.5 MG/DOSE,) 2 MG/1.5ML SOPN Inject 0.5 mg into the skin once a week for 28 days.     Allergies:   Patient has no known allergies.   Social History   Socioeconomic History   Marital status: Married    Spouse name: Not on file   Number of children: Not on file   Years of education: Not on file   Highest education level: Not on file  Occupational History   Not on file  Tobacco Use   Smoking status: Never   Smokeless tobacco: Never  Vaping Use   Vaping Use: Never used  Substance and Sexual Activity   Alcohol use: No   Drug use: No   Sexual activity: Yes    Birth control/protection: None  Other Topics Concern   Not on file  Social History Narrative   Not on file   Social Determinants of Health   Financial Resource Strain: Not on file  Food Insecurity: Not on file  Transportation Needs: Not on file  Physical Activity: Not on file  Stress: Not on file  Social Connections: Not on file     Family History: The patient's family history includes CAD in her father; COPD in her paternal grandmother; Cancer in her paternal grandfather; Diabetes in her maternal grandfather; Heart disease in her father;  Hyperlipidemia in her mother; Hypertension in her mother; Hypothyroidism in her mother.  ROS:   Please see the history of present illness.     All other systems reviewed and are negative.  EKGs/Labs/Other Studies Reviewed:    The following studies were reviewed today:   EKG:  EKG is  ordered today.  The ekg ordered today demonstrates sinus tachycardia heart rate 109  Recent Labs: 07/29/2021: ALT 18; BUN 9; Creatinine, Ser 0.60; Hemoglobin 14.3; Platelets 306; Potassium 4.0; Sodium 138; TSH 0.966  Recent Lipid Panel    Component Value Date/Time   CHOL 236 (H) 07/29/2021 0959   TRIG 158 (H) 07/29/2021 0959   HDL 52 07/29/2021 0959   CHOLHDL 4.1 03/13/2018 1151   VLDL 25 04/21/2016 0001   LDLCALC  155 (H) 07/29/2021 0959   LDLCALC 136 (H) 03/13/2018 1151     Risk Assessment/Calculations:          Physical Exam:    VS:  BP 140/90 (BP Location: Right Arm, Patient Position: Sitting, Cuff Size: Normal)    Pulse (!) 105    Ht  (1.626 m)    Wt 230 lb 8 oz (104.6 kg)    SpO2 99%    BMI 39.57 kg/m     Wt Readings from Last 3 Encounters:  08/13/21 230 lb 8 oz (104.6 kg)  07/29/21 231 lb 9.6 oz (105.1 kg)  10/15/20 215 lb 9.6 oz (97.8 kg)     GEN:  Well nourished, well developed in no acute distress HEENT: Normal NECK: No JVD; No carotid bruits LYMPHATICS: No lymphadenopathy CARDIAC: RRR, no murmurs, rubs, gallops RESPIRATORY:  Clear to auscultation without rales, wheezing or rhonchi  ABDOMEN: Soft, non-tender, non-distended MUSCULOSKELETAL:  No edema; No deformity  SKIN: Warm and dry NEUROLOGIC:  Alert and oriented x 3 PSYCHIATRIC:  Normal affect   ASSESSMENT:    1. Palpitations   2. Precordial pain   3. Primary hypertension   4. Shortness of breath    PLAN:    In order of problems listed above:  Palpitations, placed 2-week cardiac monitor to evaluate any significant arrhythmias. Chest pain, risk factors hypertension, hyperlipidemia, diabetes, obesity.  Get echocardiogram, get Lexiscan Myoview to evaluate any ischemia. Hypertension, BP usually controlled at home with systolic in the 130s.  Continue to monitor for now.  Continue amlodipine 5 mg daily.  Patient will need an ARB/ACE inhibitor due to being a diabetic and also hypertensive.  Consider starting at follow-up visit. Shortness of breath, this could be secondary to obesity/deconditioning versus possible OSA.  Echo and Myoview as above.  If no structural abnormalities, consider sleep study. Hyperlipidemia, currently being managed by primary care provider.  Recommend starting statin as per [facility] AHA guidelines due to patient being diabetic and patient's age.  Follow-up in 2 months.      Shared Decision  Making/Informed Consent The risks [chest pain, shortness of breath, cardiac arrhythmias, dizziness, blood pressure fluctuations, myocardial infarction, stroke/transient ischemic attack, nausea, vomiting, allergic reaction, radiation exposure, metallic taste sensation and life-threatening complications (estimated to be 1 in 10,000)], benefits (risk stratification, diagnosing coronary artery disease, treatment guidance) and alternatives of a nuclear stress test were discussed in detail with Ms. Jablon and she agrees to proceed.    Medication Adjustments/Labs and Tests Ordered: Current medicines are reviewed at length with the patient today.  Concerns regarding medicines are outlined above.  Orders Placed This Encounter  Procedures   NM Myocar Multi W/Spect W/Wall Motion / EF  Cardiac Stress Test: Informed Consent Details: Physician/Practitioner Attestation; Transcribe to consent form and obtain patient signature   LONG TERM MONITOR (3-14 DAYS)   EKG 12-Lead   ECHOCARDIOGRAM COMPLETE   No orders of the defined types were placed in this encounter.   Patient Instructions  Medication Instructions:   Your physician recommends that you continue on your current medications as directed. Please refer to the Current Medication list given to you today.   *If you need a refill on your cardiac medications before your next appointment, please call your pharmacy*   Lab Work:  None ordered    Testing/Procedures:  Your physician has requested that you have an echocardiogram. Echocardiography is a painless test that uses sound waves to create images of your heart. It provides your doctor with information about the size and shape of your heart and how well your hearts chambers and valves are working. This procedure takes approximately one hour. There are no restrictions for this procedure.   2.   [facility] MYOVIEW      Your caregiver has ordered a Stress Test with nuclear imaging. The purpose of this  test is to evaluate the blood supply to your heart muscle. This procedure is referred to as a "Non-Invasive Stress Test." This is because other than having an IV started in your vein, nothing is inserted or "invades" your body. Cardiac stress tests are done to find areas of poor blood flow to the heart by determining the extent of coronary artery disease (CAD). Some patients exercise on a treadmill, which naturally increases the blood flow to your heart, while others who are  unable to walk on a treadmill due to physical limitations have a pharmacologic/chemical stress agent called Lexiscan . This medicine will mimic walking on a treadmill by temporarily increasing your coronary blood flow.      PLEASE REPORT TO [facility] MEDICAL MALL ENTRANCE   THE VOLUNTEERS AT THE FIRST DESK WILL DIRECT YOU WHERE TO GO     *Please note: these test may take anywhere between 2-4 hours to complete       Date of Procedure:_____________________________________   Arrival Time for Procedure:______________________________    PLEASE NOTIFY THE OFFICE AT LEAST 24 HOURS IN ADVANCE IF YOU ARE UNABLE TO KEEP YOUR APPOINTMENT.  [phone]  PLEASE NOTIFY NUCLEAR MEDICINE AT [facility] AT LEAST 24 HOURS IN ADVANCE IF YOU ARE UNABLE TO KEEP YOUR APPOINTMENT. [phone]      How to prepare for your Myoview test:       1. Do not eat or drink after midnight  2. No caffeine for 24 hours prior to test  3. No smoking 24 hours prior to test.  4. Unless instructed otherwise, Take your medication with a small sips of water.    5.         Ladies, please do not wear dresses. Skirts or pants are appropriate. Please wear a short sleeve shirt.  6. No perfume, cologne or lotion.  7. Wear comfortable walking shoes. No heels!    3.  Your physician has recommended that you wear a Zio monitor for 2 weeks, this will be delivered to your home.   This monitor is a medical device that records the hearts electrical activity. Doctors most  often use these monitors to diagnose arrhythmias. Arrhythmias are problems with the speed or rhythm of the heartbeat. The monitor is a small device applied to your chest. You can wear one while you do your normal daily activities. While wearing  this monitor if you have any symptoms to push the button and record what you felt. Once you have worn this monitor for the period of time provider prescribed (Usually 14 days), you will return the monitor device in the postage paid box. Once it is returned they will download the data collected and provide [place] with a report which the provider will then review and we will call you with those results. Important tips:  Avoid showering during the first 24 hours of wearing the monitor. Avoid excessive sweating to help maximize wear time. Do not submerge the device, no hot tubs, and no swimming pools. Keep any lotions or oils away from the patch. After 24 hours you may shower with the patch on. Take brief showers with your back facing the shower head.  Do not remove patch once it has been placed because that will interrupt data and decrease adhesive wear time. Push the button when you have any symptoms and write down what you were feeling. Once you have completed wearing your monitor, remove and place into box which has postage paid and place in your outgoing mailbox.  If for some reason you have misplaced your box then call our office and we can provide another box and/or mail it off for you.      Follow-Up: At [facility], you and your health needs are our priority.  As part of our continuing mission to provide you with exceptional heart care, we have created designated Provider Care Teams.  These Care Teams include your primary Cardiologist (physician) and Advanced Practice Providers (APPs -  Physician Assistants and Nurse Practitioners) who all work together to provide you with the care you need, when you need it.  We recommend signing up for the patient  portal called "MyChart".  Sign up information is provided on this After Visit Summary.  MyChart is used to connect with patients for Virtual Visits (Telemedicine).  Patients are able to view lab/test results, encounter notes, upcoming appointments, etc.  Non-urgent messages can be sent to your provider as well.   To learn more about what you can do with MyChart, go to [email or web address].    Your next appointment:   2 month(s)  The format for your next appointment:   In Person  Provider:   You may see Debbe Odea, MD or one of the following Advanced Practice Providers on your designated Care Team:   Nicolasa Ducking, NP Eula Listen, PA-C Cadence Fransico Michael, [place]    Other Instructions     Signed, Debbe Odea, MD  08/13/2021 10:26 AM     [facility]

## 2021-08-13 NOTE — Patient Instructions (Addendum)
Medication Instructions:   Your physician recommends that you continue on your current medications as directed. Please refer to the Current Medication list given to you today.   *If you need a refill on your cardiac medications before your next appointment, please call your pharmacy*   Lab Work:  None ordered    Testing/Procedures:  Your physician has requested that you have an echocardiogram. Echocardiography is a painless test that uses sound waves to create images of your heart. It provides your doctor with information about the size and shape of your heart and how well your hearts chambers and valves are working. This procedure takes approximately one hour. There are no restrictions for this procedure.   2.   [facility] MYOVIEW      Your caregiver has ordered a Stress Test with nuclear imaging. The purpose of this test is to evaluate the blood supply to your heart muscle. This procedure is referred to as a "Non-Invasive Stress Test." This is because other than having an IV started in your vein, nothing is inserted or "invades" your body. Cardiac stress tests are done to find areas of poor blood flow to the heart by determining the extent of coronary artery disease (CAD). Some patients exercise on a treadmill, which naturally increases the blood flow to your heart, while others who are  unable to walk on a treadmill due to physical limitations have a pharmacologic/chemical stress agent called Lexiscan . This medicine will mimic walking on a treadmill by temporarily increasing your coronary blood flow.      PLEASE REPORT TO [facility] MEDICAL MALL ENTRANCE   THE VOLUNTEERS AT THE FIRST DESK WILL DIRECT YOU WHERE TO GO     *Please note: these test may take anywhere between 2-4 hours to complete       Date of Procedure:_____________________________________   Arrival Time for Procedure:______________________________    PLEASE NOTIFY THE OFFICE AT LEAST 24 HOURS IN ADVANCE IF YOU ARE UNABLE  TO KEEP YOUR APPOINTMENT.  [phone]  PLEASE NOTIFY NUCLEAR MEDICINE AT [facility] AT LEAST 24 HOURS IN ADVANCE IF YOU ARE UNABLE TO KEEP YOUR APPOINTMENT. [phone]      How to prepare for your Myoview test:       1. Do not eat or drink after midnight  2. No caffeine for 24 hours prior to test  3. No smoking 24 hours prior to test.  4. Unless instructed otherwise, Take your medication with a small sips of water.    5.         Ladies, please do not wear dresses. Skirts or pants are appropriate. Please wear a short sleeve shirt.  6. No perfume, cologne or lotion.  7. Wear comfortable walking shoes. No heels!    3.  Your physician has recommended that you wear a Zio monitor for 2 weeks, this will be delivered to your home.   This monitor is a medical device that records the hearts electrical activity. Doctors most often use these monitors to diagnose arrhythmias. Arrhythmias are problems with the speed or rhythm of the heartbeat. The monitor is a small device applied to your chest. You can wear one while you do your normal daily activities. While wearing this monitor if you have any symptoms to push the button and record what you felt. Once you have worn this monitor for the period of time provider prescribed (Usually 14 days), you will return the monitor device in the postage paid box. Once it is returned they will download  the data collected and provide [place] with a report which the provider will then review and we will call you with those results. Important tips:  Avoid showering during the first 24 hours of wearing the monitor. Avoid excessive sweating to help maximize wear time. Do not submerge the device, no hot tubs, and no swimming pools. Keep any lotions or oils away from the patch. After 24 hours you may shower with the patch on. Take brief showers with your back facing the shower head.  Do not remove patch once it has been placed because that will interrupt data and decrease  adhesive wear time. Push the button when you have any symptoms and write down what you were feeling. Once you have completed wearing your monitor, remove and place into box which has postage paid and place in your outgoing mailbox.  If for some reason you have misplaced your box then call our office and we can provide another box and/or mail it off for you.      Follow-Up: At [facility], you and your health needs are our priority.  As part of our continuing mission to provide you with exceptional heart care, we have created designated Provider Care Teams.  These Care Teams include your primary Cardiologist (physician) and Advanced Practice Providers (APPs -  Physician Assistants and Nurse Practitioners) who all work together to provide you with the care you need, when you need it.  We recommend signing up for the patient portal called "MyChart".  Sign up information is provided on this After Visit Summary.  MyChart is used to connect with patients for Virtual Visits (Telemedicine).  Patients are able to view lab/test results, encounter notes, upcoming appointments, etc.  Non-urgent messages can be sent to your provider as well.   To learn more about what you can do with MyChart, go to [email or web address].    Your next appointment:   2 month(s)  The format for your next appointment:   In Person  Provider:   You may see Debbe Odea, MD or one of the following Advanced Practice Providers on your designated Care Team:   Nicolasa Ducking, NP Eula Listen, PA-C Cadence Fransico Michael, [place]    Other Instructions

## 2021-08-16 DIAGNOSIS — R002 Palpitations: Secondary | ICD-10-CM | POA: Diagnosis not present

## 2021-08-19 ENCOUNTER — Ambulatory Visit: Payer: 59

## 2021-08-25 ENCOUNTER — Telehealth: Payer: Self-pay

## 2021-08-25 NOTE — Telephone Encounter (Signed)
Noted  

## 2021-08-25 NOTE — Telephone Encounter (Signed)
Echo not available to coordinate with [facility] 1/18 .  Will fu if able to call from waitlist with a closer together time.  Otherwise , patient aware and is ok to keep both appts

## 2021-08-25 NOTE — Telephone Encounter (Signed)
Patient calling.  Wanting to know if she can move her ECHO to the same day as her stress test and have it preformed over in the hospital.   She stated she has 7 kids -- 2 that are very special needs so if she could get all the testing done in one day she stated that would be so much easier on her.  Stress test is scheduled for 1/18 in the AM.  I made her aware that cost may be a little different -- she is okay with this.   She stated she has [employer] -- which is new.    Patient would like a call back to confirm that her ECHO is scheduled for 01/18 after her stress test.

## 2021-08-26 ENCOUNTER — Ambulatory Visit: Payer: 59

## 2021-08-31 ENCOUNTER — Telehealth: Payer: Self-pay

## 2021-08-31 DIAGNOSIS — R072 Precordial pain: Secondary | ICD-10-CM

## 2021-08-31 NOTE — Telephone Encounter (Signed)
Ordered CCTA and sent a message to pre-cert to inquire if this testing would be approved  before reaching out to patient.

## 2021-08-31 NOTE — Telephone Encounter (Signed)
-----   Message from Kate Sable, MD sent at 08/28/2021  6:17 PM EST ----- Regarding: RE: Theophilus Bones Thanks for the info.  Cervando Durnin, lets plan alternate testing.  Please order/obtain coronary CTA to see if this will be approved.  Please prescribe lopressor 100mg  x1, ivabradine 15mg  x 1 pre procedure.  Thank you ----- Message ----- From: Sharol Harness Sent: 08/27/2021   3:39 PM EST To: Kate Sable, MD, # Subject: Williemae Natter,  [employer] has denied pt's auth for their nuc. appt.Rene Paci faxed an appeal with their last OV and EKG results and will keep you posted on decision. Following are details of the denial:   Based on [employer] Cardiac [employer] Guidelines Section(s): Stress Testing with Imaging - Indications (CD 1.4), we cannot approve this request. Your healthcare provider told us that  there is a concern related to the blood vessels in your heart. The request cannot be  approved because: You must have one of the following. -Findings on your current electrocardiogram or ECG (a tracing of your heart's electrical  activity) that could make it hard to determine whether or not your heart is getting enough  blood supply with exercise.  -An inability to walk on a treadmill as much as needed to reach the target heart rate during  an exercise treadmill test or ETT (an ECG and blood pressure check done before, during,  and after walking on a treadmill). This would be due to the need for ambulatory assistance  (such as a wheelchair, cane, or walker), a significant neurologic (nerves and/or nervous  system) issue, or an orthopedic issue.  -A history of an ETT with results that were abnormal but did not appear to be due to  coronary artery disease (a disease to the blood vessels that provide blood to your heart  muscle).   Thank you, Anisah

## 2021-08-31 NOTE — Telephone Encounter (Signed)
Received the following message from pre-cert:  Abdul-Razzaaq, Graylon Good, Lilyrose Tanney, RN The only way to know they cover it is by withdrawing this one and submitting a new auth. I would need a new order to submit. Or a peer to peer discussion can be done to explain why the procedure is needed.   Thanks,  Anisah    Replied back requesting the withdraw of the Myoview, and informing pre-cert that an order has been entered, to please send in auth for the CCTA.

## 2021-08-31 NOTE — Telephone Encounter (Signed)
Called patient and informed her of the situation, and why we cancelled the Myoview. I informed her that I would be in touch as soon as I have information regarding the auth on the CCTA and if we will be able to proceed with that.  Patient verbalized understanding, agreed with plan, and was grateful for the call.

## 2021-09-01 MED ORDER — METOPROLOL TARTRATE 100 MG PO TABS: 100.0000 mg | ORAL_TABLET | Freq: Once | ORAL | 0 refills | Status: DC

## 2021-09-01 MED ORDER — IVABRADINE HCL 7.5 MG PO TABS: 15.0000 mg | ORAL_TABLET | Freq: Once | ORAL | 0 refills | Status: AC

## 2021-09-01 NOTE — Telephone Encounter (Signed)
° °  Abdul-Razzaaq, Janae Sauce, RN; Kate Sable, MD Good Morning,   I was able to get the CCTA approved and was told Holland Falling would need an additional auth for FFR.   [id number] - Auth# [id number] Exp - 02/28/22   0502T, 0503T, 0504T - Auth# [id number] Exp - 02/28/22   All have been documented on referral.   Thanks,  Shannan Harper

## 2021-09-01 NOTE — Telephone Encounter (Signed)
Called patient and informed her that the Coronary CT scan was approved. Scheduled her for 09/17/21 at 11:00. Reviewed all instructions with her and sent a copy to MyChart for her review. Patient was also scheduled for a lab draw on 09/10/21 in office for her BMP.  Patient was grateful for the follow up, verbalized understanding and agreed with plan.

## 2021-09-02 ENCOUNTER — Ambulatory Visit (INDEPENDENT_AMBULATORY_CARE_PROVIDER_SITE_OTHER): Payer: 59

## 2021-09-02 ENCOUNTER — Ambulatory Visit: Payer: 59

## 2021-09-02 ENCOUNTER — Other Ambulatory Visit: Payer: Self-pay

## 2021-09-02 DIAGNOSIS — R072 Precordial pain: Secondary | ICD-10-CM | POA: Diagnosis not present

## 2021-09-02 LAB — ECHOCARDIOGRAM COMPLETE
AR max vel: 2.72 cm2
AV Area VTI: 2.6 cm2
AV Area mean vel: 2.44 cm2
AV Mean grad: 4 mmHg
AV Peak grad: 8.3 mmHg
Ao pk vel: 1.44 m/s
Area-P 1/2: 4.04 cm2
Calc EF: 52.6 %
S' Lateral: 3.1 cm
Single Plane A2C EF: 50.2 %
Single Plane A4C EF: 54.5 %

## 2021-09-08 DIAGNOSIS — R002 Palpitations: Secondary | ICD-10-CM | POA: Diagnosis not present

## 2021-09-09 ENCOUNTER — Telehealth: Payer: Self-pay | Admitting: Family Medicine

## 2021-09-09 NOTE — Telephone Encounter (Signed)
OK for a sample for today- can we please check on PA?

## 2021-09-09 NOTE — Telephone Encounter (Signed)
Pt needs PA for Ozempic / her insurance wont cover wegovy, trulicity or rebelcus either/ if pt has to pay out of pocket the Ozempic is $1100 for 1 month supply/ pt is due to take it today and wants to know if Dr. Laural Benes can give her any samples today / please advise asap

## 2021-09-10 ENCOUNTER — Other Ambulatory Visit (INDEPENDENT_AMBULATORY_CARE_PROVIDER_SITE_OTHER): Payer: 59

## 2021-09-10 ENCOUNTER — Other Ambulatory Visit: Payer: Self-pay

## 2021-09-10 DIAGNOSIS — R072 Precordial pain: Secondary | ICD-10-CM | POA: Diagnosis not present

## 2021-09-11 ENCOUNTER — Other Ambulatory Visit: Payer: Self-pay | Admitting: Family Medicine

## 2021-09-11 LAB — BASIC METABOLIC PANEL
BUN/Creatinine Ratio: 11 (ref 9–23)
BUN: 9 mg/dL (ref 6–24)
CO2: 26 mmol/L (ref 20–29)
Calcium: 9.2 mg/dL (ref 8.7–10.2)
Chloride: 101 mmol/L (ref 96–106)
Creatinine, Ser: 0.8 mg/dL (ref 0.57–1.00)
Glucose: 185 mg/dL — ABNORMAL HIGH (ref 70–99)
Potassium: 4.4 mmol/L (ref 3.5–5.2)
Sodium: 139 mmol/L (ref 134–144)
eGFR: 94 mL/min/{1.73_m2} (ref >59)

## 2021-09-11 MED ORDER — OZEMPIC (0.25 OR 0.5 MG/DOSE) 2 MG/1.5ML ~~LOC~~ SOPN: 0.5000 mg | PEN_INJECTOR | SUBCUTANEOUS | 1 refills | Status: DC

## 2021-09-11 NOTE — Telephone Encounter (Signed)
Patient has been advised to come pick up sample today (0.5 dosage) PA has been initiated via CoverMyMeds  Key: [id number] Waiting on determination

## 2021-09-15 ENCOUNTER — Telehealth (HOSPITAL_COMMUNITY): Payer: Self-pay | Admitting: *Deleted

## 2021-09-15 NOTE — Telephone Encounter (Signed)
Reaching out to patient to offer assistance regarding upcoming cardiac imaging study; pt verbalizes understanding of appt date/time, parking situation and where to check in, pre-test NPO status and medications ordered, and verified current allergies; name and call back number provided for further questions should they arise  Ladora Osterberg RN Navigator Cardiac Imaging [facility] [phone] office [phone] cell  Patient to take 100mg metoprolol tartrate and 15mg ivabradine two hours prior to her cardiac CT scan. 

## 2021-09-17 ENCOUNTER — Other Ambulatory Visit: Payer: Self-pay

## 2021-09-17 ENCOUNTER — Ambulatory Visit
Admission: RE | Admit: 2021-09-17 | Discharge: 2021-09-17 | Disposition: A | Discharge status: Home / Self Care | Payer: 59 | Source: Ambulatory Visit | Attending: Cardiology | Admitting: Cardiology

## 2021-09-17 ENCOUNTER — Encounter: Payer: Self-pay | Admitting: Family Medicine

## 2021-09-17 DIAGNOSIS — R072 Precordial pain: Secondary | ICD-10-CM | POA: Diagnosis not present

## 2021-09-17 MED ORDER — DILTIAZEM HCL 25 MG/5ML IV SOLN
10.0000 mg | Freq: Once | INTRAVENOUS | Status: AC
  Administered 2021-09-17: 10 mg via INTRAVENOUS

## 2021-09-17 MED ORDER — METOPROLOL TARTRATE 5 MG/5ML IV SOLN
10.0000 mg | Freq: Once | INTRAVENOUS | Status: AC
  Administered 2021-09-17: 10 mg via INTRAVENOUS

## 2021-09-17 MED ORDER — METOPROLOL TARTRATE 5 MG/5ML IV SOLN
10.0000 mg | Freq: Once | INTRAVENOUS | Status: AC
Start: 2021-09-17 — End: 2021-09-17
  Administered 2021-09-17: 10 mg via INTRAVENOUS

## 2021-09-17 MED ORDER — NITROGLYCERIN 0.4 MG SL SUBL
0.4000 mg | SUBLINGUAL_TABLET | Freq: Once | SUBLINGUAL | Status: AC
Start: 2021-09-17 — End: 2021-09-17
  Administered 2021-09-17: 0.4 mg via SUBLINGUAL

## 2021-09-17 MED ORDER — NITROGLYCERIN 0.4 MG SL SUBL
0.8000 mg | SUBLINGUAL_TABLET | Freq: Once | SUBLINGUAL | Status: AC
  Administered 2021-09-17: 0.8 mg via SUBLINGUAL

## 2021-09-17 MED ORDER — IOHEXOL 350 MG/ML SOLN
100.0000 mL | Freq: Once | INTRAVENOUS | Status: AC | PRN
  Administered 2021-09-17: 100 mL via INTRAVENOUS

## 2021-09-17 MED ORDER — DILTIAZEM HCL 25 MG/5ML IV SOLN
10.0000 mg | Freq: Once | INTRAVENOUS | Status: AC
Start: 2021-09-17 — End: 2021-09-17
  Administered 2021-09-17: 10 mg via INTRAVENOUS

## 2021-09-17 NOTE — Progress Notes (Signed)
Patient tolerated CT well. Drank water after. Vital signs stable encourage to drink water throughout day.Reasons explained and verbalized understanding. Ambulated steady gait.  

## 2021-09-21 ENCOUNTER — Other Ambulatory Visit: Payer: Self-pay

## 2021-09-21 ENCOUNTER — Ambulatory Visit: Payer: 59 | Admitting: Cardiology

## 2021-09-21 ENCOUNTER — Encounter: Payer: Self-pay | Admitting: Cardiology

## 2021-09-21 VITALS — BP 130/72 | HR 101 | Ht 64.0 in | Wt 230.0 lb

## 2021-09-21 DIAGNOSIS — E782 Mixed hyperlipidemia: Secondary | ICD-10-CM | POA: Diagnosis not present

## 2021-09-21 DIAGNOSIS — R002 Palpitations: Secondary | ICD-10-CM | POA: Diagnosis not present

## 2021-09-21 DIAGNOSIS — R072 Precordial pain: Secondary | ICD-10-CM

## 2021-09-21 DIAGNOSIS — I10 Essential (primary) hypertension: Secondary | ICD-10-CM | POA: Diagnosis not present

## 2021-09-21 DIAGNOSIS — R0602 Shortness of breath: Secondary | ICD-10-CM
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
## 2021-09-21 NOTE — Patient Instructions (Signed)

## 2021-09-29 ENCOUNTER — Telehealth: Payer: Self-pay | Admitting: Family Medicine

## 2021-09-29 ENCOUNTER — Ambulatory Visit: Payer: 59 | Admitting: Family Medicine

## 2021-09-29 NOTE — Telephone Encounter (Signed)
Copied from CRM [id number]. Topic: Quick Communication - Rx Refill/Question >> Sep 29, 2021 12:43 PM Pawlus, Maxine Glenn A wrote: Pt was following up on a PA needed for her medication, pt has also sent MyChart messages and wanted an update, please advise.

## 2021-09-29 NOTE — Telephone Encounter (Signed)
PA initiated via CoverMyMeds with new insurance information Key: [id number] Waiting on response.

## 2021-10-01 ENCOUNTER — Encounter: Payer: Self-pay | Admitting: Family Medicine

## 2021-10-01 ENCOUNTER — Other Ambulatory Visit: Payer: Self-pay

## 2021-10-01 ENCOUNTER — Ambulatory Visit (INDEPENDENT_AMBULATORY_CARE_PROVIDER_SITE_OTHER): Payer: 59 | Admitting: Family Medicine

## 2021-10-01 VITALS — BP 118/80 | HR 80 | Temp 98.4°F | Wt 231.8 lb

## 2021-10-01 DIAGNOSIS — E1165 Type 2 diabetes mellitus with hyperglycemia: Secondary | ICD-10-CM | POA: Diagnosis not present

## 2021-10-01 MED ORDER — TRULICITY 0.75 MG/0.5ML ~~LOC~~ SOAJ: 0.7500 mg | SUBCUTANEOUS | 0 refills | Status: DC

## 2021-10-01 MED ORDER — TRULICITY 1.5 MG/0.5ML ~~LOC~~ SOAJ: 1.5000 mg | SUBCUTANEOUS | 0 refills | Status: DC

## 2021-10-01 MED ORDER — TRULICITY 3 MG/0.5ML ~~LOC~~ SOAJ: 3.0000 mg | SUBCUTANEOUS | 1 refills | Status: DC

## 2021-10-01 NOTE — Assessment & Plan Note (Signed)
Felt well on ozempic, but not covered. Will start trulicity and titrate up. Call with any concerns. Recheck 3 months.

## 2021-10-01 NOTE — Progress Notes (Signed)
BP 118/80    Pulse 80    Temp 98.4 F (36.9 C)    Wt 231 lb 12.8 oz (105.1 kg)    SpO2 99%    BMI 39.79 kg/m    Subjective:    Patient ID: Bonnie Armstrong, female    DOB: 1981/07/31, 41 y.o.   MRN: [id number]  HPI: Bonnie Armstrong is a 41 y.o. female  Chief Complaint  Patient presents with   Diabetes    Patient here to follow up, ozempic has been denied.    DIABETES Hypoglycemic episodes:no Polydipsia/polyuria: yes Visual disturbance: no Chest pain: no Paresthesias: no Glucose Monitoring: no  Accucheck frequency: Not Checking Taking Insulin?: no Blood Pressure Monitoring: not checking Retinal Examination: Not up to Date Foot Exam: Up to Date Diabetic Education: Completed Pneumovax: Up to Date Influenza: Not up to Date Aspirin: no  Relevant past medical, surgical, family and social history reviewed and updated as indicated. Interim medical history since our last visit reviewed. Allergies and medications reviewed and updated.  Review of Systems  Constitutional: Negative.   Respiratory: Negative.    Cardiovascular: Negative.   Gastrointestinal: Negative.   Musculoskeletal: Negative.   Psychiatric/Behavioral: Negative.     Per HPI unless specifically indicated above     Objective:    BP 118/80    Pulse 80    Temp 98.4 F (36.9 C)    Wt 231 lb 12.8 oz (105.1 kg)    SpO2 99%    BMI 39.79 kg/m   Wt Readings from Last 3 Encounters:  10/01/21 231 lb 12.8 oz (105.1 kg)  09/21/21 230 lb (104.3 kg)  08/13/21 230 lb 8 oz (104.6 kg)    Physical Exam Vitals and nursing note reviewed.  Constitutional:      General: She is not in acute distress.    Appearance: Normal appearance. She is not ill-appearing, toxic-appearing or diaphoretic.  HENT:     Head: Normocephalic and atraumatic.     Right Ear: External ear normal.     Left Ear: External ear normal.     Nose: Nose normal.     Mouth/Throat:     Mouth: Mucous membranes are moist.     Pharynx: Oropharynx is  clear.  Eyes:     General: No scleral icterus.       Right eye: No discharge.        Left eye: No discharge.     Extraocular Movements: Extraocular movements intact.     Conjunctiva/sclera: Conjunctivae normal.     Pupils: Pupils are equal, round, and reactive to light.  Cardiovascular:     Rate and Rhythm: Normal rate and regular rhythm.     Pulses: Normal pulses.     Heart sounds: Normal heart sounds. No murmur heard.   No friction rub. No gallop.  Pulmonary:     Effort: Pulmonary effort is normal. No respiratory distress.     Breath sounds: Normal breath sounds. No stridor. No wheezing, rhonchi or rales.  Chest:     Chest wall: No tenderness.  Musculoskeletal:        General: Normal range of motion.     Cervical back: Normal range of motion and neck supple.  Skin:    General: Skin is warm and dry.     Capillary Refill: Capillary refill takes less than 2 seconds.     Coloration: Skin is not jaundiced or pale.     Findings: No bruising, erythema, lesion or rash.  Neurological:  General: No focal deficit present.     Mental Status: She is alert and oriented to person, place, and time. Mental status is at baseline.  Psychiatric:        Mood and Affect: Mood normal.        Behavior: Behavior normal.        Thought Content: Thought content normal.        Judgment: Judgment normal.    Results for orders placed or performed in visit on 37/29/02  Basic metabolic panel  Result Value Ref Range   Glucose 185 (H) 70 - 99 mg/dL   BUN 9 6 - 24 mg/dL   Creatinine, Ser 0.80 0.57 - 1.00 mg/dL   eGFR 94 >59 mL/min/1.73   BUN/Creatinine Ratio 11 9 - 23   Sodium 139 134 - 144 mmol/L   Potassium 4.4 3.5 - 5.2 mmol/L   Chloride 101 96 - 106 mmol/L   CO2 26 20 - 29 mmol/L   Calcium 9.2 8.7 - 10.2 mg/dL      Assessment & Plan:   Problem List Items Addressed This Visit       Endocrine   Uncontrolled type 2 diabetes mellitus with hyperglycemia ([place]) - Primary    Felt well on  ozempic, but not covered. Will start trulicity and titrate up. Call with any concerns. Recheck 3 months.       Relevant Medications   Dulaglutide (TRULICITY) 1.11 BZ/2.0EY SOPN   Dulaglutide (TRULICITY) 1.5 EM/3.3KP SOPN   Dulaglutide (TRULICITY) 3 QA/4.4LP SOPN     Follow up plan: Return in about 3 months (around 12/29/2021).

## 2021-10-09 ENCOUNTER — Telehealth: Payer: Self-pay

## 2021-10-09 NOTE — Telephone Encounter (Signed)
Initiated PA via CoverMyMeds for Trulicity 0.75/0.5ML Key: [id number] Waiting on response.

## 2021-10-09 NOTE — Telephone Encounter (Signed)
Pt called in to see if there was any update on the PA, pt requested a call. Please advise.

## 2021-10-09 NOTE — Telephone Encounter (Signed)
Returned patient phone call, no answer LVM to have patient call back to clarify what medication she was asking about a prior authorization.

## 2021-10-09 NOTE — Telephone Encounter (Signed)
Patient returned the phone call and states Trulicity needs a prior authorization. Advised patient I will work on PA for [employer].

## 2021-10-12 ENCOUNTER — Telehealth: Payer: Self-pay

## 2021-10-12 NOTE — Telephone Encounter (Signed)
PA for Trulicity Injection 0.75MG /0.5ML approved via CoverMyMeds [id number] patient aware.

## 2021-10-27 DIAGNOSIS — E119 Type 2 diabetes mellitus without complications: Secondary | ICD-10-CM | POA: Diagnosis not present

## 2021-10-27 DIAGNOSIS — R29898 Other symptoms and signs involving the musculoskeletal system: Secondary | ICD-10-CM | POA: Diagnosis not present

## 2021-10-27 DIAGNOSIS — Z713 Dietary counseling and surveillance: Secondary | ICD-10-CM | POA: Diagnosis not present

## 2021-10-27 DIAGNOSIS — Z7689 Persons encountering health services in other specified circumstances: Secondary | ICD-10-CM | POA: Diagnosis not present

## 2021-10-27 DIAGNOSIS — K219 Gastro-esophageal reflux disease without esophagitis: Secondary | ICD-10-CM | POA: Diagnosis not present

## 2021-10-27 DIAGNOSIS — E782 Mixed hyperlipidemia: Secondary | ICD-10-CM | POA: Diagnosis not present

## 2021-10-27 DIAGNOSIS — E669 Obesity, unspecified: Secondary | ICD-10-CM | POA: Insufficient documentation

## 2021-10-27 DIAGNOSIS — M6281 Muscle weakness (generalized): Secondary | ICD-10-CM | POA: Diagnosis not present

## 2021-10-27 DIAGNOSIS — Z7985 Long-term (current) use of injectable non-insulin antidiabetic drugs: Secondary | ICD-10-CM | POA: Diagnosis not present

## 2021-10-27 DIAGNOSIS — Z8632 Personal history of gestational diabetes: Secondary | ICD-10-CM | POA: Diagnosis not present

## 2021-10-27 DIAGNOSIS — Z6841 Body Mass Index (BMI) 40.0 and over, adult: Secondary | ICD-10-CM | POA: Diagnosis not present

## 2021-10-27 DIAGNOSIS — I10 Essential (primary) hypertension: Secondary | ICD-10-CM | POA: Diagnosis not present

## 2021-10-27 DIAGNOSIS — Z7984 Long term (current) use of oral hypoglycemic drugs: Secondary | ICD-10-CM | POA: Diagnosis not present

## 2021-10-27 DIAGNOSIS — Z7189 Other specified counseling: Secondary | ICD-10-CM | POA: Diagnosis not present

## 2021-10-28 ENCOUNTER — Encounter: Payer: Self-pay | Admitting: Cardiology

## 2021-10-28 ENCOUNTER — Encounter: Payer: Self-pay | Admitting: Family Medicine

## 2021-10-28 ENCOUNTER — Telehealth: Payer: Self-pay | Admitting: Cardiology

## 2021-10-28 NOTE — Telephone Encounter (Signed)
? ?  Pre-operative Risk Assessment  ?  ?Patient Name: Bonnie Armstrong  ?DOB: 11-23-1980 ?MRN: [id number]  ? ?  ? ?Request for Surgical Clearance   ? ?Procedure:   Bariatric surgery  ? ?Date of Surgery:  Clearance TBD                              ?   ?Surgeon:  not listed ?Surgeon's Group or Practice Name:  [facility] for Metabolic and weight loss surgery  ?Phone number:  [phone] ?Fax number:  [phone] ?  ?Type of Clearance Requested:   ?- Medical  ?  ?Type of Anesthesia:  Not Indicated ?  ?Additional requests/questions:   ? ?Signed, ?Morrie Sheldon Gerringer   ?10/28/2021, 3:48 PM  ? ?

## 2021-10-28 NOTE — Telephone Encounter (Signed)
? ? ?  Patient Name: Bonnie Armstrong  ?DOB: [birth year] ?MRN: [id number] ? ?Primary Cardiologist: Kate Sable, MD ? ?Chart reviewed as part of pre-operative protocol coverage. Given past medical history and time since last visit, based on ACC/AHA guidelines, Bonnie Armstrong would be at acceptable risk for the planned procedure without further cardiovascular testing.  Recent coronary CT in January 2023 showed normal ejection fraction and no significant valve issue.  Coronary CT obtained in February 2023 showed 0 calcium score, no evidence of CAD.  Therefore patient is cleared to proceed with surgery.  She has a low risk candidate. ? ?I will route this recommendation to the requesting party via Epic fax function and remove from pre-op pool. ? ?Please call with questions. ? ?Almyra Deforest, [place] ?10/28/2021, 4:32 PM ? ?

## 2021-11-02 ENCOUNTER — Encounter: Payer: Self-pay | Admitting: Family Medicine

## 2021-11-02 ENCOUNTER — Other Ambulatory Visit: Payer: Self-pay

## 2021-11-02 ENCOUNTER — Ambulatory Visit (INDEPENDENT_AMBULATORY_CARE_PROVIDER_SITE_OTHER): Payer: 59 | Admitting: Family Medicine

## 2021-11-02 VITALS — BP 124/83 | HR 85 | Temp 98.4°F | Ht 64.0 in | Wt 233.8 lb

## 2021-11-02 DIAGNOSIS — E1165 Type 2 diabetes mellitus with hyperglycemia: Secondary | ICD-10-CM

## 2021-11-02 LAB — BAYER DCA HB A1C WAIVED: HB A1C (BAYER DCA - WAIVED): 7 % — ABNORMAL HIGH (ref 4.8–5.6)

## 2021-11-02 NOTE — Assessment & Plan Note (Signed)
To be seeing bariatric clinic. Encouraged diet and exercise. Will see how trulicity helps with weight. ?

## 2021-11-02 NOTE — Progress Notes (Signed)
? ?BP 124/83   Pulse 85   Temp 98.4 ?F (36.9 ?C) (Oral)   Ht _0  (1.626 m)   Wt 233 lb 12.8 oz (106.1 kg)   SpO2 97%   BMI 40.13 kg/m?   ? ?Subjective:  ? ? Patient ID: Bonnie Armstrong, female    DOB: 25-Jan-1981, 41 y.o.   MRN: [id number] ? ?HPI: ?Bonnie Armstrong is a 41 y.o. female ? ?Chief Complaint  ?Patient presents with  ? Diabetes  ?  Patient is here to follow up on Diabetes. Patient denies having any concerns at today's visit. Patient declines having a recent Diabetic Eye Exam.   ? Obesity  ? ?DIABETES ?Hypoglycemic episodes:no ?Polydipsia/polyuria: no ?Visual disturbance: no ?Chest pain: no ?Paresthesias: no ?Glucose Monitoring: no ? Accucheck frequency: Not Checking ?Taking Insulin?: no ?Blood Pressure Monitoring: not checking ?Retinal Examination: Not up to Date ?Foot Exam: Up to Date ?Diabetic Education: Completed ?Pneumovax: Up to Date ?Influenza: Not up to Date ?Aspirin: no ? ?Relevant past medical, surgical, family and social history reviewed and updated as indicated. Interim medical history since our last visit reviewed. ?Allergies and medications reviewed and updated. ? ?Review of Systems  ?Constitutional: Negative.   ?Respiratory: Negative.    ?Cardiovascular: Negative.   ?Gastrointestinal: Negative.   ?Musculoskeletal: Negative.   ?Psychiatric/Behavioral: Negative.    ? ?Per HPI unless specifically indicated above ? ?   ?Objective:  ?  ?BP 124/83   Pulse 85   Temp 98.4 ?F (36.9 ?C) (Oral)   Ht _1  (1.626 m)   Wt 233 lb 12.8 oz (106.1 kg)   SpO2 97%   BMI 40.13 kg/m?   ?Wt Readings from Last 3 Encounters:  ?11/02/21 233 lb 12.8 oz (106.1 kg)  ?10/01/21 231 lb 12.8 oz (105.1 kg)  ?09/21/21 230 lb (104.3 kg)  ?  ?Physical Exam ?Vitals and nursing note reviewed.  ?Constitutional:   ?   General: She is not in acute distress. ?   Appearance: Normal appearance. She is obese. She is not ill-appearing, toxic-appearing or diaphoretic.  ?HENT:  ?   Head: Normocephalic and atraumatic.  ?    Right Ear: External ear normal.  ?   Left Ear: External ear normal.  ?   Nose: Nose normal.  ?   Mouth/Throat:  ?   Mouth: Mucous membranes are moist.  ?   Pharynx: Oropharynx is clear.  ?Eyes:  ?   General: No scleral icterus.    ?   Right eye: No discharge.     ?   Left eye: No discharge.  ?   Extraocular Movements: Extraocular movements intact.  ?   Conjunctiva/sclera: Conjunctivae normal.  ?   Pupils: Pupils are equal, round, and reactive to light.  ?Cardiovascular:  ?   Rate and Rhythm: Normal rate and regular rhythm.  ?   Pulses: Normal pulses.  ?   Heart sounds: Normal heart sounds. No murmur heard. ?  No friction rub. No gallop.  ?Pulmonary:  ?   Effort: Pulmonary effort is normal. No respiratory distress.  ?   Breath sounds: Normal breath sounds. No stridor. No wheezing, rhonchi or rales.  ?Chest:  ?   Chest wall: No tenderness.  ?Musculoskeletal:     ?   General: Normal range of motion.  ?   Cervical back: Normal range of motion and neck supple.  ?Skin: ?   General: Skin is warm and dry.  ?   Capillary Refill: Capillary refill takes less  than 2 seconds.  ?   Coloration: Skin is not jaundiced or pale.  ?   Findings: No bruising, erythema, lesion or rash.  ?Neurological:  ?   General: No focal deficit present.  ?   Mental Status: She is alert and oriented to person, place, and time. Mental status is at baseline.  ?Psychiatric:     ?   Mood and Affect: Mood normal.     ?   Behavior: Behavior normal.     ?   Thought Content: Thought content normal.     ?   Judgment: Judgment normal.  ? ? ?Results for orders placed or performed in visit on 09/10/21  ?Basic metabolic panel  ?Result Value Ref Range  ? Glucose 185 (H) 70 - 99 mg/dL  ? BUN 9 6 - 24 mg/dL  ? Creatinine, Ser 0.80 0.57 - 1.00 mg/dL  ? eGFR 94 >59 mL/min/1.73  ? BUN/Creatinine Ratio 11 9 - 23  ? Sodium 139 134 - 144 mmol/L  ? Potassium 4.4 3.5 - 5.2 mmol/L  ? Chloride 101 96 - 106 mmol/L  ? CO2 26 20 - 29 mmol/L  ? Calcium 9.2 8.7 - 10.2 mg/dL  ? ?    ?Assessment & Plan:  ? ?Problem List Items Addressed This Visit   ? ?  ? Endocrine  ? Uncontrolled type 2 diabetes mellitus with hyperglycemia (HCC) - Primary  ?  Doing great with A1c of 7.0. Tolerating trulicity well. Will increase to 1.8ZB trulicity and recheck 1 month for tolerance with goal of increasing to 86m.  ?  ?  ? Relevant Orders  ? Bayer DCA Hb A1c Waived  ?  ? Other  ? Morbid obesity ([place]  ?  Encouraged continued diet and exercise with goal of losing 1-2lbs per week. Continue to follow with bariatrics, nutrition. Continue to monitor. Call with any concerns. Has had 6 visits discussing her weight since June 2021. Will continue to monitor.  ?  ?  ?  ? ?Follow up plan: ?Return for monthly for next 6 months for weight. ? ? ? ? ? ?

## 2021-11-02 NOTE — Assessment & Plan Note (Addendum)
Encouraged continued diet and exercise with goal of losing 1-2lbs per week. Continue to follow with bariatrics, nutrition. Continue to monitor. Call with any concerns. Has had 6 visits discussing her weight since June 2021. Will continue to monitor.  ?

## 2021-11-02 NOTE — Assessment & Plan Note (Signed)
Doing great with A1c of 7.0. Tolerating trulicity well. Will increase to 1.5mg  trulicity and recheck 1 month for tolerance with goal of increasing to 3mg .  ?

## 2021-11-08 ENCOUNTER — Other Ambulatory Visit: Payer: Self-pay | Admitting: Family Medicine

## 2021-11-09 DIAGNOSIS — Z6841 Body Mass Index (BMI) 40.0 and over, adult: Secondary | ICD-10-CM | POA: Diagnosis not present

## 2021-11-09 DIAGNOSIS — E782 Mixed hyperlipidemia: Secondary | ICD-10-CM | POA: Diagnosis not present

## 2021-11-09 DIAGNOSIS — E1165 Type 2 diabetes mellitus with hyperglycemia: Secondary | ICD-10-CM | POA: Diagnosis not present

## 2021-11-10 NOTE — Telephone Encounter (Signed)
Change in therapy. Not on current med profile. ?Requested Prescriptions  ?Pending Prescriptions Disp Refills  ?? TRULICITY 0.75 MG/0.5ML SOPN [Pharmacy Med Name: TRULICITY 0.75 MG/0.5 ML PEN]    ?  Sig: INJECT 0.75 MG INTO THE SKIN ONCE A WEEK.  ?  ? Endocrinology:  Diabetes - GLP-1 Receptor Agonists Passed - 11/08/2021  9:30 AM  ?  ?  Passed - HBA1C is between 0 and 7.9 and within 180 days  ?  HB A1C (BAYER DCA - WAIVED)  ?Date Value Ref Range Status  ?11/02/2021 7.0 (H) 4.8 - 5.6 % Final  ?  Comment:  ?           Prediabetes: 5.7 - 6.4 ?         Diabetes: >6.4 ?         Glycemic control for adults with diabetes: <7.0 ?  ?   ?  ?  Passed - Valid encounter within last 6 months  ?  Recent Outpatient Visits   ?      ? 1 week ago Uncontrolled type 2 diabetes mellitus with hyperglycemia (HCC)  ? [facility] [place], Megan P, DO  ? 1 month ago Uncontrolled type 2 diabetes mellitus with hyperglycemia (HCC)  ? [facility] [place], [place] P, DO  ? 3 months ago Uncontrolled type 2 diabetes mellitus with hyperglycemia (HCC)  ? [facility] [place], [place] P, DO  ? 1 year ago Uncontrolled type 2 diabetes mellitus with hyperglycemia (HCC)  ? [facility] [place], [place] P, DO  ? 1 year ago Uncontrolled type 2 diabetes mellitus with hyperglycemia (HCC)  ? [facility] [place], [place] P, DO  ?  ?  ?Future Appointments   ?        ? In 3 weeks Laural Benes, Oralia Rud, DO [facility]  ? In 1 month Johnson, Oralia Rud, DO [facility]  ?  ? ?  ?  ?  ? ? ?

## 2021-11-11 DIAGNOSIS — R69 Illness, unspecified: Secondary | ICD-10-CM | POA: Diagnosis not present

## 2021-11-11 DIAGNOSIS — Z7689 Persons encountering health services in other specified circumstances: Secondary | ICD-10-CM | POA: Diagnosis not present

## 2021-11-11 DIAGNOSIS — Z6841 Body Mass Index (BMI) 40.0 and over, adult: Secondary | ICD-10-CM | POA: Diagnosis not present

## 2021-11-11 DIAGNOSIS — Z01818 Encounter for other preprocedural examination: Secondary | ICD-10-CM | POA: Diagnosis not present

## 2021-11-14 DIAGNOSIS — R69 Illness, unspecified: Secondary | ICD-10-CM | POA: Diagnosis not present

## 2021-11-18 DIAGNOSIS — Z6841 Body Mass Index (BMI) 40.0 and over, adult: Secondary | ICD-10-CM | POA: Diagnosis not present

## 2021-11-18 DIAGNOSIS — Z713 Dietary counseling and surveillance: Secondary | ICD-10-CM | POA: Diagnosis not present

## 2021-11-25 DIAGNOSIS — Z6841 Body Mass Index (BMI) 40.0 and over, adult: Secondary | ICD-10-CM | POA: Diagnosis not present

## 2021-11-25 DIAGNOSIS — Z01818 Encounter for other preprocedural examination: Secondary | ICD-10-CM | POA: Diagnosis not present

## 2021-12-01 ENCOUNTER — Encounter: Payer: Self-pay | Admitting: Family Medicine

## 2021-12-01 DIAGNOSIS — S161XXA Strain of muscle, fascia and tendon at neck level, initial encounter: Secondary | ICD-10-CM | POA: Diagnosis not present

## 2021-12-01 DIAGNOSIS — M542 Cervicalgia: Secondary | ICD-10-CM | POA: Diagnosis not present

## 2021-12-01 DIAGNOSIS — M5412 Radiculopathy, cervical region: Secondary | ICD-10-CM | POA: Diagnosis not present

## 2021-12-03 ENCOUNTER — Encounter: Payer: Self-pay | Admitting: Family Medicine

## 2021-12-03 ENCOUNTER — Ambulatory Visit (INDEPENDENT_AMBULATORY_CARE_PROVIDER_SITE_OTHER): Payer: 59 | Admitting: Family Medicine

## 2021-12-03 VITALS — BP 128/85 | HR 86 | Temp 98.2°F | Wt 232.2 lb

## 2021-12-03 DIAGNOSIS — B372 Candidiasis of skin and nail: Secondary | ICD-10-CM

## 2021-12-03 DIAGNOSIS — E1165 Type 2 diabetes mellitus with hyperglycemia: Secondary | ICD-10-CM | POA: Diagnosis not present

## 2021-12-03 MED ORDER — NYSTATIN 100000 UNIT/GM EX CREA: 1.0000 "application " | TOPICAL_CREAM | Freq: Two times a day (BID) | CUTANEOUS | 6 refills | Status: DC

## 2021-12-03 NOTE — Progress Notes (Signed)
? ?BP 128/85   Pulse 86   Temp 98.2 ?F (36.8 ?C)   Wt 232 lb 3.2 oz (105.3 kg)   SpO2 98%   BMI 39.86 kg/m?   ? ?Subjective:  ? ? Patient ID: Bonnie Armstrong, female    DOB: [birth year], 41 y.o.   MRN: [id number] ? ?HPI: ?Bonnie Armstrong is a 41 y.o. female ? ?Chief Complaint  ?Patient presents with  ? Diabetes  ? ?OBESITY ?Duration: chronic ?Previous attempts at weight loss: yes ?Complications of obesity: diabetes, HTN, HLD ?Peak weight: 250 ?Weight loss goal: to be healthy ?Weight loss to date: 18lbs ?Requesting obesity pharmacotherapy: no ?Current weight loss supplements/medications: no ?Previous weight loss supplements/meds: no ? ?DIABETES ?Hypoglycemic episodes:no ?Polydipsia/polyuria: no ?Visual disturbance: no ?Chest pain: no ?Paresthesias: no ?Glucose Monitoring: yes ?Taking Insulin?: no ?Blood Pressure Monitoring: not checking ?Retinal Examination: Up to Date ?Foot Exam: Up to Date ?Diabetic Education: Completed ?Pneumovax: Up to Date ?Influenza: Up to Date ?Aspirin: no ? ? ?Relevant past medical, surgical, family and social history reviewed and updated as indicated. Interim medical history since our last visit reviewed. ?Allergies and medications reviewed and updated. ? ?Review of Systems  ?Constitutional: Negative.   ?Respiratory: Negative.    ?Cardiovascular: Negative.   ?Gastrointestinal: Negative.   ?Musculoskeletal: Negative.   ?Neurological: Negative.   ?Psychiatric/Behavioral: Negative.    ? ?Per HPI unless specifically indicated above ? ?   ?Objective:  ?  ?BP 128/85   Pulse 86   Temp 98.2 ?F (36.8 ?C)   Wt 232 lb 3.2 oz (105.3 kg)   SpO2 98%   BMI 39.86 kg/m?   ?Wt Readings from Last 3 Encounters:  ?12/03/21 232 lb 3.2 oz (105.3 kg)  ?11/02/21 233 lb 12.8 oz (106.1 kg)  ?10/01/21 231 lb 12.8 oz (105.1 kg)  ?  ?Physical Exam ?Vitals and nursing note reviewed.  ?Constitutional:   ?   General: She is not in acute distress. ?   Appearance: Normal appearance. She is not ill-appearing,  toxic-appearing or diaphoretic.  ?HENT:  ?   Head: Normocephalic and atraumatic.  ?   Right Ear: External ear normal.  ?   Left Ear: External ear normal.  ?   Nose: Nose normal.  ?   Mouth/Throat:  ?   Mouth: Mucous membranes are moist.  ?   Pharynx: Oropharynx is clear.  ?Eyes:  ?   General: No scleral icterus.    ?   Right eye: No discharge.     ?   Left eye: No discharge.  ?   Extraocular Movements: Extraocular movements intact.  ?   Conjunctiva/sclera: Conjunctivae normal.  ?   Pupils: Pupils are equal, round, and reactive to light.  ?Cardiovascular:  ?   Rate and Rhythm: Normal rate and regular rhythm.  ?   Pulses: Normal pulses.  ?   Heart sounds: Normal heart sounds. No murmur heard. ?  No friction rub. No gallop.  ?Pulmonary:  ?   Effort: Pulmonary effort is normal. No respiratory distress.  ?   Breath sounds: Normal breath sounds. No stridor. No wheezing, rhonchi or rales.  ?Chest:  ?   Chest wall: No tenderness.  ?Musculoskeletal:     ?   General: Normal range of motion.  ?   Cervical back: Normal range of motion and neck supple.  ?Skin: ?   General: Skin is warm and dry.  ?   Capillary Refill: Capillary refill takes less than 2 seconds.  ?  Coloration: Skin is not jaundiced or pale.  ?   Findings: No bruising, erythema, lesion or rash.  ?Neurological:  ?   General: No focal deficit present.  ?   Mental Status: She is alert and oriented to person, place, and time. Mental status is at baseline.  ?Psychiatric:     ?   Mood and Affect: Mood normal.     ?   Behavior: Behavior normal.     ?   Thought Content: Thought content normal.     ?   Judgment: Judgment normal.  ? ? ?Results for orders placed or performed in visit on 11/02/21  ?Bayer DCA Hb A1c Waived  ?Result Value Ref Range  ? HB A1C (BAYER DCA - WAIVED) 7.0 (H) 4.8 - 5.6 %  ? ?   ?Assessment & Plan:  ? ?Problem List Items Addressed This Visit   ? ?  ? Endocrine  ? Uncontrolled type 2 diabetes mellitus with hyperglycemia (HCC) - Primary  ?  Tolerating  her trulicity well. Going up to 3mg  next week. Continue to monitor. clal with any concerns.  ? ?  ?  ? Relevant Medications  ? rosuvastatin (CRESTOR) 5 MG tablet  ?  ? Other  ? Morbid obesity ([place])  ?  Encouraged diet and exercise with goal of losing 1-2lbs per week. Continue to follow with bariatrics. Call with any concerns.  ? ?  ?  ? ?Other Visit Diagnoses   ? ? Candidal intertrigo      ? Will treat with nystatin. Call with any concerns.   ? Relevant Medications  ? nystatin cream (MYCOSTATIN)  ? ?  ?  ? ?Follow up plan: ?Return in about 2 months (around 02/02/2022). ? ? ? ? ? ?

## 2021-12-03 NOTE — Assessment & Plan Note (Signed)
Tolerating her trulicity well. Going up to 3mg  next week. Continue to monitor. clal with any concerns.  ?

## 2021-12-03 NOTE — Assessment & Plan Note (Signed)
Encouraged diet and exercise with goal of losing 1-2lbs per week. Continue to follow with bariatrics. Call with any concerns.  ?

## 2021-12-04 ENCOUNTER — Other Ambulatory Visit: Payer: Self-pay | Admitting: Family Medicine

## 2021-12-04 NOTE — Telephone Encounter (Signed)
Per OV note on 12/03/21 pt will be going to 3mg  dose next week. Pt has remaining refills on that dose.  ? ?Requested Prescriptions  ?Pending Prescriptions Disp Refills  ?? TRULICITY 1.5 MG/0.5ML SOPN [Pharmacy Med Name: TRULICITY 1.5 MG/0.5 ML PEN]    ?  Sig: INJECT 1.5 MG INTO THE SKIN ONCE A WEEK.  ?  ? Endocrinology:  Diabetes - GLP-1 Receptor Agonists Passed - 12/04/2021  8:59 AM  ?  ?  Passed - HBA1C is between 0 and 7.9 and within 180 days  ?  HB A1C (BAYER DCA - WAIVED)  ?Date Value Ref Range Status  ?11/02/2021 7.0 (H) 4.8 - 5.6 % Final  ?  Comment:  ?           Prediabetes: 5.7 - 6.4 ?         Diabetes: >6.4 ?         Glycemic control for adults with diabetes: <7.0 ?  ?   ?  ?  Passed - Valid encounter within last 6 months  ?  Recent Outpatient Visits   ?      ? Yesterday Uncontrolled type 2 diabetes mellitus with hyperglycemia (HCC)  ? [facility] [place], Megan P, DO  ? 1 month ago Uncontrolled type 2 diabetes mellitus with hyperglycemia (HCC)  ? [facility] [place], [place], [place]  ? 2 months ago Uncontrolled type 2 diabetes mellitus with hyperglycemia (HCC)  ? [facility] [place], [place], [place]  ? 4 months ago Uncontrolled type 2 diabetes mellitus with hyperglycemia (HCC)  ? [facility] [place], [place]  ? 1 year ago Uncontrolled type 2 diabetes mellitus with hyperglycemia (HCC)  ? [facility] [place], DO  ?  ?  ?Future Appointments   ?        ? In 1 week [place], Laural Benes, DO [facility]  ?  ? ?  ?  ?  ?? TRULICITY 0.75 MG/0.5ML SOPN [Pharmacy Med Name: TRULICITY 0.75 MG/0.5 ML PEN]    ?  Sig: INJECT 0.75 MG INTO THE SKIN ONCE A WEEK.  ?  ? Endocrinology:  Diabetes - GLP-1 Receptor Agonists Passed - 12/04/2021  8:59 AM  ?  ?  Passed - HBA1C is between 0 and 7.9 and within 180 days  ?  HB A1C (BAYER DCA - WAIVED)  ?Date Value Ref Range Status  ?11/02/2021 7.0 (H) 4.8 - 5.6 % Final  ?  Comment:  ?            Prediabetes: 5.7 - 6.4 ?         Diabetes: >6.4 ?         Glycemic control for adults with diabetes: <7.0 ?  ?   ?  ?  Passed - Valid encounter within last 6 months  ?  Recent Outpatient Visits   ?      ? Yesterday Uncontrolled type 2 diabetes mellitus with hyperglycemia (HCC)  ? [facility] [place], Megan P, DO  ? 1 month ago Uncontrolled type 2 diabetes mellitus with hyperglycemia (HCC)  ? [facility] [place], [place], DO  ? 2 months ago Uncontrolled type 2 diabetes mellitus with hyperglycemia (HCC)  ? [facility] [place], [place], DO  ? 4 months ago Uncontrolled type 2 diabetes mellitus with hyperglycemia (HCC)  ? [facility] [place], Megan P, DO  ? 1 year ago Uncontrolled type 2 diabetes  mellitus with hyperglycemia (HCC)  ? [facility] [place], [place] P, DO  ?  ?  ?Future Appointments   ?        ? In 1 week Laural Benes, Oralia Rud, DO [facility]  ?  ? ?  ?  ?  ? ? ?

## 2021-12-07 DIAGNOSIS — M542 Cervicalgia: Secondary | ICD-10-CM | POA: Diagnosis not present

## 2021-12-07 DIAGNOSIS — M5412 Radiculopathy, cervical region: Secondary | ICD-10-CM | POA: Diagnosis not present

## 2021-12-08 ENCOUNTER — Encounter: Payer: Self-pay | Admitting: Family Medicine

## 2021-12-08 DIAGNOSIS — M5412 Radiculopathy, cervical region: Secondary | ICD-10-CM | POA: Diagnosis not present

## 2021-12-09 ENCOUNTER — Telehealth: Payer: Self-pay

## 2021-12-09 NOTE — Telephone Encounter (Signed)
PA initiated via CoverMyMeds for Trulicity 3MG   ?Key: [id number] ?Waiting on determination.  ?

## 2021-12-10 ENCOUNTER — Encounter: Payer: Self-pay | Admitting: Family Medicine

## 2021-12-10 DIAGNOSIS — E119 Type 2 diabetes mellitus without complications: Secondary | ICD-10-CM | POA: Diagnosis not present

## 2021-12-10 DIAGNOSIS — Z713 Dietary counseling and surveillance: Secondary | ICD-10-CM | POA: Diagnosis not present

## 2021-12-10 DIAGNOSIS — Z01818 Encounter for other preprocedural examination: Secondary | ICD-10-CM | POA: Diagnosis not present

## 2021-12-10 DIAGNOSIS — Z6841 Body Mass Index (BMI) 40.0 and over, adult: Secondary | ICD-10-CM | POA: Diagnosis not present

## 2021-12-10 NOTE — Telephone Encounter (Signed)
PA for Trulicity 3MG  approved via CoverMyMeds  ?Will notify patient.  ?

## 2021-12-15 MED ORDER — TRULICITY 1.5 MG/0.5ML ~~LOC~~ SOAJ: 1.5000 mg | SUBCUTANEOUS | 0 refills | Status: DC

## 2021-12-16 ENCOUNTER — Other Ambulatory Visit: Payer: Self-pay

## 2021-12-16 MED ORDER — TRULICITY 3 MG/0.5ML ~~LOC~~ SOAJ
3.0000 mg | SUBCUTANEOUS | 1 refills | Status: DC
Start: 2021-12-16 — End: 2022-04-14

## 2021-12-16 NOTE — Telephone Encounter (Signed)
Please send medication to [facility] due to [facility] not having trulicity 3.0mg  in stock.  ?

## 2021-12-16 NOTE — Telephone Encounter (Signed)
Pharmacy called in to in form Dr Laural Benes that the Rx for Trulicity 1.5 still need prior authorization also  ?

## 2021-12-16 NOTE — Telephone Encounter (Signed)
PA not needed, 1.5MG  due to [facility] not having 3.0 mg of Trulicity. Provider has sent new rx for 3.0 trulicity to [facility].  ?

## 2021-12-17 ENCOUNTER — Ambulatory Visit: Payer: 59 | Admitting: Family Medicine

## 2021-12-18 ENCOUNTER — Encounter: Payer: Self-pay | Admitting: Family Medicine

## 2021-12-18 ENCOUNTER — Ambulatory Visit (INDEPENDENT_AMBULATORY_CARE_PROVIDER_SITE_OTHER): Payer: 59 | Admitting: Family Medicine

## 2021-12-18 DIAGNOSIS — B372 Candidiasis of skin and nail: Secondary | ICD-10-CM

## 2021-12-18 NOTE — Assessment & Plan Note (Signed)
Continues to work with bariatrics and nutrition. To have bariatric surgery over the summer. No other concerns or complaints at this time. Continue diet and exercise with goal of losing 1-2lbs per week.  ?

## 2021-12-18 NOTE — Progress Notes (Signed)
? ?BP 125/87   Pulse 88   Temp 98.2 ?F (36.8 ?C)   Wt 232 lb 12.8 oz (105.6 kg)   SpO2 99%   BMI 39.96 kg/m?   ? ?Subjective:  ? ? Patient ID: Bonnie Armstrong, female    DOB: 22-Feb-1981, 41 y.o.   MRN: [id number] ? ?HPI: ?Bonnie Armstrong is a 41 y.o. female ? ?Chief Complaint  ?Patient presents with  ? Obesity  ? ?Here today for follow up on her weight. Planning bariatric surgery over the summer. Has been working with the nutritionist and going through the testing for bariatric surgery. She is generally feeling well. Has had difficulty getting trulicity 3mg  due to issues with the pharmacy. She was just able to pick it up a couple of days ago. Weight has been stable. Working on trying to be more active.  ? ?SKIN INFECTION ?Duration: chronic, worse in the past couple of weeks ?Location: in belly creases ?History of trauma in area: no ?Pain: no ?Quality: prickling ?Severity: moderate ?Redness: yes ?Swelling: yes ?Oozing: no ?Pus: no ?Fevers: no ?Nausea/vomiting: no ?Status: worse ?Treatments attempted: nysatin   ?Tetanus: UTD ? ?Relevant past medical, surgical, family and social history reviewed and updated as indicated. Interim medical history since our last visit reviewed. ?Allergies and medications reviewed and updated. ? ?Review of Systems  ?Constitutional: Negative.   ?Respiratory: Negative.    ?Cardiovascular: Negative.   ?Gastrointestinal: Negative.   ?Psychiatric/Behavioral: Negative.    ? ?Per HPI unless specifically indicated above ? ?   ?Objective:  ?  ?BP 125/87   Pulse 88   Temp 98.2 ?F (36.8 ?C)   Wt 232 lb 12.8 oz (105.6 kg)   SpO2 99%   BMI 39.96 kg/m?   ?Wt Readings from Last 3 Encounters:  ?12/18/21 232 lb 12.8 oz (105.6 kg)  ?12/03/21 232 lb 3.2 oz (105.3 kg)  ?11/02/21 233 lb 12.8 oz (106.1 kg)  ?  ?Physical Exam ?Vitals and nursing note reviewed.  ?Constitutional:   ?   General: She is not in acute distress. ?   Appearance: Normal appearance. She is obese. She is not ill-appearing,  toxic-appearing or diaphoretic.  ?HENT:  ?   Head: Normocephalic and atraumatic.  ?   Right Ear: External ear normal.  ?   Left Ear: External ear normal.  ?   Nose: Nose normal.  ?   Mouth/Throat:  ?   Mouth: Mucous membranes are moist.  ?   Pharynx: Oropharynx is clear.  ?Eyes:  ?   General: No scleral icterus.    ?   Right eye: No discharge.     ?   Left eye: No discharge.  ?   Extraocular Movements: Extraocular movements intact.  ?   Conjunctiva/sclera: Conjunctivae normal.  ?   Pupils: Pupils are equal, round, and reactive to light.  ?Cardiovascular:  ?   Rate and Rhythm: Normal rate and regular rhythm.  ?   Pulses: Normal pulses.  ?   Heart sounds: Normal heart sounds. No murmur heard. ?  No friction rub. No gallop.  ?Pulmonary:  ?   Effort: Pulmonary effort is normal. No respiratory distress.  ?   Breath sounds: Normal breath sounds. No stridor. No wheezing, rhonchi or rales.  ?Chest:  ?   Chest wall: No tenderness.  ?Musculoskeletal:     ?   General: Normal range of motion.  ?   Cervical back: Normal range of motion and neck supple.  ?Skin: ?  General: Skin is warm and dry.  ?   Capillary Refill: Capillary refill takes less than 2 seconds.  ?   Coloration: Skin is not jaundiced or pale.  ?   Findings: No bruising, erythema, lesion or rash.  ?Neurological:  ?   General: No focal deficit present.  ?   Mental Status: She is alert and oriented to person, place, and time. Mental status is at baseline.  ?Psychiatric:     ?   Mood and Affect: Mood normal.     ?   Behavior: Behavior normal.     ?   Thought Content: Thought content normal.     ?   Judgment: Judgment normal.  ? ? ?Results for orders placed or performed in visit on 11/02/21  ?Bayer DCA Hb A1c Waived  ?Result Value Ref Range  ? HB A1C (BAYER DCA - WAIVED) 7.0 (H) 4.8 - 5.6 %  ? ?   ?Assessment & Plan:  ? ?Problem List Items Addressed This Visit   ? ?  ? Other  ? Morbid obesity (HCC) - Primary  ?  Continues to work with bariatrics and nutrition. To have  bariatric surgery over the summer. No other concerns or complaints at this time. Continue diet and exercise with goal of losing 1-2lbs per week.  ? ?  ?  ? ?Other Visit Diagnoses   ? ? Candidal intertrigo      ? Conitnue nystatin. Continue to monitor. Call with any conerns.   ? ?  ?  ? ?Follow up plan: ?Return July DM follow up. ? ? ? ? ? ?

## 2021-12-20 ENCOUNTER — Encounter: Payer: Self-pay | Admitting: Family Medicine

## 2021-12-30 ENCOUNTER — Ambulatory Visit: Payer: 59 | Admitting: Family Medicine

## 2022-01-12 ENCOUNTER — Encounter: Payer: Self-pay | Admitting: Family Medicine

## 2022-01-12 DIAGNOSIS — Z713 Dietary counseling and surveillance: Secondary | ICD-10-CM | POA: Diagnosis not present

## 2022-01-12 DIAGNOSIS — I10 Essential (primary) hypertension: Secondary | ICD-10-CM | POA: Diagnosis not present

## 2022-01-12 DIAGNOSIS — Z6841 Body Mass Index (BMI) 40.0 and over, adult: Secondary | ICD-10-CM | POA: Diagnosis not present

## 2022-01-12 DIAGNOSIS — E119 Type 2 diabetes mellitus without complications: Secondary | ICD-10-CM | POA: Diagnosis not present

## 2022-01-12 DIAGNOSIS — Z79899 Other long term (current) drug therapy: Secondary | ICD-10-CM | POA: Diagnosis not present

## 2022-01-26 DIAGNOSIS — E119 Type 2 diabetes mellitus without complications: Secondary | ICD-10-CM | POA: Diagnosis not present

## 2022-01-26 DIAGNOSIS — I10 Essential (primary) hypertension: Secondary | ICD-10-CM | POA: Diagnosis not present

## 2022-01-26 DIAGNOSIS — E782 Mixed hyperlipidemia: Secondary | ICD-10-CM | POA: Diagnosis not present

## 2022-01-26 DIAGNOSIS — Z7189 Other specified counseling: Secondary | ICD-10-CM | POA: Diagnosis not present

## 2022-01-26 DIAGNOSIS — Z6841 Body Mass Index (BMI) 40.0 and over, adult: Secondary | ICD-10-CM | POA: Diagnosis not present

## 2022-02-09 ENCOUNTER — Encounter: Payer: Self-pay | Admitting: Family Medicine

## 2022-02-26 DIAGNOSIS — I10 Essential (primary) hypertension: Secondary | ICD-10-CM | POA: Diagnosis not present

## 2022-02-26 DIAGNOSIS — E119 Type 2 diabetes mellitus without complications: Secondary | ICD-10-CM | POA: Diagnosis not present

## 2022-02-26 DIAGNOSIS — Z6841 Body Mass Index (BMI) 40.0 and over, adult: Secondary | ICD-10-CM | POA: Diagnosis not present

## 2022-02-26 HISTORY — PX: BARIATRIC SURGERY: SHX1103

## 2022-02-27 DIAGNOSIS — Z01818 Encounter for other preprocedural examination: Secondary | ICD-10-CM | POA: Diagnosis not present

## 2022-03-01 ENCOUNTER — Ambulatory Visit: Payer: 59 | Admitting: Family Medicine

## 2022-03-16 DIAGNOSIS — R69 Illness, unspecified: Secondary | ICD-10-CM | POA: Diagnosis not present

## 2022-03-25 ENCOUNTER — Ambulatory Visit: Payer: 59 | Admitting: Family Medicine

## 2022-03-30 DIAGNOSIS — Z48815 Encounter for surgical aftercare following surgery on the digestive system: Secondary | ICD-10-CM | POA: Diagnosis not present

## 2022-03-30 DIAGNOSIS — Z9884 Bariatric surgery status: Secondary | ICD-10-CM | POA: Diagnosis not present

## 2022-04-14 ENCOUNTER — Ambulatory Visit: Payer: 59 | Admitting: Family Medicine

## 2022-04-14 ENCOUNTER — Encounter: Payer: Self-pay | Admitting: Family Medicine

## 2022-04-14 VITALS — BP 111/74 | HR 75 | Temp 98.1°F | Wt 199.0 lb

## 2022-04-14 DIAGNOSIS — Z8639 Personal history of other endocrine, nutritional and metabolic disease: Secondary | ICD-10-CM

## 2022-04-14 DIAGNOSIS — I10 Essential (primary) hypertension: Secondary | ICD-10-CM | POA: Diagnosis not present

## 2022-04-14 DIAGNOSIS — B372 Candidiasis of skin and nail: Secondary | ICD-10-CM | POA: Diagnosis not present

## 2022-04-14 DIAGNOSIS — Z903 Acquired absence of stomach [part of]: Secondary | ICD-10-CM | POA: Diagnosis not present

## 2022-04-14 DIAGNOSIS — E1165 Type 2 diabetes mellitus with hyperglycemia: Secondary | ICD-10-CM | POA: Diagnosis not present

## 2022-04-14 LAB — BAYER DCA HB A1C WAIVED: HB A1C (BAYER DCA - WAIVED): 5.4 % (ref 4.8–5.6)

## 2022-04-14 MED ORDER — NYSTATIN 100000 UNIT/GM EX CREA
1.0000 | TOPICAL_CREAM | Freq: Two times a day (BID) | CUTANEOUS | 6 refills | Status: DC
Start: 2022-04-14 — End: 2023-05-12

## 2022-04-14 NOTE — Assessment & Plan Note (Signed)
Doing great after gastric sleeve. Will resolve off her problem list. A1c of 5.4.

## 2022-04-14 NOTE — Progress Notes (Signed)
BP 111/74   Pulse 75   Temp 98.1 F (36.7 C)   Wt 199 lb (90.3 kg)   SpO2 99%   BMI 34.16 kg/m    Subjective:    Patient ID: Bonnie Armstrong, female    DOB: 06-Jun-1981, 41 y.o.   MRN: [id number]  HPI: [name] is a 41 y.o. female  Chief Complaint  Patient presents with   Diabetes   Doing great after her gastric sleeve. Feeling great.   DIABETES Hypoglycemic episodes:no Polydipsia/polyuria: no Visual disturbance: no Chest pain: no Paresthesias: no Glucose Monitoring: no  Accucheck frequency: Not Checking Taking Insulin?: no Blood Pressure Monitoring: a few times a month Retinal Examination: Up to Date Foot Exam: Up to Date Diabetic Education: Not Completed Pneumovax: Up to Date Aspirin: no  HYPERTENSION without Chronic Kidney Disease Hypertension status: controlled  Satisfied with current treatment? yes Duration of hypertension: chronic BP monitoring frequency:  a few times a month BP medication side effects:  no Medication compliance: excellent compliance Previous BP meds:amlodipine Aspirin: no Recurrent headaches: no Visual changes: no Palpitations: no Dyspnea: no Chest pain: no Lower extremity edema: no Dizzy/lightheaded: no  Relevant past medical, surgical, family and social history reviewed and updated as indicated. Interim medical history since our last visit reviewed. Allergies and medications reviewed and updated.  Review of Systems  Constitutional: Negative.   Eyes: Negative.   Respiratory: Negative.    Cardiovascular: Negative.   Gastrointestinal: Negative.   Musculoskeletal: Negative.   Neurological: Negative.   Psychiatric/Behavioral: Negative.      Per HPI unless specifically indicated above     Objective:    BP 111/74   Pulse 75   Temp 98.1 F (36.7 C)   Wt 199 lb (90.3 kg)   SpO2 99%   BMI 34.16 kg/m   Wt Readings from Last 3 Encounters:  04/14/22 199 lb (90.3 kg)  12/18/21 232 lb 12.8 oz (105.6 kg)   12/03/21 232 lb 3.2 oz (105.3 kg)    Physical Exam Vitals and nursing note reviewed.  Constitutional:      General: She is not in acute distress.    Appearance: Normal appearance. She is not ill-appearing, toxic-appearing or diaphoretic.  HENT:     Head: Normocephalic and atraumatic.     Right Ear: External ear normal.     Left Ear: External ear normal.     Nose: Nose normal.     Mouth/Throat:     Mouth: Mucous membranes are moist.     Pharynx: Oropharynx is clear.  Eyes:     General: No scleral icterus.       Right eye: No discharge.        Left eye: No discharge.     Extraocular Movements: Extraocular movements intact.     Conjunctiva/sclera: Conjunctivae normal.     Pupils: Pupils are equal, round, and reactive to light.  Cardiovascular:     Rate and Rhythm: Normal rate and regular rhythm.     Pulses: Normal pulses.     Heart sounds: Normal heart sounds. No murmur heard.    No friction rub. No gallop.  Pulmonary:     Effort: Pulmonary effort is normal. No respiratory distress.     Breath sounds: Normal breath sounds. No stridor. No wheezing, rhonchi or rales.  Chest:     Chest wall: No tenderness.  Musculoskeletal:        General: Normal range of motion.     Cervical back: Normal range  of motion and neck supple.  Skin:    General: Skin is warm and dry.     Capillary Refill: Capillary refill takes less than 2 seconds.     Coloration: Skin is not jaundiced or pale.     Findings: No bruising, erythema, lesion or rash.  Neurological:     General: No focal deficit present.     Mental Status: She is alert and oriented to person, place, and time. Mental status is at baseline.  Psychiatric:        Mood and Affect: Mood normal.        Behavior: Behavior normal.        Thought Content: Thought content normal.        Judgment: Judgment normal.     Results for orders placed or performed in visit on 11/02/21  Bayer DCA Hb A1c Waived  Result Value Ref Range   HB A1C  (BAYER DCA - WAIVED) 7.0 (H) 4.8 - 5.6 %      Assessment & Plan:   Problem List Items Addressed This Visit       Cardiovascular and Mediastinum   Essential hypertension    Doing great after gastric sleeve. Will resolve off her problem list.         Endocrine   Uncontrolled type 2 diabetes mellitus with hyperglycemia (HCC) - Primary    Doing great after gastric sleeve. Will resolve off her problem list. A1c of 5.4.        Other   S/P gastric sleeve procedure    Down 30+ pounds already. Doing great. Continue to follow with bariatrics. Call with any concerns.       Other Visit Diagnoses     Candidal intertrigo       Will treat with nystatin. Call with any concerns.    Relevant Medications   nystatin cream (MYCOSTATIN)        Follow up plan: Return in about 3 months (around 07/15/2022).

## 2022-04-14 NOTE — Assessment & Plan Note (Signed)
Doing great after gastric sleeve. Will resolve off her problem list.

## 2022-04-14 NOTE — Assessment & Plan Note (Signed)
Down 30+ pounds already. Doing great. Continue to follow with bariatrics. Call with any concerns.

## 2022-04-15 LAB — CBC WITH DIFFERENTIAL/PLATELET
Basophils Absolute: 0 10*3/uL (ref 0.0–0.2)
Basos: 0 %
EOS (ABSOLUTE): 0.1 10*3/uL (ref 0.0–0.4)
Eos: 2 %
Hematocrit: 38.4 % (ref 34.0–46.6)
Hemoglobin: 12.4 g/dL (ref 11.1–15.9)
Immature Grans (Abs): 0 10*3/uL (ref 0.0–0.1)
Immature Granulocytes: 0 %
Lymphocytes Absolute: 1.4 10*3/uL (ref 0.7–3.1)
Lymphs: 21 %
MCH: 27.3 pg (ref 26.6–33.0)
MCHC: 32.3 g/dL (ref 31.5–35.7)
MCV: 84 fL (ref 79–97)
Monocytes Absolute: 0.4 10*3/uL (ref 0.1–0.9)
Monocytes: 7 %
Neutrophils Absolute: 4.5 10*3/uL (ref 1.4–7.0)
Neutrophils: 70 %
Platelets: 260 10*3/uL (ref 150–450)
RBC: 4.55 x10E6/uL (ref 3.77–5.28)
RDW: 14.5 % (ref 11.7–15.4)
WBC: 6.4 10*3/uL (ref 3.4–10.8)

## 2022-04-15 LAB — COMPREHENSIVE METABOLIC PANEL
ALT: 16 IU/L (ref 0–32)
AST: 14 IU/L (ref 0–40)
Albumin/Globulin Ratio: 2.1 (ref 1.2–2.2)
Albumin: 4.2 g/dL (ref 3.9–4.9)
Alkaline Phosphatase: 60 IU/L (ref 44–121)
BUN/Creatinine Ratio: 12 (ref 9–23)
BUN: 8 mg/dL (ref 6–24)
Bilirubin Total: 0.3 mg/dL (ref 0.0–1.2)
CO2: 21 mmol/L (ref 20–29)
Calcium: 8.8 mg/dL (ref 8.7–10.2)
Chloride: 107 mmol/L — ABNORMAL HIGH (ref 96–106)
Creatinine, Ser: 0.65 mg/dL (ref 0.57–1.00)
Globulin, Total: 2 g/dL (ref 1.5–4.5)
Glucose: 104 mg/dL — ABNORMAL HIGH (ref 70–99)
Potassium: 3.5 mmol/L (ref 3.5–5.2)
Sodium: 143 mmol/L (ref 134–144)
Total Protein: 6.2 g/dL (ref 6.0–8.5)
eGFR: 112 mL/min/{1.73_m2} (ref >59)

## 2022-06-16 DIAGNOSIS — Z9884 Bariatric surgery status: Secondary | ICD-10-CM | POA: Diagnosis not present

## 2022-06-16 DIAGNOSIS — Z713 Dietary counseling and surveillance: Secondary | ICD-10-CM | POA: Diagnosis not present

## 2022-06-16 DIAGNOSIS — K912 Postsurgical malabsorption, not elsewhere classified: Secondary | ICD-10-CM | POA: Diagnosis not present

## 2022-06-16 DIAGNOSIS — E669 Obesity, unspecified: Secondary | ICD-10-CM | POA: Diagnosis not present

## 2022-07-15 ENCOUNTER — Ambulatory Visit: Payer: 59 | Admitting: Family Medicine

## 2022-07-22 ENCOUNTER — Ambulatory Visit: Payer: 59 | Admitting: Family Medicine

## 2022-07-22 ENCOUNTER — Encounter: Payer: Self-pay | Admitting: Family Medicine

## 2022-07-22 VITALS — BP 101/66 | HR 66 | Temp 98.7°F | Wt 187.2 lb

## 2022-07-22 DIAGNOSIS — I10 Essential (primary) hypertension: Secondary | ICD-10-CM | POA: Diagnosis not present

## 2022-07-22 DIAGNOSIS — Z8639 Personal history of other endocrine, nutritional and metabolic disease: Secondary | ICD-10-CM | POA: Insufficient documentation

## 2022-07-22 DIAGNOSIS — E782 Mixed hyperlipidemia: Secondary | ICD-10-CM | POA: Diagnosis not present

## 2022-07-22 LAB — BAYER DCA HB A1C WAIVED: HB A1C (BAYER DCA - WAIVED): 5.6 % (ref 4.8–5.6)

## 2022-07-22 MED ORDER — PANTOPRAZOLE SODIUM 40 MG PO TBEC: 40.0000 mg | DELAYED_RELEASE_TABLET | Freq: Every day | ORAL | 3 refills | Status: DC

## 2022-07-22 NOTE — Progress Notes (Signed)
BP 101/66   Pulse 66   Temp 98.7 F (37.1 C) (Oral)   Wt 187 lb 3.2 oz (84.9 kg)   SpO2 98%   BMI 32.13 kg/m    Subjective:    Patient ID: Bonnie Armstrong, female    DOB: 04-20-1981, 41 y.o.   MRN: [id number]  HPI: Bonnie Armstrong is a 41 y.o. female  Chief Complaint  Patient presents with   Hypertension   Diabetes   HYPERTENSION / [place] Satisfied with current treatment? yes Duration of hypertension: chronic BP monitoring frequency: rarely BP medication side effects: N/A Duration of hyperlipidemia: chronic Cholesterol medication side effects: N/A Cholesterol supplements: none Past cholesterol medications: none Medication compliance: not on anything since gastric sleeve Aspirin: no Recent stressors: no Recurrent headaches: no Visual changes: no Palpitations: no Dyspnea: no Chest pain: no Lower extremity edema: no Dizzy/lightheaded: no  HISTORY OF DIABETES Hypoglycemic episodes:no Polydipsia/polyuria: no Visual disturbance: no Chest pain: no Paresthesias: no Glucose Monitoring: no  Accucheck frequency: Not Checking Taking Insulin?: no Blood Pressure Monitoring: rarely Retinal Examination: Not up to Date Foot Exam: N/A Diabetic Education: Completed Pneumovax: Up to Date Influenza: Not up to Date Aspirin: no   Relevant past medical, surgical, family and social history reviewed and updated as indicated. Interim medical history since our last visit reviewed. Allergies and medications reviewed and updated.  Review of Systems  Constitutional: Negative.   Respiratory: Negative.    Cardiovascular: Negative.   Gastrointestinal: Negative.   Musculoskeletal: Negative.   Neurological: Negative.   Psychiatric/Behavioral: Negative.      Per HPI unless specifically indicated above     Objective:    BP 101/66   Pulse 66   Temp 98.7 F (37.1 C) (Oral)   Wt 187 lb 3.2 oz (84.9 kg)   SpO2 98%   BMI 32.13 kg/m   Wt Readings from Last 3  Encounters:  07/22/22 187 lb 3.2 oz (84.9 kg)  04/14/22 199 lb (90.3 kg)  12/18/21 232 lb 12.8 oz (105.6 kg)    Physical Exam Vitals and nursing note reviewed.  Constitutional:      General: She is not in acute distress.    Appearance: Normal appearance. She is not ill-appearing, toxic-appearing or diaphoretic.  HENT:     Head: Normocephalic and atraumatic.     Right Ear: External ear normal.     Left Ear: External ear normal.     Nose: Nose normal.     Mouth/Throat:     Mouth: Mucous membranes are moist.     Pharynx: Oropharynx is clear.  Eyes:     General: No scleral icterus.       Right eye: No discharge.        Left eye: No discharge.     Extraocular Movements: Extraocular movements intact.     Conjunctiva/sclera: Conjunctivae normal.     Pupils: Pupils are equal, round, and reactive to light.  Cardiovascular:     Rate and Rhythm: Normal rate and regular rhythm.     Pulses: Normal pulses.     Heart sounds: Normal heart sounds. No murmur heard.    No friction rub. No gallop.  Pulmonary:     Effort: Pulmonary effort is normal. No respiratory distress.     Breath sounds: Normal breath sounds. No stridor. No wheezing, rhonchi or rales.  Chest:     Chest wall: No tenderness.  Musculoskeletal:        General: Normal range of motion.  Cervical back: Normal range of motion and neck supple.  Skin:    General: Skin is warm and dry.     Capillary Refill: Capillary refill takes less than 2 seconds.     Coloration: Skin is not jaundiced or pale.     Findings: No bruising, erythema, lesion or rash.  Neurological:     General: No focal deficit present.     Mental Status: She is alert and oriented to person, place, and time. Mental status is at baseline.  Psychiatric:        Mood and Affect: Mood normal.        Behavior: Behavior normal.        Thought Content: Thought content normal.        Judgment: Judgment normal.     Results for orders placed or performed in visit on  04/14/22  Comprehensive metabolic panel  Result Value Ref Range   Glucose 104 (H) 70 - 99 mg/dL   BUN 8 6 - 24 mg/dL   Creatinine, Ser 0.65 0.57 - 1.00 mg/dL   eGFR 112 >59 mL/min/1.73   BUN/Creatinine Ratio 12 9 - 23   Sodium 143 134 - 144 mmol/L   Potassium 3.5 3.5 - 5.2 mmol/L   Chloride 107 (H) 96 - 106 mmol/L   CO2 21 20 - 29 mmol/L   Calcium 8.8 8.7 - 10.2 mg/dL   Total Protein 6.2 6.0 - 8.5 g/dL   Albumin 4.2 3.9 - 4.9 g/dL   Globulin, Total 2.0 1.5 - 4.5 g/dL   Albumin/Globulin Ratio 2.1 1.2 - 2.2   Bilirubin Total 0.3 0.0 - 1.2 mg/dL   Alkaline Phosphatase 60 44 - 121 IU/L   AST 14 0 - 40 IU/L   ALT 16 0 - 32 IU/L  Bayer DCA Hb A1c Waived  Result Value Ref Range   HB A1C (BAYER DCA - WAIVED) 5.4 4.8 - 5.6 %  CBC with Differential/Platelet  Result Value Ref Range   WBC 6.4 3.4 - 10.8 x10E3/uL   RBC 4.55 3.77 - 5.28 x10E6/uL   Hemoglobin 12.4 11.1 - 15.9 g/dL   Hematocrit 38.4 34.0 - 46.6 %   MCV 84 79 - 97 fL   MCH 27.3 26.6 - 33.0 pg   MCHC 32.3 31.5 - 35.7 g/dL   RDW 14.5 11.7 - 15.4 %   Platelets 260 150 - 450 x10E3/uL   Neutrophils 70 Not Estab. %   Lymphs 21 Not Estab. %   Monocytes 7 Not Estab. %   Eos 2 Not Estab. %   Basos 0 Not Estab. %   Neutrophils Absolute 4.5 1.4 - 7.0 x10E3/uL   Lymphocytes Absolute 1.4 0.7 - 3.1 x10E3/uL   Monocytes Absolute 0.4 0.1 - 0.9 x10E3/uL   EOS (ABSOLUTE) 0.1 0.0 - 0.4 x10E3/uL   Basophils Absolute 0.0 0.0 - 0.2 x10E3/uL   Immature Granulocytes 0 Not Estab. %   Immature Grans (Abs) 0.0 0.0 - 0.1 x10E3/uL      Assessment & Plan:   Problem List Items Addressed This Visit       Cardiovascular and Mediastinum   Essential hypertension    Doing great off medicine. Continue diet and exercise. Call with any concerns.         Other   Hyperlipidemia, mixed    Rechecking labs today. Await results. Treat as needed.       Relevant Orders   Comprehensive metabolic panel   Lipid Panel w/o Chol/HDL Ratio   History  of  diabetes mellitus - Primary    Doing great with A1c of 5.6. Continue diet and exercise. Call with any concerns.       Relevant Orders   Bayer DCA Hb A1c Waived     Follow up plan: Return in about 6 months (around 01/21/2023) for physical.

## 2022-07-22 NOTE — Assessment & Plan Note (Signed)
Doing great off medicine. Continue diet and exercise. Call with any concerns.

## 2022-07-22 NOTE — Assessment & Plan Note (Signed)
Doing great with A1c of 5.6. Continue diet and exercise. Call with any concerns. ?

## 2022-07-22 NOTE — Assessment & Plan Note (Signed)
Rechecking labs today. Await results. Treat as needed.  °

## 2022-07-23 LAB — COMPREHENSIVE METABOLIC PANEL
ALT: 23 IU/L (ref 0–32)
AST: 20 IU/L (ref 0–40)
Albumin/Globulin Ratio: 2 (ref 1.2–2.2)
Albumin: 4.1 g/dL (ref 3.9–4.9)
Alkaline Phosphatase: 76 IU/L (ref 44–121)
BUN/Creatinine Ratio: 21 (ref 9–23)
BUN: 13 mg/dL (ref 6–24)
Bilirubin Total: 0.3 mg/dL (ref 0.0–1.2)
CO2: 24 mmol/L (ref 20–29)
Calcium: 8.8 mg/dL (ref 8.7–10.2)
Chloride: 105 mmol/L (ref 96–106)
Creatinine, Ser: 0.63 mg/dL (ref 0.57–1.00)
Globulin, Total: 2.1 g/dL (ref 1.5–4.5)
Glucose: 93 mg/dL (ref 70–99)
Potassium: 3.8 mmol/L (ref 3.5–5.2)
Sodium: 142 mmol/L (ref 134–144)
Total Protein: 6.2 g/dL (ref 6.0–8.5)
eGFR: 113 mL/min/{1.73_m2} (ref >59)

## 2022-07-23 LAB — LIPID PANEL W/O CHOL/HDL RATIO
Cholesterol, Total: 135 mg/dL (ref 100–199)
HDL: 47 mg/dL (ref >39)
LDL Chol Calc (NIH): 75 mg/dL (ref 0–99)
Triglycerides: 61 mg/dL (ref 0–149)
VLDL Cholesterol Cal: 13 mg/dL (ref 5–40)

## 2022-09-13 DIAGNOSIS — Z9884 Bariatric surgery status: Secondary | ICD-10-CM | POA: Diagnosis not present

## 2022-09-13 DIAGNOSIS — K912 Postsurgical malabsorption, not elsewhere classified: Secondary | ICD-10-CM | POA: Diagnosis not present

## 2022-09-13 DIAGNOSIS — Z713 Dietary counseling and surveillance: Secondary | ICD-10-CM | POA: Diagnosis not present

## 2022-09-13 DIAGNOSIS — E669 Obesity, unspecified: Secondary | ICD-10-CM | POA: Diagnosis not present

## 2022-10-28 ENCOUNTER — Encounter: Payer: Self-pay | Admitting: Family Medicine

## 2022-12-08 ENCOUNTER — Encounter: Payer: Self-pay | Admitting: Family Medicine

## 2022-12-22 NOTE — Telephone Encounter (Signed)
appt

## 2022-12-23 NOTE — Telephone Encounter (Signed)
Called patient to get scheduled and she stated that she is going to hold off on trying some weight loss medications.  That she has received her labs back from the bariatric clinic.  Her prealbumin is extremely low and the clinic is worried that if she goes on weight loss meds that it would put her body into malnutrition.  She stated that they have her on Juven powder and she is eating 100-120 grams of protein a day.  They are wanting her to follow up with them in 3 months to recheck labs and if her prealbumin is stabilized then she may want to discuss weight loss medications.

## 2022-12-30 ENCOUNTER — Ambulatory Visit: Payer: Self-pay

## 2022-12-30 ENCOUNTER — Encounter: Payer: Self-pay | Admitting: Family Medicine

## 2022-12-30 ENCOUNTER — Ambulatory Visit: Payer: 59 | Admitting: Family Medicine

## 2022-12-30 VITALS — BP 99/62 | HR 67 | Temp 98.6°F | Wt 185.4 lb

## 2022-12-30 DIAGNOSIS — B372 Candidiasis of skin and nail: Secondary | ICD-10-CM

## 2022-12-30 DIAGNOSIS — N3000 Acute cystitis without hematuria: Secondary | ICD-10-CM | POA: Diagnosis not present

## 2022-12-30 DIAGNOSIS — B9689 Other specified bacterial agents as the cause of diseases classified elsewhere: Secondary | ICD-10-CM | POA: Diagnosis not present

## 2022-12-30 DIAGNOSIS — Z903 Acquired absence of stomach [part of]: Secondary | ICD-10-CM | POA: Diagnosis not present

## 2022-12-30 DIAGNOSIS — Z8639 Personal history of other endocrine, nutritional and metabolic disease: Secondary | ICD-10-CM

## 2022-12-30 DIAGNOSIS — R300 Dysuria: Secondary | ICD-10-CM

## 2022-12-30 DIAGNOSIS — N760 Acute vaginitis: Secondary | ICD-10-CM

## 2022-12-30 LAB — URINALYSIS, ROUTINE W REFLEX MICROSCOPIC
Bilirubin, UA: NEGATIVE
Glucose, UA: NEGATIVE
Ketones, UA: NEGATIVE
Nitrite, UA: NEGATIVE
Protein,UA: NEGATIVE
RBC, UA: NEGATIVE
Specific Gravity, UA: 1.02 (ref 1.005–1.030)
Urobilinogen, Ur: 0.2 mg/dL (ref 0.2–1.0)
pH, UA: 7 (ref 5.0–7.5)

## 2022-12-30 LAB — WET PREP FOR TRICH, YEAST, CLUE
Clue Cell Exam: POSITIVE — AB
Trichomonas Exam: NEGATIVE
Yeast Exam: NEGATIVE

## 2022-12-30 LAB — MICROSCOPIC EXAMINATION: Bacteria, UA: NONE SEEN

## 2022-12-30 LAB — MICROALBUMIN, URINE WAIVED
Creatinine, Urine Waived: 100 mg/dL (ref 10–300)
Microalb, Ur Waived: 30 mg/L — ABNORMAL HIGH (ref 0–19)

## 2022-12-30 MED ORDER — METRONIDAZOLE 500 MG PO TABS: 500.0000 mg | ORAL_TABLET | Freq: Two times a day (BID) | ORAL | 0 refills | Status: DC

## 2022-12-30 MED ORDER — CIPROFLOXACIN HCL 500 MG PO TABS: 500.0000 mg | ORAL_TABLET | Freq: Two times a day (BID) | ORAL | 0 refills | Status: AC

## 2022-12-30 NOTE — Assessment & Plan Note (Signed)
Microalbumin checked today.  

## 2022-12-30 NOTE — Telephone Encounter (Signed)
  Chief Complaint: Frequency urgency burning with urination, minor abdominal discomfort Symptoms: above Frequency: 2 days Pertinent Negatives: Patient denies fever Disposition: [] ED /[] Urgent Care (no appt availability in office) / [x] Appointment(In office/virtual)/ []  [facility]/ [] Home Care/ [] Refused Recommended Disposition /[] [facility]/ []  Follow-up with PCP Additional Notes: Pt has urinary frequency, urgency and burning for the past 2 days. Pt also reports mild abdominal discomfort.    Reason for Disposition  Urinating more frequently than usual (i.e., frequency)  Answer Assessment - Initial Assessment Questions 1. SYMPTOM: "What's the main symptom you're concerned about?" (e.g., frequency, incontinence)     UTI 2. ONSET: "When did the    start?"     48 hours 3. PAIN: "Is there any pain?" If Yes, ask: "How bad is it?" (Scale: 1-10; mild, moderate, severe)     Yes 4. CAUSE: "What do you think is causing the symptoms?"     UTI 5. OTHER SYMPTOMS: "Do you have any other symptoms?" (e.g., blood in urine, fever, flank pain, pain with urination)     Frequency, urgency, burning and pressure/cramping  Protocols used: Urinary Symptoms-A-AH

## 2022-12-30 NOTE — Progress Notes (Signed)
BP 99/62   Pulse 67   Temp 98.6 F (37 C) (Oral)   Wt 185 lb 6.4 oz (84.1 kg)   SpO2 100%   BMI 31.82 kg/m    Subjective:    Patient ID: Bonnie Armstrong, female    DOB: [birth year], 42 y.o.   MRN: [id number]  HPI: Bonnie Armstrong is a 42 y.o. female  Chief Complaint  Patient presents with   Urinary Tract Infection    Patient says she has noticed some burning with urination and pressure in the abdomen. Patient has tried over the counter medication and drank plenty of water. Patient says she became symptomatic about 48 hours ago.    URINARY SYMPTOMS Duration: 2 days Dysuria: burning Urinary frequency: yes Urgency: yes Small volume voids: no Symptom severity: no Urinary incontinence: no Foul odor: yes Hematuria: no Abdominal pain: yes Back pain: no Suprapubic pain/pressure: yes Flank pain: no Fever:  no Vomiting: no Relief with cranberry juice: no Relief with pyridium: no Status: worse Previous urinary tract infection: yes Recurrent urinary tract infection: no Sexual activity: monogomous History of sexually transmitted disease: no Vaginal discharge: no Treatments attempted: pyridium and increasing fluids   Has continued with yeast infections under her pannus. Has had nearly daily issues that she deals with with nystatin and gold-bond. She would like to see plastic to talk about a possible panniculectomy. No other concerns or complaints.   Relevant past medical, surgical, family and social history reviewed and updated as indicated. Interim medical history since our last visit reviewed. Allergies and medications reviewed and updated.  Review of Systems  Constitutional: Negative.   Respiratory: Negative.    Cardiovascular: Negative.   Gastrointestinal: Negative.   Genitourinary:  Positive for dysuria, frequency and urgency. Negative for decreased urine volume, difficulty urinating, dyspareunia, enuresis, flank pain, genital sores, hematuria, menstrual problem,  pelvic pain, vaginal bleeding, vaginal discharge and vaginal pain.  Musculoskeletal: Negative.   Neurological: Negative.   Psychiatric/Behavioral: Negative.      Per HPI unless specifically indicated above     Objective:    BP 99/62   Pulse 67   Temp 98.6 F (37 C) (Oral)   Wt 185 lb 6.4 oz (84.1 kg)   SpO2 100%   BMI 31.82 kg/m   Wt Readings from Last 3 Encounters:  12/30/22 185 lb 6.4 oz (84.1 kg)  07/22/22 187 lb 3.2 oz (84.9 kg)  04/14/22 199 lb (90.3 kg)    Physical Exam Vitals and nursing note reviewed.  Constitutional:      General: She is not in acute distress.    Appearance: Normal appearance. She is not ill-appearing, toxic-appearing or diaphoretic.  HENT:     Head: Normocephalic and atraumatic.     Right Ear: External ear normal.     Left Ear: External ear normal.     Nose: Nose normal.     Mouth/Throat:     Mouth: Mucous membranes are moist.     Pharynx: Oropharynx is clear.  Eyes:     General: No scleral icterus.       Right eye: No discharge.        Left eye: No discharge.     Extraocular Movements: Extraocular movements intact.     Conjunctiva/sclera: Conjunctivae normal.     Pupils: Pupils are equal, round, and reactive to light.  Cardiovascular:     Rate and Rhythm: Normal rate and regular rhythm.     Pulses: Normal pulses.     Heart  sounds: Normal heart sounds. No murmur heard.    No friction rub. No gallop.  Pulmonary:     Effort: Pulmonary effort is normal. No respiratory distress.     Breath sounds: Normal breath sounds. No stridor. No wheezing, rhonchi or rales.  Chest:     Chest wall: No tenderness.  Musculoskeletal:        General: Normal range of motion.     Cervical back: Normal range of motion and neck supple.  Skin:    General: Skin is warm and dry.     Capillary Refill: Capillary refill takes less than 2 seconds.     Coloration: Skin is not jaundiced or pale.     Findings: No bruising, erythema, lesion or rash.  Neurological:      General: No focal deficit present.     Mental Status: She is alert and oriented to person, place, and time. Mental status is at baseline.  Psychiatric:        Mood and Affect: Mood normal.        Behavior: Behavior normal.        Thought Content: Thought content normal.        Judgment: Judgment normal.     Results for orders placed or performed in visit on 07/22/22  Bayer DCA Hb A1c Waived  Result Value Ref Range   HB A1C (BAYER DCA - WAIVED) 5.6 4.8 - 5.6 %  Comprehensive metabolic panel  Result Value Ref Range   Glucose 93 70 - 99 mg/dL   BUN 13 6 - 24 mg/dL   Creatinine, Ser 1.61 0.57 - 1.00 mg/dL   eGFR 096 >04 VW/UJW/1.19   BUN/Creatinine Ratio 21 9 - 23   Sodium 142 134 - 144 mmol/L   Potassium 3.8 3.5 - 5.2 mmol/L   Chloride 105 96 - 106 mmol/L   CO2 24 20 - 29 mmol/L   Calcium 8.8 8.7 - 10.2 mg/dL   Total Protein 6.2 6.0 - 8.5 g/dL   Albumin 4.1 3.9 - 4.9 g/dL   Globulin, Total 2.1 1.5 - 4.5 g/dL   Albumin/Globulin Ratio 2.0 1.2 - 2.2   Bilirubin Total 0.3 0.0 - 1.2 mg/dL   Alkaline Phosphatase 76 44 - 121 IU/L   AST 20 0 - 40 IU/L   ALT 23 0 - 32 IU/L  Lipid Panel w/o Chol/HDL Ratio  Result Value Ref Range   Cholesterol, Total 135 100 - 199 mg/dL   Triglycerides 61 0 - 149 mg/dL   HDL 47 >14 mg/dL   VLDL Cholesterol Cal 13 5 - 40 mg/dL   LDL Chol Calc (NIH) 75 0 - 99 mg/dL      Assessment & Plan:   Problem List Items Addressed This Visit       Other   S/P gastric sleeve procedure    Has had issues with recurrent skin infections and break down. Continue nyastatin as needed. Referral to plastics placed today. Call with any concerns.       Relevant Orders   Ambulatory referral to Plastic Surgery   History of diabetes mellitus    Microalbumin checked today.       Relevant Orders   Microalbumin, Urine Waived   Other Visit Diagnoses     Acute cystitis without hematuria    -  Primary   Will treat with cipro. Await culture. Call with any  concerns.   Relevant Orders   Urine Culture   Dysuria       +  UTI and BV, will check.   Relevant Orders   Urinalysis, Routine w reflex microscopic   WET PREP FOR TRICH, YEAST, CLUE   Candidal intertrigo       Continue nyastatin as needed. Referral to plastics placed today. Call with any concerns.   Relevant Medications   metroNIDAZOLE (FLAGYL) 500 MG tablet   Other Relevant Orders   Ambulatory referral to Plastic Surgery   BV (bacterial vaginosis)       Will treat with flagyl. Call with any concerns.   Relevant Medications   metroNIDAZOLE (FLAGYL) 500 MG tablet        Follow up plan: Return if symptoms worsen or fail to improve.

## 2022-12-30 NOTE — Assessment & Plan Note (Signed)
Has had issues with recurrent skin infections and break down. Continue nyastatin as needed. Referral to plastics placed today. Call with any concerns.

## 2023-01-02 LAB — URINE CULTURE: Organism ID, Bacteria: NO GROWTH

## 2023-01-24 ENCOUNTER — Encounter: Payer: 59 | Admitting: Family Medicine

## 2023-02-28 DIAGNOSIS — Z713 Dietary counseling and surveillance: Secondary | ICD-10-CM | POA: Diagnosis not present

## 2023-03-10 DIAGNOSIS — K912 Postsurgical malabsorption, not elsewhere classified: Secondary | ICD-10-CM | POA: Diagnosis not present

## 2023-03-10 DIAGNOSIS — Z9884 Bariatric surgery status: Secondary | ICD-10-CM | POA: Diagnosis not present

## 2023-03-10 DIAGNOSIS — Z713 Dietary counseling and surveillance: Secondary | ICD-10-CM | POA: Diagnosis not present

## 2023-03-10 DIAGNOSIS — E669 Obesity, unspecified: Secondary | ICD-10-CM | POA: Diagnosis not present

## 2023-04-01 ENCOUNTER — Institutional Professional Consult (permissible substitution): Payer: 59 | Admitting: Plastic Surgery

## 2023-04-05 ENCOUNTER — Institutional Professional Consult (permissible substitution): Payer: 59 | Admitting: Plastic Surgery

## 2023-05-05 ENCOUNTER — Encounter: Payer: 59 | Admitting: Family Medicine

## 2023-05-12 ENCOUNTER — Other Ambulatory Visit: Payer: Self-pay | Admitting: Family Medicine

## 2023-05-12 NOTE — Telephone Encounter (Signed)
Requested medication (s) are due for refill today: yes  Requested medication (s) are on the active medication list: yes    Last refill: 04/14/22  30g  6 refills  Future visit scheduled no  Notes to clinic:Off protocol, please review. Thank you.  Requested Prescriptions  Pending Prescriptions Disp Refills   nystatin cream (MYCOSTATIN) [Pharmacy Med Name: NYSTATIN 100,000 UNIT/GM CREAM] 30 g 6    Sig: APPLY TO AFFECTED AREA TWICE A DAY     Off-Protocol Failed - 05/12/2023 11:09 AM      Failed - Medication not assigned to a protocol, review manually.      Passed - Valid encounter within last 12 months    Recent Outpatient Visits           4 months ago Acute cystitis without hematuria   [place] [facility] [place], Megan P, DO   9 months ago History of diabetes mellitus   [facility] [place], Megan P, DO   1 year ago History of diabetes mellitus   [facility] [place], Megan P, DO   1 year ago Morbid obesity [facility])   [facility] [place], Megan P, DO   1 year ago Uncontrolled type 2 diabetes mellitus with hyperglycemia [facility])    [facility] [place], Megan P, DO

## 2023-06-29 DIAGNOSIS — Z9884 Bariatric surgery status: Secondary | ICD-10-CM | POA: Diagnosis not present

## 2023-06-29 DIAGNOSIS — K912 Postsurgical malabsorption, not elsewhere classified: Secondary | ICD-10-CM | POA: Diagnosis not present

## 2023-06-29 DIAGNOSIS — Z713 Dietary counseling and surveillance: Secondary | ICD-10-CM | POA: Diagnosis not present

## 2023-06-30 DIAGNOSIS — D508 Other iron deficiency anemias: Secondary | ICD-10-CM | POA: Diagnosis not present

## 2023-06-30 DIAGNOSIS — Z9884 Bariatric surgery status: Secondary | ICD-10-CM | POA: Diagnosis not present

## 2023-06-30 DIAGNOSIS — E669 Obesity, unspecified: Secondary | ICD-10-CM | POA: Diagnosis not present

## 2023-06-30 DIAGNOSIS — E119 Type 2 diabetes mellitus without complications: Secondary | ICD-10-CM | POA: Diagnosis not present

## 2023-06-30 DIAGNOSIS — Z713 Dietary counseling and surveillance: Secondary | ICD-10-CM | POA: Diagnosis not present

## 2023-06-30 DIAGNOSIS — K912 Postsurgical malabsorption, not elsewhere classified: Secondary | ICD-10-CM | POA: Diagnosis not present

## 2023-07-05 ENCOUNTER — Institutional Professional Consult (permissible substitution): Payer: 59 | Admitting: Plastic Surgery

## 2023-08-08 DIAGNOSIS — J189 Pneumonia, unspecified organism: Secondary | ICD-10-CM | POA: Diagnosis not present

## 2023-08-16 ENCOUNTER — Institutional Professional Consult (permissible substitution): Payer: 59 | Admitting: Plastic Surgery

## 2023-09-16 ENCOUNTER — Ambulatory Visit (INDEPENDENT_AMBULATORY_CARE_PROVIDER_SITE_OTHER): Payer: Self-pay | Admitting: Plastic Surgery

## 2023-09-16 ENCOUNTER — Encounter: Payer: Self-pay | Admitting: Plastic Surgery

## 2023-09-16 VITALS — BP 144/92 | HR 80 | Ht 63.5 in | Wt 195.6 lb

## 2023-09-16 DIAGNOSIS — M793 Panniculitis, unspecified: Secondary | ICD-10-CM

## 2023-09-16 DIAGNOSIS — R21 Rash and other nonspecific skin eruption: Secondary | ICD-10-CM

## 2023-09-16 DIAGNOSIS — Z9884 Bariatric surgery status: Secondary | ICD-10-CM | POA: Diagnosis not present

## 2023-09-16 NOTE — Progress Notes (Addendum)
Patient ID: Bonnie Armstrong, female    DOB: 1980/12/04, 43 y.o.   MRN: [id number]   Chief Complaint  Patient presents with   Advice Only   Skin Problem    The patient is a 43 year old female here for consultation for panniculectomy.  The patient is 5 feet 3 inches tall weighs 195 pounds.  She has decreased her weight from 235 pounds with bariatric surgery done in 2023.  Her weight seems to be quite stable over this past year.  She has a lot of rashes and skin breakdown some were noted today.  She was able to decrease her hemoglobin A1c the latest was 5.6 in 2023.  She is not a smoker her past surgical history is positive for C-section as well as the gastric bypass.  Dealing with the skin breakdown she has tried creams and lotions including nystatin.  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  She would like to see if there is anything that can be done to decrease the pannus to take care of the skin breakdown.  Her stomach muscles are nice and tight even given as many children's she has had.     Review of Systems  Constitutional: Negative.   HENT: Negative.    Eyes: Negative.   Respiratory: Negative.    Cardiovascular: Negative.   Gastrointestinal: Negative.   Endocrine: Negative.   Genitourinary: Negative.   Musculoskeletal: Negative.     Past Medical History:  Diagnosis Date   Gestational diabetes    IFG (impaired fasting glucose)    Morbid obesity (HCC) 02/28/2015   Prediabetes     Past Surgical History:  Procedure Laterality Date   BARIATRIC SURGERY  02/26/2022   CESAREAN SECTION  08/16/2000   WISDOM TOOTH EXTRACTION        Current Outpatient Medications:    CALCIUM CITRATE PO, Take 1 tablet by mouth 3 (three) times daily., Disp: , Rfl:    Multiple Vitamin (MULTI-VITAMIN) tablet, Take 1 tablet by mouth daily., Disp: , Rfl:    nystatin cream (MYCOSTATIN), APPLY TO AFFECTED AREA TWICE A DAY,  Disp: 30 g, Rfl: 6   phentermine 15 MG capsule, Take 15 mg by mouth every morning., Disp: , Rfl:    topiramate (TOPAMAX) 25 MG tablet, Take 25 mg by mouth 2 (two) times daily., Disp: , Rfl:    Objective:   Vitals:   09/16/23 1150  BP: (!) 144/92  Pulse: 80  SpO2: 100%    Physical Exam Constitutional:      Appearance: Normal appearance.  Cardiovascular:     Rate and Rhythm: Normal rate.     Pulses: Normal pulses.  Pulmonary:     Effort: Pulmonary effort is normal.  Skin:    General: Skin is warm.  Neurological:     Mental Status: She is alert and oriented to person, place, and time.  Psychiatric:        Mood and Affect: Mood normal.        Behavior: Behavior normal.        Thought Content: Thought content normal.        Judgment: Judgment normal.     Assessment & Plan:  Panniculitis  The patient is a good candidate for a panniculectomy.  She understands that there is a big difference between an abdominoplasty and a panniculectomy.  She would like to move ahead with seeing if she can get  insurance to cover for her.  Pictures were obtained of the patient and placed in the chart with the patient's or guardian's permission.  09/28/23: request for monsplasty added  Alena Bills Perri Lamagna, DO

## 2023-09-17 ENCOUNTER — Encounter: Payer: Self-pay | Admitting: Plastic Surgery

## 2023-09-28 NOTE — Addendum Note (Signed)
Addended by: Peggye Form on: 09/28/2023 07:58 AM   Modules accepted: Orders

## 2023-10-03 ENCOUNTER — Encounter: Payer: Self-pay | Admitting: Plastic Surgery

## 2023-10-04 NOTE — Telephone Encounter (Signed)
That's fine if that's okay with Dr. Ulice Bold and patient

## 2023-10-10 ENCOUNTER — Encounter: Payer: Self-pay | Admitting: Family Medicine

## 2023-10-10 ENCOUNTER — Ambulatory Visit (INDEPENDENT_AMBULATORY_CARE_PROVIDER_SITE_OTHER): Payer: 59 | Admitting: Family Medicine

## 2023-10-10 ENCOUNTER — Other Ambulatory Visit: Payer: Self-pay | Admitting: Family Medicine

## 2023-10-10 VITALS — BP 126/75 | HR 73 | Temp 98.3°F | Ht 63.0 in | Wt 189.2 lb

## 2023-10-10 DIAGNOSIS — Z0000 Encounter for general adult medical examination without abnormal findings: Secondary | ICD-10-CM | POA: Diagnosis not present

## 2023-10-10 DIAGNOSIS — N898 Other specified noninflammatory disorders of vagina: Secondary | ICD-10-CM

## 2023-10-10 DIAGNOSIS — Z8639 Personal history of other endocrine, nutritional and metabolic disease: Secondary | ICD-10-CM | POA: Diagnosis not present

## 2023-10-10 DIAGNOSIS — E782 Mixed hyperlipidemia: Secondary | ICD-10-CM | POA: Diagnosis not present

## 2023-10-10 DIAGNOSIS — I10 Essential (primary) hypertension: Secondary | ICD-10-CM

## 2023-10-10 DIAGNOSIS — Z1231 Encounter for screening mammogram for malignant neoplasm of breast: Secondary | ICD-10-CM | POA: Diagnosis not present

## 2023-10-10 DIAGNOSIS — N920 Excessive and frequent menstruation with regular cycle: Secondary | ICD-10-CM | POA: Diagnosis not present

## 2023-10-10 LAB — MICROALBUMIN, URINE WAIVED
Creatinine, Urine Waived: 10 mg/dL (ref 10–300)
Microalb, Ur Waived: 10 mg/L (ref 0–19)
Microalb/Creat Ratio: <30 mg/g (ref <30)

## 2023-10-10 LAB — WET PREP FOR TRICH, YEAST, CLUE
Clue Cell Exam: POSITIVE — AB
Trichomonas Exam: NEGATIVE
Yeast Exam: POSITIVE — AB

## 2023-10-10 LAB — BAYER DCA HB A1C WAIVED: HB A1C (BAYER DCA - WAIVED): 5.3 % (ref 4.8–5.6)

## 2023-10-10 MED ORDER — FLUCONAZOLE 150 MG PO TABS: 150.0000 mg | ORAL_TABLET | Freq: Every day | ORAL | 0 refills | Status: DC

## 2023-10-10 MED ORDER — METRONIDAZOLE 0.75 % VA GEL: 1.0000 | Freq: Every day | VAGINAL | 0 refills | Status: DC

## 2023-10-10 NOTE — Patient Instructions (Signed)
 Please call to schedule your mammogram and/or bone density: [facility]  Address: [address], [place], [place] [address] Phone: [phone]  [facility] [address] [place],  [place]  [address] Phone: [phone]

## 2023-10-10 NOTE — Progress Notes (Signed)
 BP 126/75 (BP Location: Left Arm, Patient Position: Sitting)   Pulse 73   Temp 98.3 F (36.8 C) (Oral)   Ht 5\' 3"  (1.6 m)   Wt 189 lb 3.2 oz (85.8 kg)   LMP 09/22/2023 (Exact Date)   SpO2 100%   BMI 33.52 kg/m    Subjective:    Patient ID: Bonnie Armstrong, female    DOB: 01-Apr-1981, 43 y.o.   MRN: [id number]  HPI: Bonnie Armstrong is a 43 y.o. female presenting on 10/10/2023 for comprehensive medical examination. Current medical complaints include:  ANEMIA Anemia status: uncontrolled Etiology of anemia:iron def Duration of anemia treatment: about 3 month Compliance with treatment: excellent compliance Iron supplementation side effects: no Severity of anemia: moderate Fatigue: yes Decreased exercise tolerance: no  Dyspnea on exertion: no Palpitations: no Bleeding: yes Pica: no  HYPERTENSION / HYPERLIPIDEMIA Satisfied with current treatment? yes Duration of hypertension:  has been off everything for months since her surgery BP monitoring frequency: rarely BP medication side effects: none Past BP meds: metoprolol, amlodipine Duration of hyperlipidemia: has been off everything for months since her surgery Cholesterol medication side effects: no Cholesterol supplements: none Past cholesterol medications: crestor Medication compliance: excellent compliance Aspirin: no Recent stressors: yes Recurrent headaches: no Visual changes: no Palpitations: no Dyspnea: no Chest pain: no Lower extremity edema: no Dizzy/lightheaded: no  She currently lives with: husband and kids Menopausal Symptoms: no  Depression Screen done today and results listed below:     10/10/2023    3:02 PM 12/30/2022    3:19 PM 07/22/2022   10:12 AM 12/03/2021    9:40 AM 11/02/2021    2:22 PM  Depression screen PHQ 2/9  Decreased Interest 0 0 0 0 0  Down, Depressed, Hopeless 0 0 0 0 0  PHQ - 2 Score 0 0 0 0 0  Altered sleeping 0 0 0 0 0  Tired, decreased energy 0 1 0 0 0  Change in appetite  0 0 0 0 0  Feeling bad or failure about yourself  0 0 0 0 0  Trouble concentrating 0 0 0 0 0  Moving slowly or fidgety/restless 0 0 0 0 0  Suicidal thoughts 0 0 0 0 0  PHQ-9 Score 0 1 0 0 0  Difficult doing work/chores  Not difficult at all Not difficult at all  Not difficult at all    Past Medical History:  Past Medical History:  Diagnosis Date   Gestational diabetes    IFG (impaired fasting glucose)    Morbid obesity (HCC) 02/28/2015   Prediabetes     Surgical History:  Past Surgical History:  Procedure Laterality Date   BARIATRIC SURGERY  02/26/2022   CESAREAN SECTION  08/16/2000   WISDOM TOOTH EXTRACTION      Medications:  Current Outpatient Medications on File Prior to Visit  Medication Sig   CALCIUM CITRATE PO Take 1 tablet by mouth 3 (three) times daily.   Multiple Vitamin (MULTI-VITAMIN) tablet Take 1 tablet by mouth daily.   nystatin cream (MYCOSTATIN) APPLY TO AFFECTED AREA TWICE A DAY   phentermine 15 MG capsule Take 15 mg by mouth every morning.   topiramate (TOPAMAX) 25 MG tablet Take 25 mg by mouth 2 (two) times daily.   No current facility-administered medications on file prior to visit.    Allergies:  No Known Allergies  Social History:  Social History   Socioeconomic History   Marital status: Married    Spouse name: Not  on file   Number of children: Not on file   Years of education: Not on file   Highest education level: Not on file  Occupational History   Not on file  Tobacco Use   Smoking status: Never   Smokeless tobacco: Never  Vaping Use   Vaping status: Never Used  Substance and Sexual Activity   Alcohol use: No   Drug use: No   Sexual activity: Yes    Birth control/protection: None  Other Topics Concern   Not on file  Social History Narrative   Not on file   Social Drivers of Health   Financial Resource Strain: Low Risk  (04/18/2020)   Overall Financial Resource Strain (CARDIA)    Difficulty of Paying Living Expenses: Not  hard at all  Food Insecurity: No Food Insecurity (04/18/2020)   Hunger Vital Sign    Worried About Running Out of Food in the Last Year: Never true    Ran Out of Food in the Last Year: Never true  Transportation Needs: No Transportation Needs (04/18/2020)   PRAPARE - Administrator, Civil Service (Medical): No    Lack of Transportation (Non-Medical): No  Physical Activity: Sufficiently Active (04/18/2020)   Exercise Vital Sign    Days of Exercise per Week: 5 days    Minutes of Exercise per Session: 40 min  Stress: No Stress Concern Present (04/18/2020)   [employer] of Occupational Health - Occupational Stress Questionnaire    Feeling of Stress : Only a little  Recent Concern: Stress - Stress Concern Present (02/20/2020)   [employer] of Occupational Health - Occupational Stress Questionnaire    Feeling of Stress : Rather much  Social Connections: Socially Integrated (04/18/2020)   Social Connection and Isolation Panel [NHANES]    Frequency of Communication with Friends and Family: More than three times a week    Frequency of Social Gatherings with Friends and Family: More than three times a week    Attends Religious Services: More than 4 times per year    Active Member of [employer] or Organizations: Yes    Attends Engineer, structural: More than 4 times per year    Marital Status: Married  Catering manager Violence: Not At Risk (04/18/2020)   Humiliation, Afraid, Rape, and Kick questionnaire    Fear of Current or Ex-Partner: No    Emotionally Abused: No    Physically Abused: No    Sexually Abused: No   Social History   Tobacco Use  Smoking Status Never  Smokeless Tobacco Never   Social History   Substance and Sexual Activity  Alcohol Use No    Family History:  Family History  Problem Relation Age of Onset   Hypertension Mother    Hyperlipidemia Mother    Hypothyroidism Mother    CAD Father    Heart disease Father    Diabetes Maternal Grandfather     COPD Paternal Grandmother    Cancer Paternal Grandfather        colon    Past medical history, surgical history, medications, allergies, family history and social history reviewed with patient today and changes made to appropriate areas of the chart.   Review of Systems  Constitutional: Negative.   HENT: Negative.    Eyes: Negative.   Respiratory: Negative.    Cardiovascular: Negative.   Gastrointestinal: Negative.   Genitourinary: Negative.        VERY heavy periods  Musculoskeletal: Negative.   Skin: Negative.  Neurological: Negative.   Endo/Heme/Allergies: Negative.   Psychiatric/Behavioral: Negative.     All other ROS negative except what is listed above and in the HPI.      Objective:    BP 126/75 (BP Location: Left Arm, Patient Position: Sitting)   Pulse 73   Temp 98.3 F (36.8 C) (Oral)   Ht 5\' 3"  (1.6 m)   Wt 189 lb 3.2 oz (85.8 kg)   LMP 09/22/2023 (Exact Date)   SpO2 100%   BMI 33.52 kg/m   Wt Readings from Last 3 Encounters:  10/10/23 189 lb 3.2 oz (85.8 kg)  09/16/23 195 lb 9.6 oz (88.7 kg)  12/30/22 185 lb 6.4 oz (84.1 kg)    Physical Exam Vitals and nursing note reviewed.  Constitutional:      General: She is not in acute distress.    Appearance: Normal appearance. She is not ill-appearing, toxic-appearing or diaphoretic.  HENT:     Head: Normocephalic and atraumatic.     Right Ear: Tympanic membrane, ear canal and external ear normal. There is no impacted cerumen.     Left Ear: Tympanic membrane, ear canal and external ear normal. There is no impacted cerumen.     Nose: Nose normal. No congestion or rhinorrhea.     Mouth/Throat:     Mouth: Mucous membranes are moist.     Pharynx: Oropharynx is clear. No oropharyngeal exudate or posterior oropharyngeal erythema.  Eyes:     General: No scleral icterus.       Right eye: No discharge.        Left eye: No discharge.     Extraocular Movements: Extraocular movements intact.      Conjunctiva/sclera: Conjunctivae normal.     Pupils: Pupils are equal, round, and reactive to light.  Neck:     Vascular: No carotid bruit.  Cardiovascular:     Rate and Rhythm: Normal rate and regular rhythm.     Pulses: Normal pulses.     Heart sounds: No murmur heard.    No friction rub. No gallop.  Pulmonary:     Effort: Pulmonary effort is normal. No respiratory distress.     Breath sounds: Normal breath sounds. No stridor. No wheezing, rhonchi or rales.  Chest:     Chest wall: No tenderness.  Abdominal:     General: Abdomen is flat. Bowel sounds are normal. There is no distension.     Palpations: Abdomen is soft. There is no mass.     Tenderness: There is no abdominal tenderness. There is no right CVA tenderness, left CVA tenderness, guarding or rebound.     Hernia: No hernia is present.  Genitourinary:    Comments: Breast and pelvic exams deferred with shared decision making Musculoskeletal:        General: No swelling, tenderness, deformity or signs of injury.     Cervical back: Normal range of motion and neck supple. No rigidity. No muscular tenderness.     Right lower leg: No edema.     Left lower leg: No edema.  Lymphadenopathy:     Cervical: No cervical adenopathy.  Skin:    General: Skin is warm and dry.     Capillary Refill: Capillary refill takes less than 2 seconds.     Coloration: Skin is not jaundiced or pale.     Findings: No bruising, erythema, lesion or rash.  Neurological:     General: No focal deficit present.     Mental Status: She is alert and  oriented to person, place, and time. Mental status is at baseline.     Cranial Nerves: No cranial nerve deficit.     Sensory: No sensory deficit.     Motor: No weakness.     Coordination: Coordination normal.     Gait: Gait normal.     Deep Tendon Reflexes: Reflexes normal.  Psychiatric:        Mood and Affect: Mood normal.        Behavior: Behavior normal.        Thought Content: Thought content normal.         Judgment: Judgment normal.     Results for orders placed or performed in visit on 12/30/22  WET PREP FOR TRICH, YEAST, CLUE   Collection Time: 12/30/22  3:10 PM   Specimen: Urine   Urine  Result Value Ref Range   Trichomonas Exam Negative Negative   Yeast Exam Negative Negative   Clue Cell Exam Positive (A) Negative  Microscopic Examination   Collection Time: 12/30/22  3:10 PM   Urine  Result Value Ref Range   WBC, UA 6-10 (A) 0 - 5 /hpf   RBC, Urine 0-2 0 - 2 /hpf   Epithelial Cells (non renal) 0-10 0 - 10 /hpf   Bacteria, UA None seen None seen/Few  Urinalysis, Routine w reflex microscopic   Collection Time: 12/30/22  3:10 PM  Result Value Ref Range   Specific Gravity, UA 1.020 1.005 - 1.030   pH, UA 7.0 5.0 - 7.5   Color, UA Yellow Yellow   Appearance Ur Clear Clear   Leukocytes,UA 1+ (A) Negative   Protein,UA Negative Negative/Trace   Glucose, UA Negative Negative   Ketones, UA Negative Negative   RBC, UA Negative Negative   Bilirubin, UA Negative Negative   Urobilinogen, Ur 0.2 0.2 - 1.0 mg/dL   Nitrite, UA Negative Negative   Microscopic Examination See below:   Microalbumin, Urine Waived   Collection Time: 12/30/22  3:33 PM  Result Value Ref Range   Microalb, Ur Waived 30 (H) 0 - 19 mg/L   Creatinine, Urine Waived 100 10 - 300 mg/dL   Microalb/Creat Ratio 30-300 (H) <30 mg/g  Urine Culture   Collection Time: 12/30/22  3:38 PM   Specimen: Urine   UR  Result Value Ref Range   Urine Culture, Routine Final report    Organism ID, Bacteria No growth       Assessment & Plan:   Problem List Items Addressed This Visit       Cardiovascular and Mediastinum   Essential hypertension   Rechecking labs today. Await results. Treat as needed.         Other   Hyperlipidemia, mixed   Rechecking labs today. Await results. Treat as needed.       History of diabetes mellitus   Rechecking labs today. Await results. Treat as needed.       Other Visit  Diagnoses       Routine general medical examination at a health care facility    -  Primary   Vaccines declined. Screening labs checked today. Pap up to date. Continue diet and exercise. Continue to monitor. Mammogram ordered.   Relevant Orders   Bayer DCA Hb A1c Waived   CBC with Differential/Platelet   Comprehensive metabolic panel   Lipid Panel w/o Chol/HDL Ratio   TSH   Microalbumin, Urine Waived   VITAMIN D 25 Hydroxy (Vit-D Deficiency, Fractures)   B12   Ferritin  Menorrhagia with regular cycle       Discussed lo-loestrin vs ablation. Will review information and let us know what she'd like to do.     Vaginal discharge       Will check wet prep. Await results. Treat as needed.   Relevant Orders   WET PREP FOR TRICH, YEAST, CLUE     Encounter for screening mammogram for malignant neoplasm of breast       Relevant Orders   MM 3D SCREENING MAMMOGRAM BILATERAL BREAST        Follow up plan: Return in about 3 months (around 01/07/2024).   LABORATORY TESTING:  - Pap smear: pap done  IMMUNIZATIONS:   - Tdap: Tetanus vaccination status reviewed: last tetanus booster within 10 years. - Influenza: Refused - Pneumovax: Refused - Prevnar: Refused - COVID: Refused - HPV: Refused  SCREENING: -Mammogram: Ordered today  - Colonoscopy: Not applicable   PATIENT COUNSELING:   Advised to take 1 mg of folate supplement per day if capable of pregnancy.   Sexuality: Discussed sexually transmitted diseases, partner selection, use of condoms, avoidance of unintended pregnancy  and contraceptive alternatives.   Advised to avoid cigarette smoking.  I discussed with the patient that most people either abstain from alcohol or drink within safe limits (<=14/week and <=4 drinks/occasion for males, <=7/weeks and <= 3 drinks/occasion for females) and that the risk for alcohol disorders and other health effects rises proportionally with the number of drinks per week and how often a drinker  exceeds daily limits.  Discussed cessation/primary prevention of drug use and availability of treatment for abuse.   Diet: Encouraged to adjust caloric intake to maintain  or achieve ideal body weight, to reduce intake of dietary saturated fat and total fat, to limit sodium intake by avoiding high sodium foods and not adding table salt, and to maintain adequate dietary potassium and calcium preferably from fresh fruits, vegetables, and low-fat dairy products.    stressed the importance of regular exercise  Injury prevention: Discussed safety belts, safety helmets, smoke detector, smoking near bedding or upholstery.   Dental health: Discussed importance of regular tooth brushing, flossing, and dental visits.    NEXT PREVENTATIVE PHYSICAL DUE IN 1 YEAR. Return in about 3 months (around 01/07/2024).

## 2023-10-10 NOTE — Assessment & Plan Note (Signed)
 Rechecking labs today. Await results. Treat as needed.

## 2023-10-11 LAB — CBC WITH DIFFERENTIAL/PLATELET
Basophils Absolute: 0 10*3/uL (ref 0.0–0.2)
Basos: 1 %
EOS (ABSOLUTE): 0 10*3/uL (ref 0.0–0.4)
Eos: 0 %
Hematocrit: 34.1 % (ref 34.0–46.6)
Hemoglobin: 10.1 g/dL — ABNORMAL LOW (ref 11.1–15.9)
Immature Grans (Abs): 0 10*3/uL (ref 0.0–0.1)
Immature Granulocytes: 0 %
Lymphocytes Absolute: 1.8 10*3/uL (ref 0.7–3.1)
Lymphs: 31 %
MCH: 21.2 pg — ABNORMAL LOW (ref 26.6–33.0)
MCHC: 29.6 g/dL — ABNORMAL LOW (ref 31.5–35.7)
MCV: 72 fL — ABNORMAL LOW (ref 79–97)
Monocytes Absolute: 0.4 10*3/uL (ref 0.1–0.9)
Monocytes: 6 %
Neutrophils Absolute: 3.6 10*3/uL (ref 1.4–7.0)
Neutrophils: 62 %
Platelets: 324 10*3/uL (ref 150–450)
RBC: 4.76 x10E6/uL (ref 3.77–5.28)
RDW: 16.1 % — ABNORMAL HIGH (ref 11.7–15.4)
WBC: 5.9 10*3/uL (ref 3.4–10.8)

## 2023-10-11 LAB — COMPREHENSIVE METABOLIC PANEL
ALT: 37 [IU]/L — ABNORMAL HIGH (ref 0–32)
AST: 30 [IU]/L (ref 0–40)
Albumin: 4.3 g/dL (ref 3.9–4.9)
Alkaline Phosphatase: 84 [IU]/L (ref 44–121)
BUN/Creatinine Ratio: 23 (ref 9–23)
BUN: 15 mg/dL (ref 6–24)
Bilirubin Total: 0.4 mg/dL (ref 0.0–1.2)
CO2: 21 mmol/L (ref 20–29)
Calcium: 9.1 mg/dL (ref 8.7–10.2)
Chloride: 104 mmol/L (ref 96–106)
Creatinine, Ser: 0.64 mg/dL (ref 0.57–1.00)
Globulin, Total: 2.4 g/dL (ref 1.5–4.5)
Glucose: 78 mg/dL (ref 70–99)
Potassium: 3.4 mmol/L — ABNORMAL LOW (ref 3.5–5.2)
Sodium: 137 mmol/L (ref 134–144)
Total Protein: 6.7 g/dL (ref 6.0–8.5)
eGFR: 112 mL/min/{1.73_m2} (ref >59)

## 2023-10-11 LAB — FERRITIN: Ferritin: 9 ng/mL — ABNORMAL LOW (ref 15–150)

## 2023-10-11 LAB — VITAMIN B12: Vitamin B-12: >2000 pg/mL — ABNORMAL HIGH (ref 232–1245)

## 2023-10-11 LAB — LIPID PANEL W/O CHOL/HDL RATIO
Cholesterol, Total: 130 mg/dL (ref 100–199)
HDL: 52 mg/dL (ref >39)
LDL Chol Calc (NIH): 68 mg/dL (ref 0–99)
Triglycerides: 42 mg/dL (ref 0–149)
VLDL Cholesterol Cal: 10 mg/dL (ref 5–40)

## 2023-10-11 LAB — VITAMIN D 25 HYDROXY (VIT D DEFICIENCY, FRACTURES): Vit D, 25-Hydroxy: 63.2 ng/mL (ref 30.0–100.0)

## 2023-10-11 LAB — TSH: TSH: 1.02 u[IU]/mL (ref 0.450–4.500)

## 2023-10-12 ENCOUNTER — Encounter: Payer: Self-pay | Admitting: Family Medicine

## 2023-10-19 ENCOUNTER — Ambulatory Visit: Payer: Self-pay | Admitting: Family Medicine

## 2023-10-19 NOTE — Telephone Encounter (Signed)
 he also states that her period came early and she is having heavy bleeding and she is anemic. The patient has tingling in her hands and cold hands and feet. She is having fatigue, weakness and dizziness.     Chief Complaint: Continues to have heavy vaginal periods. States "Dr. Valentino Saxon is leaving her practice in April. Who else does she want to refer me to for the ablation?" Also asking if she needs to have iron infusions prior to her surgery June 2. Symptoms: Above Frequency: A long time Pertinent Negatives: Patient denies  Disposition: [] ED /[] Urgent Care (no appt availability in office) / [] Appointment(In office/virtual)/ []  [facility]/ [] Home Care/ [] Refused Recommended Disposition /[] [facility]/ [x]  Follow-up with PCP Additional Notes: Please advise pt.    Reason for Disposition  Periods with > 6 soaked pads or tampons per day  Answer Assessment - Initial Assessment Questions 1. AMOUNT: "Describe the bleeding that you are having."    - SPOTTING: spotting, or pinkish / brownish mucous discharge; does not fill panty liner or pad    - MILD:  less than 1 pad / hour; less than patient's usual menstrual bleeding   - MODERATE: 1-2 pads / hour; 1 menstrual cup every 6 hours; small-medium blood clots (e.g., pea, grape, small coin)   - SEVERE: soaking 2 or more pads/hour for 2 or more hours; 1 menstrual cup every 2 hours; bleeding not contained by pads or continuous red blood from vagina; large blood clots (e.g., golf ball, large coin)      Moderate 2. ONSET: "When did the bleeding begin?" "Is it continuing now?"     On day 6 3. MENSTRUAL PERIOD: "When was the last normal menstrual period?" "How is this different than your period?"     Unsure 4. REGULARITY: "How regular are your periods?"     Regular 5. ABDOMEN PAIN: "Do you have any pain?" "How bad is the pain?"  (e.g., Scale 1-10; mild, moderate, or severe)   - MILD (1-3): doesn't interfere with normal activities,  abdomen soft and not tender to touch    - MODERATE (4-7): interferes with normal activities or awakens from sleep, abdomen tender to touch    - SEVERE (8-10): excruciating pain, doubled over, unable to do any normal activities      Mild 6. PREGNANCY: "Is there any chance you are pregnant?" "When was your last menstrual period?"     No 7. BREASTFEEDING: "Are you breastfeeding?"     No 8. HORMONE MEDICINES: "Are you taking any hormone medicines, prescription or over-the-counter?" (e.g., birth control pills, estrogen)     No 9. BLOOD THINNER MEDICINES: "Do you take any blood thinners?" (e.g., Coumadin / warfarin, Pradaxa / dabigatran, aspirin)     No 10. CAUSE: "What do you think is causing the bleeding?" (e.g., recent gyn surgery, recent gyn procedure; known bleeding disorder, cervical cancer, polycystic ovarian disease, fibroids)         Unsure 11. HEMODYNAMIC STATUS: "Are you weak or feeling lightheaded?" If Yes, ask: "Can you stand and walk normally?"        Yes 12. OTHER SYMPTOMS: "What other symptoms are you having with the bleeding?" (e.g., passed tissue, vaginal discharge, fever, menstrual-type cramps)       Dizzy, hands cold, fatigue  Protocols used: Vaginal Bleeding - Abnormal-A-AH

## 2023-10-19 NOTE — Telephone Encounter (Signed)
 Routing to provider to advise.

## 2023-10-25 ENCOUNTER — Other Ambulatory Visit: Payer: Self-pay | Admitting: Family Medicine

## 2023-10-25 DIAGNOSIS — D500 Iron deficiency anemia secondary to blood loss (chronic): Secondary | ICD-10-CM

## 2023-10-25 DIAGNOSIS — N920 Excessive and frequent menstruation with regular cycle: Secondary | ICD-10-CM

## 2023-10-25 MED ORDER — IRON (FERROUS SULFATE) 325 (65 FE) MG PO TABS: 325.0000 mg | ORAL_TABLET | Freq: Two times a day (BID) | ORAL | 3 refills | Status: DC

## 2023-10-30 IMAGING — CT CT HEART MORP W/ CTA COR W/ SCORE W/ CA W/CM &/OR W/O CM
2 of 10 series · 8 of 20 positions shown, 9 images · non-contrast
Comparison: None.

Addendum:
CLINICAL DATA: Chest pain

EXAM:
Cardiac/Coronary  CTA
TECHNIQUE: The patient was scanned on a Siemens Somatom go.Top scanner.

[Series 17: multiphase % cta coronary 0.80 · axial · 0.37mm/px · z∈[-1089,-1006]mm · 4 of 2748 slices shown, 5 images]
[im 550/2748  vessel]
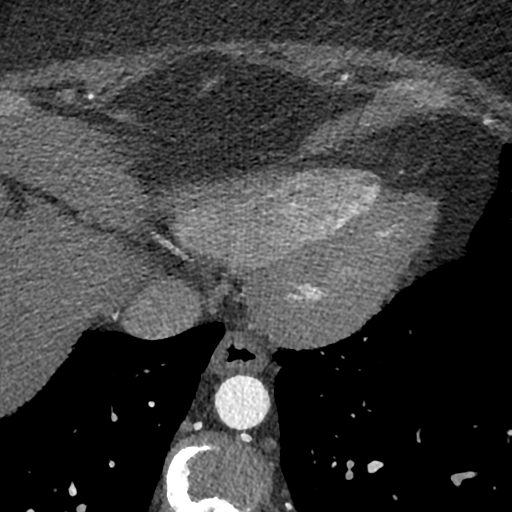
[im 550/2748  lung]
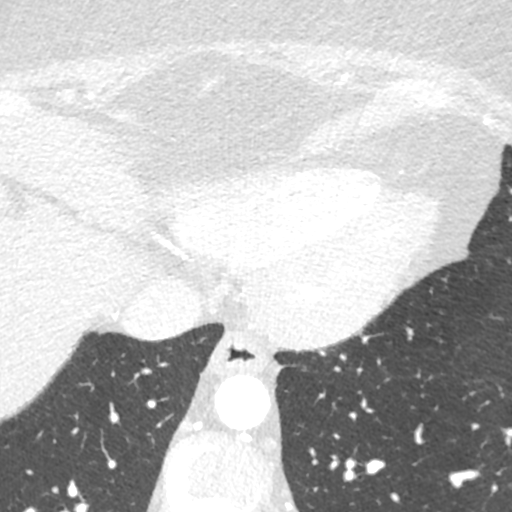
[im 1099/2748  vessel]
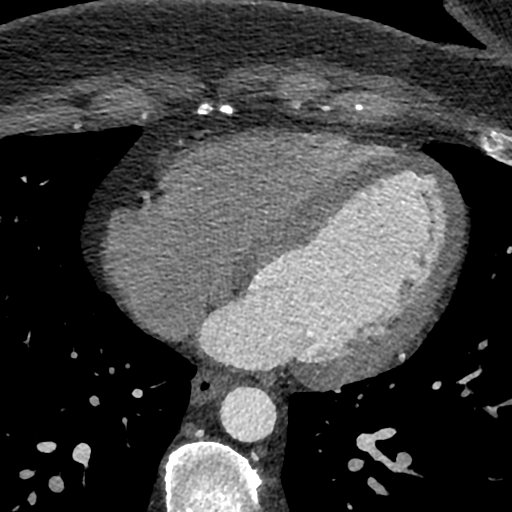
[im 1649/2748  vessel]
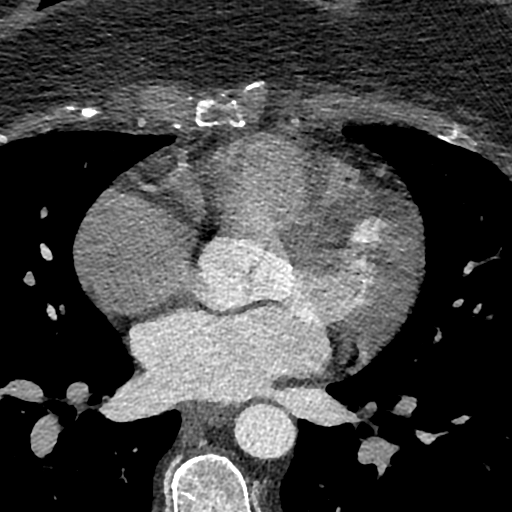
[im 2198/2748  vessel]
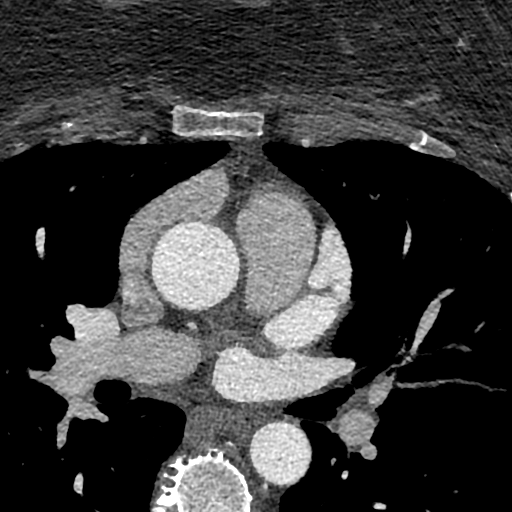

[Series 35: ms multiphase cta coronary 0.60 · axial · 0.37mm/px · z∈[-1089,-1006]mm · 4 of 2752 slices shown]
[im 551/2752  vessel]
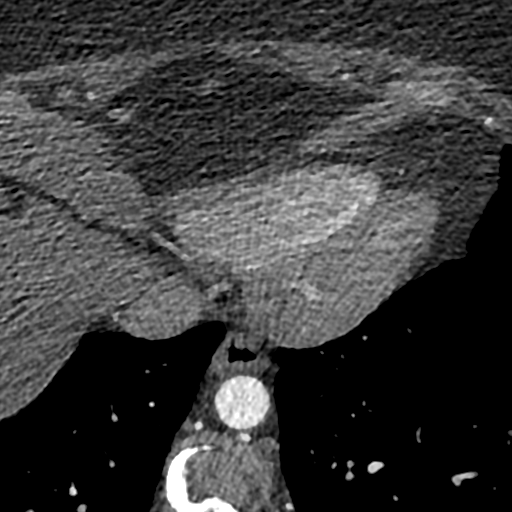
[im 1101/2752  vessel]
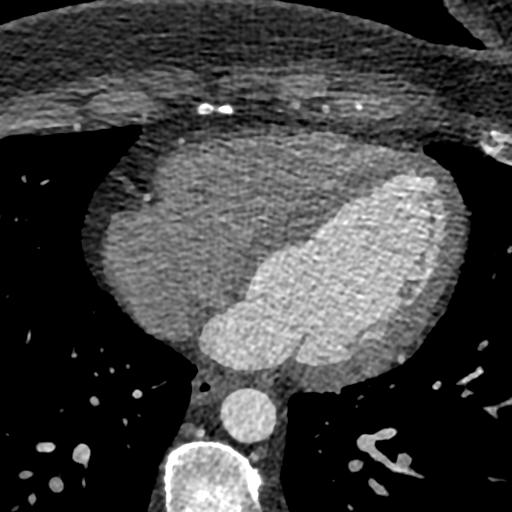
[im 1651/2752  vessel]
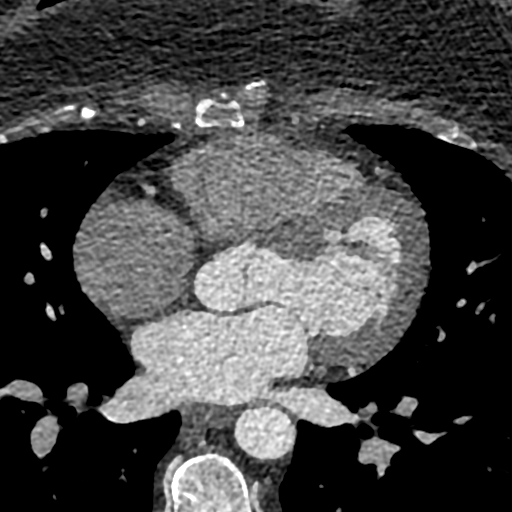
[im 2201/2752  vessel]
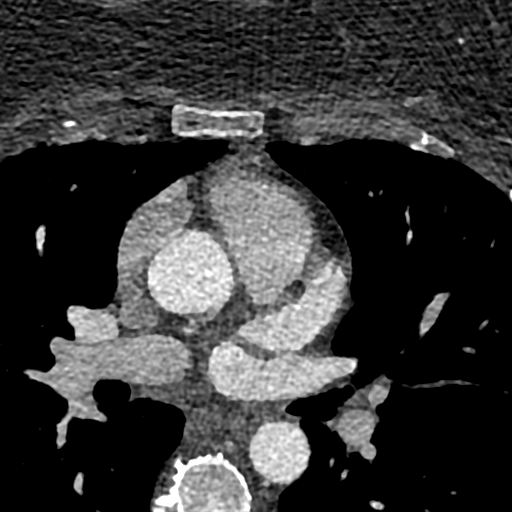

[8 of 20 positions shown; findings below may reference images not displayed]

:
A retrospective scan was triggered in the descending thoracic aorta.
Axial non-contrast 3 mm slices were carried out through the heart.
The data set was analyzed on a dedicated work station and scored
using the Agatson method. Gantry rotation speed was 330 msecs and
collimation was .6 mm. 100mg of metoprolol, ivabradine 15mg and
mg of sl NTG was given. The 3D data set was reconstructed in 5%
intervals of the 60-95 % of the R-R cycle. Diastolic phases were
analyzed on a dedicated work station using MPR, MIP and VRT modes.
The patient received 100 cc of contrast.
FINDINGS: Aorta:  Normal size.  No calcifications.  No dissection.

Aortic Valve:  Trileaflet.  No calcifications.

Coronary Arteries:  Normal coronary origin.  Right dominance.

RCA is a dominant artery that gives rise to PDA and PLA. There is no
plaque.

Left main is a large artery that gives rise to LAD and LCX arteries.
There is no LM disease

LAD has no plaque.

LCX is a non-dominant artery that gives rise to one large OM1
branch. There is no plaque.

Other findings:

Normal pulmonary vein drainage into the left atrium.

Normal left atrial appendage without a thrombus.

Normal size of the pulmonary artery.
IMPRESSION: 1. Normal coronary calcium score of 0. Patient is low risk for
coronary events.

2. Normal coronary origin with right dominance.

3. No evidence of CAD.

4. CAD-RADS 0. Consider non-atherosclerotic causes of chest pain.

5. Image quality degraded by obesity related artifacts

EXAM:
OVER-READ INTERPRETATION  CT CHEST

The following report is an over-read performed by radiologist Dr.
over-read does not include interpretation of cardiac or coronary
anatomy or pathology. The cardiac interpretation by the cardiologist
is attached.
FINDINGS: Cardiovascular: There are no significant vascular findings.

Mediastinum/Nodes: There are no visualized enlarged mediastinal,
hilar or axillary lymph nodes.

Lungs/Pleura: No pleural effusion or pneumothorax. No suspicious
nodules identified within the visualized lungs.

Upper abdomen:  The visualized upper abdomen appears unremarkable.

Musculoskeletal/Chest wall: Mild thoracic spondylosis.
IMPRESSION: No significant extracardiac findings within the visualized chest.

*** End of Addendum ***
:
A retrospective scan was triggered in the descending thoracic aorta.
Axial non-contrast 3 mm slices were carried out through the heart.
The data set was analyzed on a dedicated work station and scored
using the Agatson method. Gantry rotation speed was 330 msecs and
collimation was .6 mm. 100mg of metoprolol, ivabradine 15mg and
mg of sl NTG was given. The 3D data set was reconstructed in 5%
intervals of the 60-95 % of the R-R cycle. Diastolic phases were
analyzed on a dedicated work station using MPR, MIP and VRT modes.
The patient received 100 cc of contrast.
FINDINGS: Aorta:  Normal size.  No calcifications.  No dissection.

Aortic Valve:  Trileaflet.  No calcifications.

Coronary Arteries:  Normal coronary origin.  Right dominance.

RCA is a dominant artery that gives rise to PDA and PLA. There is no
plaque.

Left main is a large artery that gives rise to LAD and LCX arteries.
There is no LM disease

LAD has no plaque.

LCX is a non-dominant artery that gives rise to one large OM1
branch. There is no plaque.

Other findings:

Normal pulmonary vein drainage into the left atrium.

Normal left atrial appendage without a thrombus.

Normal size of the pulmonary artery.
IMPRESSION: 1. Normal coronary calcium score of 0. Patient is low risk for
coronary events.

2. Normal coronary origin with right dominance.

3. No evidence of CAD.

4. CAD-RADS 0. Consider non-atherosclerotic causes of chest pain.

5. Image quality degraded by obesity related artifacts

## 2023-11-04 ENCOUNTER — Encounter: Payer: Self-pay | Admitting: Plastic Surgery

## 2023-11-04 ENCOUNTER — Ambulatory Visit: Payer: 59 | Admitting: Plastic Surgery

## 2023-11-04 VITALS — BP 140/86 | HR 80 | Ht 63.0 in | Wt 176.1 lb

## 2023-11-04 DIAGNOSIS — M793 Panniculitis, unspecified: Secondary | ICD-10-CM | POA: Diagnosis not present

## 2023-11-04 NOTE — Progress Notes (Signed)
   Subjective:    Patient ID: Bonnie Armstrong, female    DOB: 27-Jan-1981, 43 y.o.   MRN: [id number]  The patient is a 43 year old female here for further discussion about panniculectomy with add-on abdominoplasty.  She would like to add the upper abdomen and liposuction.  And we have provided a quote for her.      Review of Systems  Constitutional: Negative.   Eyes: Negative.   Respiratory: Negative.    Cardiovascular: Negative.   Gastrointestinal: Negative.   Endocrine: Negative.   Musculoskeletal:  Positive for back pain and neck pain.  Skin:  Positive for wound.       Objective:   Physical Exam Cardiovascular:     Rate and Rhythm: Normal rate.     Pulses: Normal pulses.  Pulmonary:     Effort: Pulmonary effort is normal.  Skin:    General: Skin is warm.     Capillary Refill: Capillary refill takes less than 2 seconds.  Neurological:     Mental Status: She is oriented to person, place, and time.  Psychiatric:        Mood and Affect: Mood normal.        Behavior: Behavior normal.        Thought Content: Thought content normal.       Assessment & Plan:     ICD-10-CM   1. Panniculitis  M79.3       Plan for panniculectomy with add-on abdominoplasty and liposuction.

## 2023-11-04 NOTE — Addendum Note (Signed)
 Addended by: Peggye Form on: 11/04/2023 11:52 AM   Modules accepted: Orders

## 2023-11-05 ENCOUNTER — Encounter: Payer: Self-pay | Admitting: Family Medicine

## 2023-11-05 DIAGNOSIS — D500 Iron deficiency anemia secondary to blood loss (chronic): Secondary | ICD-10-CM

## 2023-11-05 DIAGNOSIS — N920 Excessive and frequent menstruation with regular cycle: Secondary | ICD-10-CM

## 2023-11-08 ENCOUNTER — Encounter: Payer: Self-pay | Admitting: Obstetrics

## 2023-11-08 ENCOUNTER — Ambulatory Visit: Admitting: Obstetrics

## 2023-11-08 VITALS — BP 117/81 | HR 104 | Ht 63.0 in | Wt 178.0 lb

## 2023-11-08 DIAGNOSIS — N939 Abnormal uterine and vaginal bleeding, unspecified: Secondary | ICD-10-CM | POA: Insufficient documentation

## 2023-11-08 DIAGNOSIS — N920 Excessive and frequent menstruation with regular cycle: Secondary | ICD-10-CM | POA: Insufficient documentation

## 2023-11-08 DIAGNOSIS — N921 Excessive and frequent menstruation with irregular cycle: Secondary | ICD-10-CM | POA: Diagnosis not present

## 2023-11-08 MED ORDER — DROSPIRENONE-ETHINYL ESTRADIOL 3-0.02 MG PO TABS: 1.0000 | ORAL_TABLET | Freq: Every day | ORAL | 0 refills | Status: DC

## 2023-11-08 NOTE — Progress Notes (Signed)
 GYNECOLOGY PROGRESS NOTE  Subjective:  PCP: Dorcas Carrow, DO  Patient ID: Bonnie Armstrong, female    DOB: 1981-04-12, 43 y.o.   MRN: [id number]  HPI  Patient is a 43 y.o. [id number] female who presents as a referral from [facility], DO for heavy periods w/anemia, wanting to discuss endometrial ablation. Pt's periods became heavier over the past 2-3 yrs, are still regular Q28 days, with last cycle being 24days, they will last 6-7 days, and her flow is heavy, changing her pad Q1hr, sometimes more frequent. Recent CBC 10/10/23 with Hgb of 10.1, she is taking daily PO iron. She is not on any hormonal contraception and has not had any procedures or treatment for her bleeding. No recent pelvic US.   Pt has done a lot of research on her own, understands that ablation does not guarantee amenorrhea, but is meant to lighten menses. She called her insurance and was told she may need to "fail hormonal medication first prior to authorization for ablation." She prefers to avoid hormonal medical therapy, but is amenable to a trial of OCPs if that is required. Pt is scheduled for elective surgery on June 2nd, is hoping to complete ablation by that time.   GYN Hx: Last pap: 03/05/20, NILM, HPV neg; denies hx of abnormal Last mammogram: no prior STI hx: denies Sexually active: yes, 1 partner, female Contraception: partner vasectomy  OB History  Gravida Para Term Preterm AB Living  7 6 5 1 1 6   SAB IAB Ectopic Multiple Live Births  1   0 6    # Outcome Date GA Lbr Len/2nd Weight Sex Type Anes PTL Lv  7 Term 04/06/17 [redacted]w[redacted]d / 00:01 8 lb 1.1 oz (3.66 kg) F Vag-Spont None  LIV  6 Term 12/15/15 [redacted]w[redacted]d  7 lb 6 oz (3.345 kg) M Vag-Spont   LIV  5 Term 12/19/07 [redacted]w[redacted]d  8 lb 6 oz (3.799 kg) M Vag-Spont   LIV  4 SAB 2007          3 Term 02/20/05 [redacted]w[redacted]d  7 lb 15 oz (3.6 kg) M Vag-Spont   LIV  2 Preterm 04/05/02 [redacted]w[redacted]d  3 lb (1.361 kg) M Vag-Spont   LIV  1 Term 05/23/01 [redacted]w[redacted]d  6 lb 15 oz (3.147 kg) F CS-LTranv    LIV     Birth Comments: Baby stuck in birth cannal, had STAT c/s    Past Medical History:  Diagnosis Date   Gestational diabetes    IFG (impaired fasting glucose)    Morbid obesity (HCC) 02/28/2015   Prediabetes    Past Surgical History:  Procedure Laterality Date   BARIATRIC SURGERY  02/26/2022   CESAREAN SECTION  08/16/2000   WISDOM TOOTH EXTRACTION     Family History  Problem Relation Age of Onset   Hypertension Mother    Hyperlipidemia Mother    Hypothyroidism Mother    CAD Father    Heart disease Father    Diabetes Maternal Grandfather    COPD Paternal Grandmother    Cancer Paternal Grandfather        colon   Social History   Socioeconomic History   Marital status: Married    Spouse name: Not on file   Number of children: Not on file   Years of education: Not on file   Highest education level: Not on file  Occupational History   Not on file  Tobacco Use   Smoking status: Never   Smokeless tobacco: Never  Vaping Use   Vaping status: Never Used  Substance and Sexual Activity   Alcohol use: No   Drug use: No   Sexual activity: Yes    Birth control/protection: None  Other Topics Concern   Not on file  Social History Narrative   Not on file   Social Drivers of Health   Financial Resource Strain: Low Risk  (04/18/2020)   Overall Financial Resource Strain (CARDIA)    Difficulty of Paying Living Expenses: Not hard at all  Food Insecurity: No Food Insecurity (04/18/2020)   Hunger Vital Sign    Worried About Running Out of Food in the Last Year: Never true    Ran Out of Food in the Last Year: Never true  Transportation Needs: No Transportation Needs (04/18/2020)   PRAPARE - Administrator, Civil Service (Medical): No    Lack of Transportation (Non-Medical): No  Physical Activity: Sufficiently Active (04/18/2020)   Exercise Vital Sign    Days of Exercise per Week: 5 days    Minutes of Exercise per Session: 40 min  Stress: No Stress Concern Present  (04/18/2020)   [employer] of Occupational Health - Occupational Stress Questionnaire    Feeling of Stress : Only a little  Recent Concern: Stress - Stress Concern Present (02/20/2020)   [employer] of Occupational Health - Occupational Stress Questionnaire    Feeling of Stress : Rather much  Social Connections: Socially Integrated (04/18/2020)   Social Connection and Isolation Panel [NHANES]    Frequency of Communication with Friends and Family: More than three times a week    Frequency of Social Gatherings with Friends and Family: More than three times a week    Attends Religious Services: More than 4 times per year    Active Member of [employer] or Organizations: Yes    Attends Engineer, structural: More than 4 times per year    Marital Status: Married  Catering manager Violence: Not At Risk (04/18/2020)   Humiliation, Afraid, Rape, and Kick questionnaire    Fear of Current or Ex-Partner: No    Emotionally Abused: No    Physically Abused: No    Sexually Abused: No   Current Outpatient Medications on File Prior to Visit  Medication Sig Dispense Refill   CALCIUM CITRATE PO Take 1 tablet by mouth 3 (three) times daily.     Iron, Ferrous Sulfate, 325 (65 Fe) MG TABS Take 325 mg by mouth 2 (two) times daily. 60 tablet 3   Multiple Vitamin (MULTI-VITAMIN) tablet Take 1 tablet by mouth daily.     nystatin cream (MYCOSTATIN) APPLY TO AFFECTED AREA TWICE A DAY 30 g 6   phentermine 15 MG capsule Take 15 mg by mouth every morning.     topiramate (TOPAMAX) 25 MG tablet Take 25 mg by mouth 2 (two) times daily.     fluconazole (DIFLUCAN) 150 MG tablet Take 1 tablet (150 mg total) by mouth daily. Take 2nd pill when you finish flagyl 2 tablet 0   metroNIDAZOLE (METROGEL) 0.75 % vaginal gel Place 1 Applicatorful vaginally at bedtime. 70 g 0   No current facility-administered medications on file prior to visit.   No Known Allergies  Review of Systems Pertinent items are noted in HPI.    Objective:   Blood pressure 117/81, pulse (!) 104, height 5\' 3"  (1.6 m), weight 178 lb (80.7 kg), last menstrual period 10/15/2023, currently breastfeeding. Body mass index is 31.53 kg/m.  Physical Exam Vitals and nursing note  reviewed.  Constitutional:      Appearance: Normal appearance. She is obese.  HENT:     Head: Normocephalic and atraumatic.  Eyes:     Extraocular Movements: Extraocular movements intact.  Pulmonary:     Effort: Pulmonary effort is normal.  Musculoskeletal:        General: Normal range of motion.  Neurological:     General: No focal deficit present.     Mental Status: She is alert.  Psychiatric:        Mood and Affect: Mood normal.   Assessment/Plan:   1. Abnormal uterine bleeding (AUB)   2. Menorrhagia with regular cycle    Bonnie Armstrong is a 43 y.o. [id number] with 2-46yr hx of HMB. We discussed management options including NSAIDs, tranexamic acid (Lysteda), oral medications, Depo Provera, Levonorgestrel containing IUD, endometrial ablation (Novasure) or hysterectomy as definitive surgical management.  Discussed risks and benefits of each method. Final management decision will hinge on results of patient's work up and whether an underlying etiology for the patients bleeding symptoms can be discerned.  We will conduct a basic work up examining using the PALM-COIEN classification system.  In the meantime the patient opts to trial COCs while we await results of her ultrasound. If candidate for ablation, pt desires. Bleeding precautions reviewed.  -Complete pelvic US, ordered -Rx for COCs -- start on first day of next menstrual cycle; SE and dosing reviewed -If [place] w/o other pathology, will proceed with scheduling ablation. Pt will need to RTC 1-2 wks prior to procedure for pre-op visit and EMB.  -RTC for annual exam -- is due for mammogram and pap   Julieanne Manson, DO [facility]

## 2023-11-08 NOTE — Patient Instructions (Signed)
 Endometrial Ablation: What to Expect An endometrial ablation is a procedure that removes the thin layer of the lining of the uterus. This lining is called the endometrium. This procedure may be done to: Stop heavy periods that can cause low red blood cell count (anemia). Try to stop irregular bleeding from your vagina. Treat bleeding that's caused by small tumors (fibroids) in the uterus. After this procedure, you may not be able to have children. Tell a health care provider about: Any allergies you have. All medicines you're taking. These include vitamins, herbs, eye drops, creams, and over-the-counter medicines. Any problems you or family members have had with anesthesia. Any bleeding problems you have. Any surgeries you have had. Any medical conditions you have. Whether you're pregnant or may be pregnant. What are the risks? Your health care provider will talk with you about risks. These may include: Bleeding. Infection. Allergic reactions to medicines. Injury to the uterus, bowel, or nearby structures. An air bubble in the lung (air embolus). Problems with pregnancy. Scar tissue. What happens before the procedure? Medicines Ask about changing or stopping: Any medicines you take. Any vitamins, herbs, or supplements you take. Do not take aspirin or ibuprofen unless you're told to. Tests You will have tests of your uterus to make sure that there's no cancer. You may have an ultrasound of the uterus. Surgery safety For your safety, you may: Need to wash your skin with a soap that kills germs. Get antibiotics. Have hair removed at the procedure site. General instructions Do not smoke, vape, or use nicotine or tobacco. You may be given medicines to thin the lining of the uterus. If you'll be going home right after the procedure, plan to have a responsible adult: Drive you home from the hospital or clinic. You won't be allowed to drive. Stay with you for the time you're  told. What happens during the procedure?  You will lie on an exam table with your feet and legs supported. An IV will be inserted into one of your veins. You will be given a sedative to help you relax. A surgical tool with a light and camera (resectoscope) will be inserted into your vagina and moved into your uterus. This allows your surgeon to see inside your uterus. The lining of your uterus will be taken out. Your surgeon will use one of these methods: Radiofrequency. This uses an electric current to destroy the lining of the uterus. Cryotherapy. This uses something very cold to freeze the lining of the uterus. Heated fluid. This uses very hot salt and water solution (saline) solution to destroy the lining of the uterus. Microwave. This uses high-energy waves to heat up the lining of the uterus and destroy it. Thermal balloon. A balloon filled with heated fluid is inserted into the uterus. The heated fluid destroys the lining of the uterus. These steps may vary. Ask what you can expect. What happens after the procedure? You may be watched closely until you leave. This includes checking your pain level, blood pressure, heart rate, and breathing rate. If you were given a sedative, do not drive or use machines until you're told it's safe. A sedative can make you sleepy. This information is not intended to replace advice given to you by your health care provider. Make sure you discuss any questions you have with your health care provider. Document Revised: 01/18/2023 Document Reviewed: 01/18/2023 Elsevier Patient Education  2024 [employer].

## 2023-11-10 NOTE — Telephone Encounter (Signed)
 New referral placed.

## 2023-11-17 ENCOUNTER — Ambulatory Visit
Admission: RE | Admit: 2023-11-17 | Discharge: 2023-11-17 | Disposition: A | Discharge status: Home / Self Care | Source: Ambulatory Visit | Attending: Obstetrics | Admitting: Obstetrics

## 2023-11-17 ENCOUNTER — Encounter: Payer: Self-pay | Admitting: Obstetrics

## 2023-11-17 DIAGNOSIS — N939 Abnormal uterine and vaginal bleeding, unspecified: Secondary | ICD-10-CM | POA: Insufficient documentation

## 2023-11-17 DIAGNOSIS — N920 Excessive and frequent menstruation with regular cycle: Secondary | ICD-10-CM | POA: Insufficient documentation

## 2023-11-22 ENCOUNTER — Encounter: Payer: Self-pay | Admitting: Obstetrics

## 2023-11-30 ENCOUNTER — Other Ambulatory Visit (HOSPITAL_COMMUNITY)
Admission: RE | Admit: 2023-11-30 | Discharge: 2023-11-30 | Disposition: A | Discharge status: Home / Self Care | Source: Ambulatory Visit | Attending: Obstetrics | Admitting: Obstetrics

## 2023-11-30 ENCOUNTER — Encounter: Payer: Self-pay | Admitting: Obstetrics

## 2023-11-30 ENCOUNTER — Ambulatory Visit (INDEPENDENT_AMBULATORY_CARE_PROVIDER_SITE_OTHER): Admitting: Obstetrics

## 2023-11-30 VITALS — BP 131/76 | HR 105 | Ht 63.0 in | Wt 177.0 lb

## 2023-11-30 DIAGNOSIS — N939 Abnormal uterine and vaginal bleeding, unspecified: Secondary | ICD-10-CM

## 2023-11-30 DIAGNOSIS — N859 Noninflammatory disorder of uterus, unspecified: Secondary | ICD-10-CM | POA: Diagnosis not present

## 2023-11-30 DIAGNOSIS — N920 Excessive and frequent menstruation with regular cycle: Secondary | ICD-10-CM

## 2023-11-30 DIAGNOSIS — N858 Other specified noninflammatory disorders of uterus: Secondary | ICD-10-CM | POA: Diagnosis not present

## 2023-11-30 NOTE — Patient Instructions (Addendum)
 GYNECOLOGY PRE-OPERATIVE INSTRUCTIONS  You are scheduled for surgery on 12/19/23.  The name of your procedure is: hysteroscopy with Novasure endometrial ablation.   Please read through these instructions carefully regarding preparation for your surgery: Nothing to eat after midnight on the day prior to surgery.  Do not take any medications unless recommended by your provider on day prior to surgery.  Do not take NSAIDs (Motrin, Aleve) or aspirin 3 days prior to surgery.  You may take Tylenol products for minor aches and pains.  You will receive a prescription for pain medications post-operatively.  You will be contacted by phone approximately 1-2 weeks prior to surgery to schedule your pre-operative appointment.  Please call the office if you have any questions regarding your upcoming surgery.

## 2023-11-30 NOTE — Progress Notes (Signed)
 GYNECOLOGY PREOPERATIVE HISTORY AND PHYSICAL   Subjective:  Bonnie Armstrong is a 43 y.o. [id number] here for surgical management of heavy menstrual bleeding.   Indications for procedure include: failed medical therapy and desires ablation. No significant preoperative concerns. She is taking the COC daily and bleeding has not lightened.   Proposed surgery: Hysteroscopy w/Novasure endometrial ablation  Period Cycle (Days): 30 Period Duration (Days): 8 Period Pattern: Regular Menstrual Flow: Heavy Menstrual Control: Maxi pad, Thin pad Dysmenorrhea: (!) Moderate Dysmenorrhea Symptoms: Cramping   Pertinent Gynecological History: Period Cycle (Days): 30 Period Duration (Days): 8 Period Pattern: Regular Menstrual Flow: Heavy Menstrual Control: Maxi pad, Thin pad Dysmenorrhea: (!) Moderate Dysmenorrhea Symptoms: Cramping  Contraception: OCP (estrogen/progesterone) Last pap: normal,  HPV neg;  Date: 03/05/20   Past Medical History:  Diagnosis Date   Gestational diabetes    IFG (impaired fasting glucose)    Morbid obesity (HCC) 02/28/2015   Prediabetes    Past Surgical History:  Procedure Laterality Date   BARIATRIC SURGERY  02/26/2022   CESAREAN SECTION  08/16/2000   WISDOM TOOTH EXTRACTION     OB History  Gravida Para Term Preterm AB Living  7 6 5 1 1 6   SAB IAB Ectopic Multiple Live Births  1   0 6    # Outcome Date GA Lbr Len/2nd Weight Sex Type Anes PTL Lv  7 Term 04/06/17 [redacted]w[redacted]d / 00:01 8 lb 1.1 oz (3.66 kg) F Vag-Spont None  LIV  6 Term 12/15/15 [redacted]w[redacted]d  7 lb 6 oz (3.345 kg) M Vag-Spont   LIV  5 Term 12/19/07 [redacted]w[redacted]d  8 lb 6 oz (3.799 kg) M Vag-Spont   LIV  4 SAB 2007          3 Term 02/20/05 [redacted]w[redacted]d  7 lb 15 oz (3.6 kg) M Vag-Spont   LIV  2 Preterm 04/05/02 [redacted]w[redacted]d  3 lb (1.361 kg) M Vag-Spont   LIV  1 Term 05/23/01 [redacted]w[redacted]d  6 lb 15 oz (3.147 kg) F CS-LTranv   LIV     Birth Comments: Baby stuck in birth cannal, had STAT c/s   Patient denies any other pertinent gynecologic  issues.  Family History  Problem Relation Age of Onset   Hypertension Mother    Hyperlipidemia Mother    Hypothyroidism Mother    CAD Father    Heart disease Father    Diabetes Maternal Grandfather    COPD Paternal Grandmother    Cancer Paternal Grandfather        colon   Social History   Socioeconomic History   Marital status: Married    Spouse name: Not on file   Number of children: Not on file   Years of education: Not on file   Highest education level: Not on file  Occupational History   Not on file  Tobacco Use   Smoking status: Never   Smokeless tobacco: Never  Vaping Use   Vaping status: Never Used  Substance and Sexual Activity   Alcohol use: No   Drug use: No   Sexual activity: Yes    Birth control/protection: None  Other Topics Concern   Not on file  Social History Narrative   Not on file   Social Drivers of Health   Financial Resource Strain: Low Risk  (04/18/2020)   Overall Financial Resource Strain (CARDIA)    Difficulty of Paying Living Expenses: Not hard at all  Food Insecurity: No Food Insecurity (04/18/2020)   Hunger Vital Sign  Worried About Programme researcher, broadcasting/film/video in the Last Year: Never true    Ran Out of Food in the Last Year: Never true  Transportation Needs: No Transportation Needs (04/18/2020)   PRAPARE - Administrator, Civil Service (Medical): No    Lack of Transportation (Non-Medical): No  Physical Activity: Sufficiently Active (04/18/2020)   Exercise Vital Sign    Days of Exercise per Week: 5 days    Minutes of Exercise per Session: 40 min  Stress: No Stress Concern Present (04/18/2020)   [employer] of Occupational Health - Occupational Stress Questionnaire    Feeling of Stress : Only a little  Recent Concern: Stress - Stress Concern Present (02/20/2020)   [employer] of Occupational Health - Occupational Stress Questionnaire    Feeling of Stress : Rather much  Social Connections: Socially Integrated (04/18/2020)    Social Connection and Isolation Panel [NHANES]    Frequency of Communication with Friends and Family: More than three times a week    Frequency of Social Gatherings with Friends and Family: More than three times a week    Attends Religious Services: More than 4 times per year    Active Member of [employer] or Organizations: Yes    Attends Engineer, structural: More than 4 times per year    Marital Status: Married  Catering manager Violence: Not At Risk (04/18/2020)   Humiliation, Afraid, Rape, and Kick questionnaire    Fear of Current or Ex-Partner: No    Emotionally Abused: No    Physically Abused: No    Sexually Abused: No   Current Outpatient Medications on File Prior to Visit  Medication Sig Dispense Refill   CALCIUM CITRATE PO Take 1 tablet by mouth 3 (three) times daily.     drospirenone-ethinyl estradiol (YAZ) 3-0.02 MG tablet Take 1 tablet by mouth daily. 84 tablet 0   Iron, Ferrous Sulfate, 325 (65 Fe) MG TABS Take 325 mg by mouth 2 (two) times daily. 60 tablet 3   Multiple Vitamin (MULTI-VITAMIN) tablet Take 1 tablet by mouth daily.     nystatin cream (MYCOSTATIN) APPLY TO AFFECTED AREA TWICE A DAY 30 g 6   phentermine 15 MG capsule Take 15 mg by mouth every morning.     topiramate (TOPAMAX) 25 MG tablet Take 25 mg by mouth 2 (two) times daily.     fluconazole (DIFLUCAN) 150 MG tablet Take 1 tablet (150 mg total) by mouth daily. Take 2nd pill when you finish flagyl 2 tablet 0   metroNIDAZOLE (METROGEL) 0.75 % vaginal gel Place 1 Applicatorful vaginally at bedtime. 70 g 0   No current facility-administered medications on file prior to visit.   No Known Allergies  Review of Systems Constitutional: No recent fever/chills/sweats Respiratory: No recent cough/bronchitis Cardiovascular: No chest pain Gastrointestinal: No recent nausea/vomiting/diarrhea Genitourinary: No UTI symptoms Hematologic/lymphatic:No history of coagulopathy or recent blood thinner use    Objective:    Blood pressure 131/76, pulse (!) 105, height 5\' 3"  (1.6 m), weight 177 lb (80.3 kg), last menstrual period 11/12/2023, currently breastfeeding. CONSTITUTIONAL: Well-developed, well-nourished female in no acute distress.  HENT:  Normocephalic, atraumatic, External right and left ear normal. Oropharynx is clear and moist EYES: Conjunctivae and EOM are normal. Pupils are equal, round, and reactive to light. No scleral icterus.  NECK: Normal range of motion, supple, no masses SKIN: Skin is warm and dry. No rash noted. Not diaphoretic. No erythema. No pallor. NEUROLOGIC: Alert and oriented to person, place, and  time. Normal reflexes, muscle tone coordination. No cranial nerve deficit noted. PSYCHIATRIC: Normal mood and affect. Normal behavior. Normal judgment and thought content. CARDIOVASCULAR: Normal heart rate noted, regular rhythm RESPIRATORY: Effort and breath sounds normal, no problems with respiration noted ABDOMEN: Soft, nontender, nondistended. PELVIC: Normal vaginal mucosa, cervix not friable, no CMT, uterus non-tender, mobile MUSCULOSKELETAL: Normal range of motion. No edema and no tenderness. 2+ distal pulses.  Labs: No results found for this or any previous visit (from the past 2 weeks).  Imaging Studies: US PELVIC COMPLETE WITH TRANSVAGINAL Result Date: 11/17/2023 CLINICAL DATA:  Menorrhagia for 3 years. EXAM: TRANSABDOMINAL AND TRANSVAGINAL ULTRASOUND OF PELVIS TECHNIQUE: Both transabdominal and transvaginal ultrasound examinations of the pelvis were performed. Transabdominal technique was performed for global imaging of the pelvis including uterus, ovaries, adnexal regions, and pelvic cul-de-sac. It was necessary to proceed with endovaginal exam following the transabdominal exam to visualize the endometrium and bilateral ovaries. COMPARISON:  None Available. FINDINGS: Uterus Measurements: 8.4 x 5.2 x 5.8 cm = volume: 131.7 mL. No fibroids or other mass visualized. Tiny focal  calcification in the myometrium. Endometrium Thickness: 9 mm.  No focal abnormality visualized. Right ovary Measurements: 3.6 x 2.2 x 2.2 cm = volume: 8.9 mL. Normal appearance/no adnexal mass. Left ovary Measurements: 2.9 x 1.8 x 2.1 cm = volume: 5.8 mL. Normal appearance/no adnexal mass. Other findings No abnormal free fluid.  Nabothian cysts are noted. IMPRESSION: No acute abnormality identified. Endometrium measures 9 mm. If bleeding remains unresponsive to hormonal or medical therapy, sonohysterogram should be considered for focal lesion work-up. (Ref: Radiological Reasoning: Algorithmic Workup of Abnormal Vaginal Bleeding with Endovaginal Sonography and Sonohysterography. AJR 2008; [id number]:W96-04) Electronically Signed   By: Sherian Rein M.D.   On: 11/17/2023 12:20   Endometrial Biopsy Procedure Note  The patient was positioned on the exam table in the dorsal lithotomy position. Bimanual exam confirmed uterine position and size. A Graves speculum was placed into the vagina. A single toothed tenaculum was placed onto the anterior lip of the cervix. The pipette was placed into the endocervical canal and advanced to the uterine fundus. Using a piston like technique, with vacuum created by withdrawing the stylus, the endometrial specimen was obtained and transferred to the biopsy container. Minimal bleeding encountered. The procedure was well tolerated.   Uterine Position: mid   Uterine Length: 9cm   Uterine Specimen: Average  Assessment:     Bonnie Armstrong is a 43 y.o. [id number] with [facility] desiring Novasure ablation as management. She has upcoming elective cosmetic surgery on 01/16/24 and surgeon does not want her to undergo general anesthesia within the month prior. Wanting to have ablation as soon as possible.    Plan:    Counseling: Procedure, risks, reasons, benefits and complications (including injury to bowel, bladder, major blood vessel, ureter, bleeding, possibility of transfusion, infection)  reviewed in detail. Likelihood of success in alleviating the patient's condition was discussed. Routine postoperative instructions will be reviewed with the patient and her family in detail after surgery.  The patient concurred with the proposed plan, giving informed written consent for the surgery.   Patient requesting Dec 19, 2023 Preop testing ordered Instructions reviewed, including NPO after midnight We will request a pre-op consult with anesthesia for pt to discuss her concerns Post-op visit 2wks from procedure date   Julieanne Manson, DO  [facility]

## 2023-11-30 NOTE — H&P (View-Only) (Signed)
 GYNECOLOGY PREOPERATIVE HISTORY AND PHYSICAL   Subjective:  Bonnie Armstrong is a 43 y.o. [id number] here for surgical management of heavy menstrual bleeding.   Indications for procedure include: failed medical therapy and desires ablation. No significant preoperative concerns. She is taking the COC daily and bleeding has not lightened.   Proposed surgery: Hysteroscopy w/Novasure endometrial ablation  Period Cycle (Days): 30 Period Duration (Days): 8 Period Pattern: Regular Menstrual Flow: Heavy Menstrual Control: Maxi pad, Thin pad Dysmenorrhea: (!) Moderate Dysmenorrhea Symptoms: Cramping   Pertinent Gynecological History: Period Cycle (Days): 30 Period Duration (Days): 8 Period Pattern: Regular Menstrual Flow: Heavy Menstrual Control: Maxi pad, Thin pad Dysmenorrhea: (!) Moderate Dysmenorrhea Symptoms: Cramping  Contraception: OCP (estrogen/progesterone) Last pap: normal,  HPV neg;  Date: 03/05/20   Past Medical History:  Diagnosis Date   Gestational diabetes    IFG (impaired fasting glucose)    Morbid obesity (HCC) 02/28/2015   Prediabetes    Past Surgical History:  Procedure Laterality Date   BARIATRIC SURGERY  02/26/2022   CESAREAN SECTION  08/16/2000   WISDOM TOOTH EXTRACTION     OB History  Gravida Para Term Preterm AB Living  7 6 5 1 1 6   SAB IAB Ectopic Multiple Live Births  1   0 6    # Outcome Date GA Lbr Len/2nd Weight Sex Type Anes PTL Lv  7 Term 04/06/17 [redacted]w[redacted]d / 00:01 8 lb 1.1 oz (3.66 kg) F Vag-Spont None  LIV  6 Term 12/15/15 [redacted]w[redacted]d  7 lb 6 oz (3.345 kg) M Vag-Spont   LIV  5 Term 12/19/07 [redacted]w[redacted]d  8 lb 6 oz (3.799 kg) M Vag-Spont   LIV  4 SAB 2007          3 Term 02/20/05 [redacted]w[redacted]d  7 lb 15 oz (3.6 kg) M Vag-Spont   LIV  2 Preterm 04/05/02 [redacted]w[redacted]d  3 lb (1.361 kg) M Vag-Spont   LIV  1 Term 05/23/01 [redacted]w[redacted]d  6 lb 15 oz (3.147 kg) F CS-LTranv   LIV     Birth Comments: Baby stuck in birth cannal, had STAT c/s   Patient denies any other pertinent gynecologic  issues.  Family History  Problem Relation Age of Onset   Hypertension Mother    Hyperlipidemia Mother    Hypothyroidism Mother    CAD Father    Heart disease Father    Diabetes Maternal Grandfather    COPD Paternal Grandmother    Cancer Paternal Grandfather        colon   Social History   Socioeconomic History   Marital status: Married    Spouse name: Not on file   Number of children: Not on file   Years of education: Not on file   Highest education level: Not on file  Occupational History   Not on file  Tobacco Use   Smoking status: Never   Smokeless tobacco: Never  Vaping Use   Vaping status: Never Used  Substance and Sexual Activity   Alcohol use: No   Drug use: No   Sexual activity: Yes    Birth control/protection: None  Other Topics Concern   Not on file  Social History Narrative   Not on file   Social Drivers of Health   Financial Resource Strain: Low Risk  (04/18/2020)   Overall Financial Resource Strain (CARDIA)    Difficulty of Paying Living Expenses: Not hard at all  Food Insecurity: No Food Insecurity (04/18/2020)   Hunger Vital Sign  Worried About Programme researcher, broadcasting/film/video in the Last Year: Never true    Ran Out of Food in the Last Year: Never true  Transportation Needs: No Transportation Needs (04/18/2020)   PRAPARE - Administrator, Civil Service (Medical): No    Lack of Transportation (Non-Medical): No  Physical Activity: Sufficiently Active (04/18/2020)   Exercise Vital Sign    Days of Exercise per Week: 5 days    Minutes of Exercise per Session: 40 min  Stress: No Stress Concern Present (04/18/2020)   [employer] of Occupational Health - Occupational Stress Questionnaire    Feeling of Stress : Only a little  Recent Concern: Stress - Stress Concern Present (02/20/2020)   [employer] of Occupational Health - Occupational Stress Questionnaire    Feeling of Stress : Rather much  Social Connections: Socially Integrated (04/18/2020)    Social Connection and Isolation Panel [NHANES]    Frequency of Communication with Friends and Family: More than three times a week    Frequency of Social Gatherings with Friends and Family: More than three times a week    Attends Religious Services: More than 4 times per year    Active Member of [employer] or Organizations: Yes    Attends Engineer, structural: More than 4 times per year    Marital Status: Married  Catering manager Violence: Not At Risk (04/18/2020)   Humiliation, Afraid, Rape, and Kick questionnaire    Fear of Current or Ex-Partner: No    Emotionally Abused: No    Physically Abused: No    Sexually Abused: No   Current Outpatient Medications on File Prior to Visit  Medication Sig Dispense Refill   CALCIUM CITRATE PO Take 1 tablet by mouth 3 (three) times daily.     drospirenone-ethinyl estradiol (YAZ) 3-0.02 MG tablet Take 1 tablet by mouth daily. 84 tablet 0   Iron, Ferrous Sulfate, 325 (65 Fe) MG TABS Take 325 mg by mouth 2 (two) times daily. 60 tablet 3   Multiple Vitamin (MULTI-VITAMIN) tablet Take 1 tablet by mouth daily.     nystatin cream (MYCOSTATIN) APPLY TO AFFECTED AREA TWICE A DAY 30 g 6   phentermine 15 MG capsule Take 15 mg by mouth every morning.     topiramate (TOPAMAX) 25 MG tablet Take 25 mg by mouth 2 (two) times daily.     fluconazole (DIFLUCAN) 150 MG tablet Take 1 tablet (150 mg total) by mouth daily. Take 2nd pill when you finish flagyl 2 tablet 0   metroNIDAZOLE (METROGEL) 0.75 % vaginal gel Place 1 Applicatorful vaginally at bedtime. 70 g 0   No current facility-administered medications on file prior to visit.   No Known Allergies  Review of Systems Constitutional: No recent fever/chills/sweats Respiratory: No recent cough/bronchitis Cardiovascular: No chest pain Gastrointestinal: No recent nausea/vomiting/diarrhea Genitourinary: No UTI symptoms Hematologic/lymphatic:No history of coagulopathy or recent blood thinner use    Objective:    Blood pressure 131/76, pulse (!) 105, height 5\' 3"  (1.6 m), weight 177 lb (80.3 kg), last menstrual period 11/12/2023, currently breastfeeding. CONSTITUTIONAL: Well-developed, well-nourished female in no acute distress.  HENT:  Normocephalic, atraumatic, External right and left ear normal. Oropharynx is clear and moist EYES: Conjunctivae and EOM are normal. Pupils are equal, round, and reactive to light. No scleral icterus.  NECK: Normal range of motion, supple, no masses SKIN: Skin is warm and dry. No rash noted. Not diaphoretic. No erythema. No pallor. NEUROLOGIC: Alert and oriented to person, place, and  time. Normal reflexes, muscle tone coordination. No cranial nerve deficit noted. PSYCHIATRIC: Normal mood and affect. Normal behavior. Normal judgment and thought content. CARDIOVASCULAR: Normal heart rate noted, regular rhythm RESPIRATORY: Effort and breath sounds normal, no problems with respiration noted ABDOMEN: Soft, nontender, nondistended. PELVIC: Normal vaginal mucosa, cervix not friable, no CMT, uterus non-tender, mobile MUSCULOSKELETAL: Normal range of motion. No edema and no tenderness. 2+ distal pulses.  Labs: No results found for this or any previous visit (from the past 2 weeks).  Imaging Studies: US PELVIC COMPLETE WITH TRANSVAGINAL Result Date: 11/17/2023 CLINICAL DATA:  Menorrhagia for 3 years. EXAM: TRANSABDOMINAL AND TRANSVAGINAL ULTRASOUND OF PELVIS TECHNIQUE: Both transabdominal and transvaginal ultrasound examinations of the pelvis were performed. Transabdominal technique was performed for global imaging of the pelvis including uterus, ovaries, adnexal regions, and pelvic cul-de-sac. It was necessary to proceed with endovaginal exam following the transabdominal exam to visualize the endometrium and bilateral ovaries. COMPARISON:  None Available. FINDINGS: Uterus Measurements: 8.4 x 5.2 x 5.8 cm = volume: 131.7 mL. No fibroids or other mass visualized. Tiny focal  calcification in the myometrium. Endometrium Thickness: 9 mm.  No focal abnormality visualized. Right ovary Measurements: 3.6 x 2.2 x 2.2 cm = volume: 8.9 mL. Normal appearance/no adnexal mass. Left ovary Measurements: 2.9 x 1.8 x 2.1 cm = volume: 5.8 mL. Normal appearance/no adnexal mass. Other findings No abnormal free fluid.  Nabothian cysts are noted. IMPRESSION: No acute abnormality identified. Endometrium measures 9 mm. If bleeding remains unresponsive to hormonal or medical therapy, sonohysterogram should be considered for focal lesion work-up. (Ref: Radiological Reasoning: Algorithmic Workup of Abnormal Vaginal Bleeding with Endovaginal Sonography and Sonohysterography. AJR 2008; 161:W96-04) Electronically Signed   By: Sherian Rein M.D.   On: 11/17/2023 12:20   Endometrial Biopsy Procedure Note  The patient was positioned on the exam table in the dorsal lithotomy position. Bimanual exam confirmed uterine position and size. A Graves speculum was placed into the vagina. A single toothed tenaculum was placed onto the anterior lip of the cervix. The pipette was placed into the endocervical canal and advanced to the uterine fundus. Using a piston like technique, with vacuum created by withdrawing the stylus, the endometrial specimen was obtained and transferred to the biopsy container. Minimal bleeding encountered. The procedure was well tolerated.   Uterine Position: mid   Uterine Length: 9cm   Uterine Specimen: Average  Assessment:     Bonnie Armstrong is a 43 y.o. [id number] with [facility] desiring Novasure ablation as management. She has upcoming elective cosmetic surgery on 01/16/24 and surgeon does not want her to undergo general anesthesia within the month prior. Wanting to have ablation as soon as possible.    Plan:    Counseling: Procedure, risks, reasons, benefits and complications (including injury to bowel, bladder, major blood vessel, ureter, bleeding, possibility of transfusion, infection)  reviewed in detail. Likelihood of success in alleviating the patient's condition was discussed. Routine postoperative instructions will be reviewed with the patient and her family in detail after surgery.  The patient concurred with the proposed plan, giving informed written consent for the surgery.   Patient requesting Dec 19, 2023 Preop testing ordered Instructions reviewed, including NPO after midnight We will request a pre-op consult with anesthesia for pt to discuss her concerns Post-op visit 2wks from procedure date   Julieanne Manson, DO  [facility]

## 2023-12-02 LAB — SURGICAL PATHOLOGY

## 2023-12-11 ENCOUNTER — Encounter: Payer: Self-pay | Admitting: Obstetrics

## 2023-12-12 ENCOUNTER — Other Ambulatory Visit: Payer: Self-pay

## 2023-12-12 ENCOUNTER — Encounter
Admission: RE | Admit: 2023-12-12 | Discharge: 2023-12-12 | Disposition: A | Discharge status: Home / Self Care | Source: Ambulatory Visit | Attending: Obstetrics | Admitting: Obstetrics

## 2023-12-12 DIAGNOSIS — Z01812 Encounter for preprocedural laboratory examination: Secondary | ICD-10-CM

## 2023-12-12 DIAGNOSIS — D649 Anemia, unspecified: Secondary | ICD-10-CM

## 2023-12-12 HISTORY — DX: Anemia, unspecified: D64.9

## 2023-12-12 HISTORY — DX: Panniculitis, unspecified: M79.3

## 2023-12-12 HISTORY — DX: Type 2 diabetes mellitus without complications: E11.9

## 2023-12-12 HISTORY — DX: Acquired absence of stomach (part of): Z90.3

## 2023-12-12 HISTORY — DX: Abnormal uterine and vaginal bleeding, unspecified: N93.9

## 2023-12-12 HISTORY — DX: Hyperlipidemia, unspecified: E78.5

## 2023-12-12 HISTORY — DX: Excessive and frequent menstruation with regular cycle: N92.0

## 2023-12-12 HISTORY — DX: Essential (primary) hypertension: I10

## 2023-12-12 NOTE — Patient Instructions (Signed)
 Your procedure is scheduled on:12-19-23 Monday Report to the Registration Desk on the 1st floor of the [facility].Then proceed to the 2nd floor Surgery Desk To find out your arrival time, please call [phone] between 1PM - 3PM on:12-16-23 Friday If your arrival time is 6:00 am, do not arrive before that time as the [facility] entrance doors do not open until 6:00 am.  REMEMBER: Instructions that are not followed completely may result in serious medical risk, up to and including death; or upon the discretion of your surgeon and anesthesiologist your surgery may need to be rescheduled.  Do not eat food OR drink any liquids after midnight the night before surgery.  No gum chewing or hard candies.  One week prior to surgery:Stop NOW (12-12-23) Stop Anti-inflammatories (NSAIDS) such as Advil , Aleve, Ibuprofen , Motrin , Naproxen, Naprosyn and Aspirin based products such as Excedrin, Goody's Powder, BC Powder. Stop ANY OVER THE COUNTER supplements until after surgery (Calcium , Vitamin D3, Iron -C, Multivitamin)-Continue your Ferrous Sulfate  up until the day prior to surgery  You may however, continue to take Tylenol  if needed for pain up until the day of surgery.  Stop Phentermine 7 days prior to surgery-Last dose will be 12-12-23 Monday  Continue taking all of your other prescription medications up until the day of surgery.  ON THE DAY OF SURGERY ONLY TAKE THESE MEDICATIONS WITH SIPS OF WATER: -topiramate (TOPAMAX)   No Alcohol for 24 hours before or after surgery.  No Smoking including e-cigarettes for 24 hours before surgery.  No chewable tobacco products for at least 6 hours before surgery.  No nicotine  patches on the day of surgery.  Do not use any "recreational" drugs for at least a week (preferably 2 weeks) before your surgery.  Please be advised that the combination of cocaine and anesthesia may have negative outcomes, up to and including death. If you test positive for cocaine,  your surgery will be cancelled.  On the morning of surgery brush your teeth with toothpaste and water, you may rinse your mouth with mouthwash if you wish. Do not swallow any toothpaste or mouthwash.  Do not wear jewelry, make-up, hairpins, clips or nail polish.  For welded (permanent) jewelry: bracelets, anklets, waist bands, etc.  Please have this removed prior to surgery.  If it is not removed, there is a chance that hospital personnel will need to cut it off on the day of surgery.  Do not wear lotions, powders, or perfumes.   Do not shave body hair from the neck down 48 hours before surgery.  Contact lenses, hearing aids and dentures may not be worn into surgery.  Do not bring valuables to the hospital. [facility] is not responsible for any missing/lost belongings or valuables.   Notify your doctor if there is any change in your medical condition (cold, fever, infection).  Wear comfortable clothing (specific to your surgery type) to the hospital.  After surgery, you can help prevent lung complications by doing breathing exercises.  Take deep breaths and cough every 1-2 hours. Your doctor may order a device called an Incentive Spirometer to help you take deep breaths. When coughing or sneezing, hold a pillow firmly against your incision with both hands. This is called "splinting." Doing this helps protect your incision. It also decreases belly discomfort.  If you are being admitted to the hospital overnight, leave your suitcase in the car. After surgery it may be brought to your room.  In case of increased patient census, it may  be necessary for you, the patient, to continue your postoperative care in the Same Day Surgery department.  If you are being discharged the day of surgery, you will not be allowed to drive home. You will need a responsible individual to drive you home and stay with you for 24 hours after surgery.   If you are taking public transportation, you will need to  have a responsible individual with you.  Please call the Pre-admissions Testing Dept. at [phone] if you have any questions about these instructions.  Surgery Visitation Policy:  Patients having surgery or a procedure may have two visitors.  Children under the age of 20 must have an adult with them who is not the patient.  How to Use an Incentive Spirometer An incentive spirometer is a tool that measures how well you are filling your lungs with each breath. Learning to take long, deep breaths using this tool can help you keep your lungs clear and active. This may help to reverse or lessen your chance of developing breathing (pulmonary) problems, especially infection. You may be asked to use a spirometer: After a surgery. If you have a lung problem or a history of smoking. After a long period of time when you have been unable to move or be active. If the spirometer includes an indicator to show the highest number that you have reached, your health care provider or respiratory therapist will help you set a goal. Keep a log of your progress as told by your health care provider. What are the risks? Breathing too quickly may cause dizziness or cause you to pass out. Take your time so you do not get dizzy or light-headed. If you are in pain, you may need to take pain medicine before doing incentive spirometry. It is harder to take a deep breath if you are having pain. How to use your incentive spirometer  Sit up on the edge of your bed or on a chair. Hold the incentive spirometer so that it is in an upright position. Before you use the spirometer, breathe out normally. Place the mouthpiece in your mouth. Make sure your lips are closed tightly around it. Breathe in slowly and as deeply as you can through your mouth, causing the piston or the ball to rise toward the top of the chamber. Hold your breath for 3-5 seconds, or for as long as possible. If the spirometer includes a coach indicator,  use this to guide you in breathing. Slow down your breathing if the indicator goes above the marked areas. Remove the mouthpiece from your mouth and breathe out normally. The piston or ball will return to the bottom of the chamber. Rest for a few seconds, then repeat the steps 10 or more times. Take your time and take a few normal breaths between deep breaths so that you do not get dizzy or light-headed. Do this every 1-2 hours when you are awake. If the spirometer includes a goal marker to show the highest number you have reached (best effort), use this as a goal to work toward during each repetition. After each set of 10 deep breaths, cough a few times. This will help to make sure that your lungs are clear. If you have an incision on your chest or abdomen from surgery, place a pillow or a rolled-up towel firmly against the incision when you cough. This can help to reduce pain while taking deep breaths and coughing. General tips When you are able to get out of  bed: Walk around often. Continue to take deep breaths and cough in order to clear your lungs. Keep using the incentive spirometer until your health care provider says it is okay to stop using it. If you have been in the hospital, you may be told to keep using the spirometer at home. Contact a health care provider if: You are having difficulty using the spirometer. You have trouble using the spirometer as often as instructed. Your pain medicine is not giving enough relief for you to use the spirometer as told. You have a fever. Get help right away if: You develop shortness of breath. You develop a cough with bloody mucus from the lungs. You have fluid or blood coming from an incision site after you cough. Summary An incentive spirometer is a tool that can help you learn to take long, deep breaths to keep your lungs clear and active. You may be asked to use a spirometer after a surgery, if you have a lung problem or a history of smoking,  or if you have been inactive for a long period of time. Use your incentive spirometer as instructed every 1-2 hours while you are awake. If you have an incision on your chest or abdomen, place a pillow or a rolled-up towel firmly against your incision when you cough. This will help to reduce pain. Get help right away if you have shortness of breath, you cough up bloody mucus, or blood comes from your incision when you cough. This information is not intended to replace advice given to you by your health care provider. Make sure you discuss any questions you have with your health care provider. Document Revised: 06/10/2023 Document Reviewed: 06/10/2023 Elsevier Patient Education  2024 [employer].

## 2023-12-15 ENCOUNTER — Encounter
Admission: RE | Admit: 2023-12-15 | Discharge: 2023-12-15 | Disposition: A | Discharge status: Home / Self Care | Source: Ambulatory Visit | Attending: Obstetrics | Admitting: Obstetrics

## 2023-12-15 DIAGNOSIS — Z01812 Encounter for preprocedural laboratory examination: Secondary | ICD-10-CM | POA: Diagnosis not present

## 2023-12-15 DIAGNOSIS — D649 Anemia, unspecified: Secondary | ICD-10-CM | POA: Diagnosis not present

## 2023-12-15 LAB — CBC
HCT: 31.8 % — ABNORMAL LOW (ref 36.0–46.0)
Hemoglobin: 9.6 g/dL — ABNORMAL LOW (ref 12.0–15.0)
MCH: 21.6 pg — ABNORMAL LOW (ref 26.0–34.0)
MCHC: 30.2 g/dL (ref 30.0–36.0)
MCV: 71.5 fL — ABNORMAL LOW (ref 80.0–100.0)
Platelets: 285 10*3/uL (ref 150–400)
RBC: 4.45 MIL/uL (ref 3.87–5.11)
RDW: 17.1 % — ABNORMAL HIGH (ref 11.5–15.5)
WBC: 5.7 10*3/uL (ref 4.0–10.5)
nRBC: 0 % (ref 0.0–0.2)

## 2023-12-19 ENCOUNTER — Other Ambulatory Visit: Payer: Self-pay

## 2023-12-19 ENCOUNTER — Ambulatory Visit: Payer: Self-pay | Admitting: Urgent Care

## 2023-12-19 ENCOUNTER — Encounter: Payer: Self-pay | Admitting: Obstetrics

## 2023-12-19 ENCOUNTER — Ambulatory Visit
Admission: RE | Admit: 2023-12-19 | Discharge: 2023-12-19 | Disposition: A | Discharge status: Home / Self Care | Attending: Obstetrics | Admitting: Obstetrics

## 2023-12-19 ENCOUNTER — Encounter
Admission: RE | Disposition: A | Discharge status: Home / Self Care | Payer: Self-pay | Source: Home / Self Care | Attending: Obstetrics

## 2023-12-19 ENCOUNTER — Ambulatory Visit: Admitting: Certified Registered Nurse Anesthetist

## 2023-12-19 ENCOUNTER — Encounter: Payer: Self-pay | Admitting: Family Medicine

## 2023-12-19 DIAGNOSIS — N939 Abnormal uterine and vaginal bleeding, unspecified: Secondary | ICD-10-CM | POA: Diagnosis present

## 2023-12-19 DIAGNOSIS — Z01812 Encounter for preprocedural laboratory examination: Secondary | ICD-10-CM

## 2023-12-19 DIAGNOSIS — N920 Excessive and frequent menstruation with regular cycle: Secondary | ICD-10-CM | POA: Diagnosis not present

## 2023-12-19 DIAGNOSIS — N921 Excessive and frequent menstruation with irregular cycle: Secondary | ICD-10-CM | POA: Diagnosis not present

## 2023-12-19 HISTORY — PX: HYSTEROSCOPY WITH D & C: SHX1775

## 2023-12-19 LAB — POCT PREGNANCY, URINE: Preg Test, Ur: NEGATIVE

## 2023-12-19 SURGERY — DILATATION AND CURETTAGE /HYSTEROSCOPY
Anesthesia: Monitor Anesthesia Care

## 2023-12-19 MED ORDER — KETOROLAC TROMETHAMINE 30 MG/ML IJ SOLN
INTRAMUSCULAR | Status: DC | PRN
  Administered 2023-12-19: 30 mg via INTRAVENOUS

## 2023-12-19 MED ORDER — IBUPROFEN 800 MG PO TABS: 800.0000 mg | ORAL_TABLET | Freq: Three times a day (TID) | ORAL | 0 refills | Status: DC | PRN

## 2023-12-19 MED ORDER — MIDAZOLAM HCL 2 MG/2ML IJ SOLN
INTRAMUSCULAR | Status: AC
  Filled 2023-12-19: qty 2

## 2023-12-19 MED ORDER — DEXMEDETOMIDINE HCL IN NACL 80 MCG/20ML IV SOLN
INTRAVENOUS | Status: DC | PRN
  Administered 2023-12-19: 8 ug via INTRAVENOUS
  Administered 2023-12-19: 12 ug via INTRAVENOUS

## 2023-12-19 MED ORDER — SODIUM CHLORIDE 0.9% FLUSH: 3.0000 mL | INTRAVENOUS | Status: DC | PRN

## 2023-12-19 MED ORDER — FENTANYL CITRATE (PF) 100 MCG/2ML IJ SOLN: 25.0000 ug | INTRAMUSCULAR | Status: DC | PRN

## 2023-12-19 MED ORDER — LIDOCAINE HCL (PF) 2 % IJ SOLN
INTRAMUSCULAR | Status: AC
  Filled 2023-12-19: qty 5

## 2023-12-19 MED ORDER — LACTATED RINGERS IV SOLN: INTRAVENOUS | Status: DC

## 2023-12-19 MED ORDER — ACETAMINOPHEN 500 MG PO TABS
ORAL_TABLET | ORAL | Status: AC
  Filled 2023-12-19: qty 2

## 2023-12-19 MED ORDER — ONDANSETRON HCL 4 MG/2ML IJ SOLN
INTRAMUSCULAR | Status: AC
  Filled 2023-12-19: qty 2

## 2023-12-19 MED ORDER — SODIUM CHLORIDE 0.9% FLUSH: 3.0000 mL | Freq: Two times a day (BID) | INTRAVENOUS | Status: DC

## 2023-12-19 MED ORDER — PROPOFOL 10 MG/ML IV BOLUS
INTRAVENOUS | Status: DC | PRN
  Administered 2023-12-19: 20 mg via INTRAVENOUS
  Administered 2023-12-19 (×2): 30 mg via INTRAVENOUS
  Administered 2023-12-19: 20 mg via INTRAVENOUS

## 2023-12-19 MED ORDER — DEXAMETHASONE SODIUM PHOSPHATE 10 MG/ML IJ SOLN
INTRAMUSCULAR | Status: DC | PRN
  Administered 2023-12-19: 10 mg via INTRAVENOUS

## 2023-12-19 MED ORDER — FENTANYL CITRATE (PF) 100 MCG/2ML IJ SOLN
INTRAMUSCULAR | Status: DC | PRN
  Administered 2023-12-19 (×2): 50 ug via INTRAVENOUS

## 2023-12-19 MED ORDER — 0.9 % SODIUM CHLORIDE (POUR BTL) OPTIME
TOPICAL | Status: DC | PRN
  Administered 2023-12-19: 500 mL

## 2023-12-19 MED ORDER — DEXAMETHASONE SODIUM PHOSPHATE 10 MG/ML IJ SOLN
INTRAMUSCULAR | Status: AC
  Filled 2023-12-19: qty 1

## 2023-12-19 MED ORDER — FENTANYL CITRATE (PF) 100 MCG/2ML IJ SOLN
INTRAMUSCULAR | Status: AC
  Filled 2023-12-19: qty 2

## 2023-12-19 MED ORDER — OXYCODONE HCL 5 MG PO TABS: 5.0000 mg | ORAL_TABLET | Freq: Once | ORAL | Status: DC | PRN

## 2023-12-19 MED ORDER — MIDAZOLAM HCL 2 MG/2ML IJ SOLN
INTRAMUSCULAR | Status: DC | PRN
  Administered 2023-12-19 (×2): 2 mg via INTRAVENOUS

## 2023-12-19 MED ORDER — ORAL CARE MOUTH RINSE: 15.0000 mL | Freq: Once | OROMUCOSAL | Status: AC

## 2023-12-19 MED ORDER — CHLORHEXIDINE GLUCONATE 0.12 % MT SOLN
OROMUCOSAL | Status: AC
  Filled 2023-12-19: qty 15

## 2023-12-19 MED ORDER — PROPOFOL 10 MG/ML IV BOLUS
INTRAVENOUS | Status: AC
  Filled 2023-12-19: qty 20

## 2023-12-19 MED ORDER — OXYCODONE HCL 5 MG/5ML PO SOLN: 5.0000 mg | Freq: Once | ORAL | Status: DC | PRN

## 2023-12-19 MED ORDER — ACETAMINOPHEN 500 MG PO TABS
1000.0000 mg | ORAL_TABLET | ORAL | Status: AC
  Administered 2023-12-19: 1000 mg via ORAL

## 2023-12-19 MED ORDER — CHLORHEXIDINE GLUCONATE 0.12 % MT SOLN
15.0000 mL | Freq: Once | OROMUCOSAL | Status: AC
  Administered 2023-12-19: 15 mL via OROMUCOSAL

## 2023-12-19 MED ORDER — ONDANSETRON HCL 4 MG/2ML IJ SOLN
INTRAMUSCULAR | Status: DC | PRN
  Administered 2023-12-19: 4 mg via INTRAVENOUS

## 2023-12-19 SURGICAL SUPPLY — 25 items
ABLATOR SURESOUND NOVASURE (ABLATOR) IMPLANT
BAG URINE DRAIN 2000ML AR STRL (UROLOGICAL SUPPLIES) IMPLANT
BASIN KIT SINGLE STR (MISCELLANEOUS) IMPLANT
CATH URTH 16FR FL 2W BLN LF (CATHETERS) IMPLANT
DEVICE MYOSURE LITE (MISCELLANEOUS) IMPLANT
DEVICE MYOSURE REACH (MISCELLANEOUS) IMPLANT
DRSG TELFA 3X8 NADH STRL (GAUZE/BANDAGES/DRESSINGS) IMPLANT
ELECTRODE REM PT RTRN 9FT ADLT (ELECTROSURGICAL) ×1 IMPLANT
GLOVE BIO SURGEON STRL SZ 6 (GLOVE) ×1 IMPLANT
GLOVE BIOGEL PI IND STRL 6 (GLOVE) ×1 IMPLANT
GOWN STRL REUS W/ TWL LRG LVL3 (GOWN DISPOSABLE) ×2 IMPLANT
KIT PROCEDURE FLUENT (KITS) ×1 IMPLANT
KIT TURNOVER CYSTO (KITS) ×1 IMPLANT
MANIFOLD NEPTUNE II (INSTRUMENTS) ×1 IMPLANT
PACK DNC HYST (MISCELLANEOUS) ×1 IMPLANT
PAD OB MATERNITY 11 LF (PERSONAL CARE ITEMS) ×1 IMPLANT
PAD PREP OB/GYN DISP 24X41 (PERSONAL CARE ITEMS) ×1 IMPLANT
SCRUB CHG 4% DYNA-HEX 4OZ (MISCELLANEOUS) ×1 IMPLANT
SEAL ROD LENS SCOPE MYOSURE (ABLATOR) ×1 IMPLANT
SET CYSTO W/LG BORE CLAMP LF (SET/KITS/TRAYS/PACK) IMPLANT
SOL .9 NS 3000ML IRR UROMATIC (IV SOLUTION) ×1 IMPLANT
SURGILUBE 2OZ TUBE FLIPTOP (MISCELLANEOUS) ×1 IMPLANT
TRAP FLUID SMOKE EVACUATOR (MISCELLANEOUS) ×1 IMPLANT
TUBING CONNECTING 10 (TUBING) ×1 IMPLANT
WATER STERILE IRR 500ML POUR (IV SOLUTION) ×1 IMPLANT

## 2023-12-19 NOTE — Anesthesia Procedure Notes (Signed)
 Procedure Name: MAC Date/Time: 12/19/2023 10:50 AM  Performed by: Angelia Kelp, CRNAPre-anesthesia Checklist: Patient identified, Emergency Drugs available, Suction available, Patient being monitored and Timeout performed Patient Re-evaluated:Patient Re-evaluated prior to induction Oxygen Delivery Method: Simple face mask Preoxygenation: Pre-oxygenation with 100% oxygen Induction Type: IV induction

## 2023-12-19 NOTE — Anesthesia Postprocedure Evaluation (Signed)
 Anesthesia Post Note  Patient: Bonnie Armstrong  Procedure(s) Performed: DILATATION AND CURETTAGE /HYSTEROSCOPY  Patient location during evaluation: PACU Anesthesia Type: MAC Level of consciousness: awake and alert Pain management: pain level controlled Vital Signs Assessment: post-procedure vital signs reviewed and stable Respiratory status: spontaneous breathing, nonlabored ventilation, respiratory function stable and patient connected to nasal cannula oxygen Cardiovascular status: blood pressure returned to baseline and stable Postop Assessment: no apparent nausea or vomiting Anesthetic complications: no   No notable events documented.   Last Vitals:  Vitals:   12/19/23 1155 12/19/23 1202  BP: 111/72 123/82  Pulse: 63 (!) 58  Resp: 16 18  Temp: 36.4 C (!) 36.3 C  SpO2: 98% 100%    Last Pain:  Vitals:   12/19/23 1202  TempSrc: Temporal  PainSc: 1                  [name]

## 2023-12-19 NOTE — Op Note (Signed)
 Hysteroscopy Procedure Note  Indications: AUB, HMB, refractory to medical therapy  Pre-operative Diagnosis: Abnormal uterine bleeding, menorrhagia  Post-operative Diagnosis: same, s/p ablation  Surgeon: Surgeon(s) and Role:    * Sofia Dunn, DO - Primary   Assistants: None  Procedure:     * DILATATION & CURETTAGE/HYSTEROSCOPY WITH NOVASURE ABLATION - Choice  Anesthesia: MAC  Procedure Details: Patient was seen in the preoperative holding area where the consent was reviewed. Patient was consented for the following procedure: Hysteroscopy Dilation and Curettage with NovaSure endometrial ablation. Pt was taken to the operating room # 5.    The patient was then placed under general anesthesia without difficulty.  She was then prepped and draped in the normal sterile fashion, and placed in the dorsal lithotomy position.  A time out was performed.  An exam under anesthesia was performed with the findings noted above.  Straight catheterization was performed. A sterile speculum was inserted into vagina. A single-tooth tenaculum was used to grasp the anterior lip of the cervix. Uterus was sounded to 10cm. Cervical dilation was performed. A 5 mm hysteroscope was introduced into the uterus under direct visualization. The cavity was allowed to fill, and then the entire cavity was explored with the findings described above. The hysteroscope was removed.   Next the NovaSure instrument was primed per instructions. Internal uterine length was 5.5cm and width was 3.0cm. The instrument was then placed into the endometrial canal and activated.  Total ablation time was 1 min 36 sec.  The NovaSure instrument was then removed from the uterine cavity.  The hysteroscope was then re-introduced for final survey, with adequate charring of the endometrium noted.  The hysteroscope was removed from the patient's uterine cavity. The tenaculum was removed and excellent hemostasis was noted. The speculum was removed from the  vagina.   All instrument and sponge counts were correct at the end of the procedure x 2.  The patient tolerated the procedure well.  She was awakened and taken to the PACU in stable condition.   Findings: Uterus sounded to 10 cm.  Cervical length 4.5 cm.  Proliferative endometrium.  Tubal ostia were visualized bilaterally.  No intrauterine masses.  Total ablation time 1 min 36 secs.   Adequate charring of endometrial tissue post ablation.  No perforations noted.   Estimated Blood Loss:  <5 ml      Drains: straight catheterization with 200 cc at start of procedure.          Total IV Fluids:  ; deficit 65mL  Specimens:  None         Implants: None         Complications:  None; patient tolerated the procedure well.         Disposition: PACU - hemodynamically stable.         Condition: stable   Sofia Dunn, DO [facility]

## 2023-12-19 NOTE — Transfer of Care (Signed)
 Immediate Anesthesia Transfer of Care Note  Patient: Bonnie Armstrong  Procedure(s) Performed: DILATATION AND CURETTAGE /HYSTEROSCOPY  Patient Location:  PACU Anesthesia Type:MAC  Level of Consciousness: awake, alert , and oriented  Airway & Oxygen Therapy: Patient Spontanous Breathing  Post-op Assessment: Report given to RN and Post -op Vital signs reviewed and stable  Post vital signs: Reviewed and stable  Last Vitals:  Vitals Value Taken Time  BP 121/83 12/19/23 1133  Temp 36.4 C 12/19/23 1133  Pulse 66 12/19/23 1138  Resp 11 12/19/23 1138  SpO2 94 % 12/19/23 1138  Vitals shown include unfiled device data.  Last Pain:  Vitals:   12/19/23 0930  TempSrc:   PainSc: 0-No pain         Complications: No notable events documented.

## 2023-12-19 NOTE — Anesthesia Preprocedure Evaluation (Addendum)
 Anesthesia Evaluation  Patient identified by MRN, date of birth, ID band Patient awake    Reviewed: Allergy & Precautions, NPO status , Patient's Chart, lab work & pertinent test results  History of Anesthesia Complications Negative for: history of anesthetic complications  Airway Mallampati: II  TM Distance: >3 FB Neck ROM: full    Dental no notable dental hx.    Pulmonary neg pulmonary ROS   Pulmonary exam normal        Cardiovascular Normal cardiovascular exam     Neuro/Psych negative neurological ROS  negative psych ROS   GI/Hepatic negative GI ROS, Neg liver ROS,,,  Endo/Other    Renal/GU negative Renal ROS  negative genitourinary   Musculoskeletal   Abdominal   Peds  Hematology  (+) Blood dyscrasia, anemia   Anesthesia Other Findings Past Medical History: No date: Abnormal uterine bleeding (AUB) No date: Anemia No date: DM (diabetes mellitus), type 2 (HCC)     Comment:  had bariatric surgery and last weight and a1c is now 5.3              as of 10-10-23-no meds No date: Gestational diabetes No date: Hyperlipidemia No date: Hypertension     Comment:  had bariatric surgery lost weight and was able to come               off bp meds No date: IFG (impaired fasting glucose) No date: Menorrhagia with regular cycle 02/28/2015: Morbid obesity (HCC) No date: Panniculitis No date: S/P gastric sleeve procedure  Past Surgical History: 02/26/2022: BARIATRIC SURGERY 08/16/2000: CESAREAN SECTION No date: WISDOM TOOTH EXTRACTION  BMI    Body Mass Index: 31.35 kg/m      Reproductive/Obstetrics negative OB ROS                             Anesthesia Physical Anesthesia Plan  ASA: 2  Anesthesia Plan: MAC   Post-op Pain Management: Tylenol  PO (pre-op)*   Induction: Intravenous  PONV Risk Score and Plan: 2 and Midazolam and Treatment may vary due to age or medical  condition  Airway Management Planned: LMA  Additional Equipment:   Intra-op Plan:   Post-operative Plan: Extubation in OR  Informed Consent: I have reviewed the patients History and Physical, chart, labs and discussed the procedure including the risks, benefits and alternatives for the proposed anesthesia with the patient or authorized representative who has indicated his/her understanding and acceptance.     Dental Advisory Given  Plan Discussed with: Anesthesiologist, CRNA and Surgeon  Anesthesia Plan Comments: (Patient consented for risks of anesthesia including but not limited to:  - adverse reactions to medications - damage to eyes, teeth, lips or other oral mucosa - nerve damage due to positioning  - sore throat or hoarseness - Damage to heart, brain, nerves, lungs, other parts of body or loss of life  Patient voiced understanding and assent.  Patient requesting like light sedation for case.)        Anesthesia Quick Evaluation

## 2023-12-19 NOTE — Interval H&P Note (Signed)
 History and Physical Interval Note:  12/19/2023 10:25 AM  Bonnie Armstrong  has presented today for surgery, with the diagnosis of AUB,Menorrhagia.  The various methods of treatment have been discussed with the patient and family. After consideration of risks, benefits and other options for treatment, the patient has consented to  Procedure(s) with comments: DILATATION AND CURETTAGE /HYSTEROSCOPY (N/A) - Novasure Ablation as a surgical intervention.  The patient's history has been reviewed, patient examined, no change in status, stable for surgery.  I have reviewed the patient's chart and labs.  Questions were answered to the patient's satisfaction.     [name]

## 2023-12-20 ENCOUNTER — Encounter: Payer: Self-pay | Admitting: Obstetrics

## 2023-12-21 ENCOUNTER — Encounter (HOSPITAL_COMMUNITY): Payer: Self-pay

## 2023-12-28 ENCOUNTER — Ambulatory Visit (INDEPENDENT_AMBULATORY_CARE_PROVIDER_SITE_OTHER): Payer: 59 | Admitting: Family Medicine

## 2023-12-28 ENCOUNTER — Ambulatory Visit
Admission: RE | Admit: 2023-12-28 | Discharge: 2023-12-28 | Disposition: A | Discharge status: Home / Self Care | Source: Ambulatory Visit | Attending: Family Medicine | Admitting: Family Medicine

## 2023-12-28 VITALS — BP 129/83 | HR 90 | Ht 63.0 in | Wt 174.0 lb

## 2023-12-28 DIAGNOSIS — D500 Iron deficiency anemia secondary to blood loss (chronic): Secondary | ICD-10-CM | POA: Diagnosis not present

## 2023-12-28 DIAGNOSIS — Z903 Acquired absence of stomach [part of]: Secondary | ICD-10-CM | POA: Diagnosis not present

## 2023-12-28 DIAGNOSIS — Z1231 Encounter for screening mammogram for malignant neoplasm of breast: Secondary | ICD-10-CM | POA: Insufficient documentation

## 2023-12-28 NOTE — Progress Notes (Signed)
 BP 129/83 (BP Location: Left Arm, Patient Position: Sitting, Cuff Size: Normal)   Pulse 90   Ht 5\' 3"  (1.6 m)   Wt 174 lb (78.9 kg)   LMP 12/12/2023 (Approximate)   SpO2 99%   BMI 30.82 kg/m    Subjective:    Patient ID: Bonnie Armstrong, female    DOB: 27-Oct-1980, 43 y.o.   MRN: [id number]  HPI: Bonnie Armstrong is a 43 y.o. female  Chief Complaint  Patient presents with   Anemia   ANEMIA- doing well following her ablation. Has not had any bleeding Anemia status: unsure Etiology of anemia: iron  deficiency due to menorrhagia Duration of anemia treatment: chronic Compliance with treatment: excellent compliance Iron  supplementation side effects: no Severity of anemia: moderate Fatigue: yes Decreased exercise tolerance: yes  Dyspnea on exertion: no Palpitations: no Bleeding: no Pica: no  Relevant past medical, surgical, family and social history reviewed and updated as indicated. Interim medical history since our last visit reviewed. Allergies and medications reviewed and updated.  Review of Systems  Constitutional: Negative.   Respiratory: Negative.    Cardiovascular: Negative.   Musculoskeletal: Negative.   Neurological: Negative.   Psychiatric/Behavioral: Negative.      Per HPI unless specifically indicated above     Objective:     BP 129/83 (BP Location: Left Arm, Patient Position: Sitting, Cuff Size: Normal)   Pulse 90   Ht 5\' 3"  (1.6 m)   Wt 174 lb (78.9 kg)   LMP 12/12/2023 (Approximate)   SpO2 99%   BMI 30.82 kg/m   Wt Readings from Last 3 Encounters:  12/28/23 174 lb (78.9 kg)  12/19/23 177 lb (80.3 kg)  11/30/23 177 lb (80.3 kg)    Physical Exam Vitals and nursing note reviewed.  Constitutional:      General: She is not in acute distress.    Appearance: Normal appearance. She is not ill-appearing, toxic-appearing or diaphoretic.  HENT:     Head: Normocephalic and atraumatic.     Right Ear: External ear normal.     Left Ear: External  ear normal.     Nose: Nose normal.     Mouth/Throat:     Mouth: Mucous membranes are moist.     Pharynx: Oropharynx is clear.  Eyes:     General: No scleral icterus.       Right eye: No discharge.        Left eye: No discharge.     Extraocular Movements: Extraocular movements intact.     Conjunctiva/sclera: Conjunctivae normal.     Pupils: Pupils are equal, round, and reactive to light.  Cardiovascular:     Rate and Rhythm: Normal rate and regular rhythm.     Pulses: Normal pulses.     Heart sounds: Normal heart sounds. No murmur heard.    No friction rub. No gallop.  Pulmonary:     Effort: Pulmonary effort is normal. No respiratory distress.     Breath sounds: Normal breath sounds. No stridor. No wheezing, rhonchi or rales.  Chest:     Chest wall: No tenderness.  Musculoskeletal:        General: Normal range of motion.     Cervical back: Normal range of motion and neck supple.  Skin:    General: Skin is warm and dry.     Capillary Refill: Capillary refill takes less than 2 seconds.     Coloration: Skin is not jaundiced or pale.     Findings: No bruising,  erythema, lesion or rash.  Neurological:     General: No focal deficit present.     Mental Status: She is alert and oriented to person, place, and time. Mental status is at baseline.  Psychiatric:        Mood and Affect: Mood normal.        Behavior: Behavior normal.        Thought Content: Thought content normal.        Judgment: Judgment normal.     Results for orders placed or performed during the hospital encounter of 12/19/23  Pregnancy, urine POC   Collection Time: 12/19/23 10:40 AM  Result Value Ref Range   Preg Test, Ur NEGATIVE NEGATIVE      Assessment & Plan:   Problem List Items Addressed This Visit       Other   S/P gastric sleeve procedure   Checking labs today. Await results. Treat as needed.       Relevant Orders   Basic metabolic panel with GFR   Prealbumin   Other Visit Diagnoses        Iron  deficiency anemia due to chronic blood loss    -  Primary   Rechecking labs today. Await results. Treat as needed.   Relevant Orders   CBC with Differential/Platelet   Ferritin   Iron  Binding Cap (TIBC)([facility])   B12        Follow up plan: Return in about 3 months (around 03/29/2024).

## 2023-12-28 NOTE — Assessment & Plan Note (Signed)
 Checking labs today. Await results. Treat as needed.

## 2023-12-29 ENCOUNTER — Encounter: Payer: Self-pay | Admitting: Student

## 2023-12-29 ENCOUNTER — Ambulatory Visit (INDEPENDENT_AMBULATORY_CARE_PROVIDER_SITE_OTHER): Payer: 59 | Admitting: Student

## 2023-12-29 ENCOUNTER — Encounter: Payer: Self-pay | Admitting: Family Medicine

## 2023-12-29 VITALS — BP 136/81 | HR 108 | Ht 63.0 in | Wt 173.4 lb

## 2023-12-29 DIAGNOSIS — M793 Panniculitis, unspecified: Secondary | ICD-10-CM

## 2023-12-29 LAB — BASIC METABOLIC PANEL WITH GFR
BUN/Creatinine Ratio: 19 (ref 9–23)
BUN: 14 mg/dL (ref 6–24)
CO2: 21 mmol/L (ref 20–29)
Calcium: 8.9 mg/dL (ref 8.7–10.2)
Chloride: 105 mmol/L (ref 96–106)
Creatinine, Ser: 0.74 mg/dL (ref 0.57–1.00)
Glucose: 98 mg/dL (ref 70–99)
Potassium: 4.2 mmol/L (ref 3.5–5.2)
Sodium: 138 mmol/L (ref 134–144)
eGFR: 102 mL/min/{1.73_m2} (ref >59)

## 2023-12-29 LAB — VITAMIN B12: Vitamin B-12: >2000 pg/mL — ABNORMAL HIGH (ref 232–1245)

## 2023-12-29 LAB — IRON AND TIBC
Iron Saturation: 4 % — CL (ref 15–55)
Iron: 16 ug/dL — ABNORMAL LOW (ref 27–159)
Total Iron Binding Capacity: 409 ug/dL (ref 250–450)
UIBC: 393 ug/dL (ref 131–425)

## 2023-12-29 LAB — CBC WITH DIFFERENTIAL/PLATELET
Basophils Absolute: 0 10*3/uL (ref 0.0–0.2)
Basos: 1 %
EOS (ABSOLUTE): 0.1 10*3/uL (ref 0.0–0.4)
Eos: 1 %
Hematocrit: 35.6 % (ref 34.0–46.6)
Hemoglobin: 10.4 g/dL — ABNORMAL LOW (ref 11.1–15.9)
Immature Grans (Abs): 0 10*3/uL (ref 0.0–0.1)
Immature Granulocytes: 0 %
Lymphocytes Absolute: 1.5 10*3/uL (ref 0.7–3.1)
Lymphs: 27 %
MCH: 21.7 pg — ABNORMAL LOW (ref 26.6–33.0)
MCHC: 29.2 g/dL — ABNORMAL LOW (ref 31.5–35.7)
MCV: 74 fL — ABNORMAL LOW (ref 79–97)
Monocytes Absolute: 0.4 10*3/uL (ref 0.1–0.9)
Monocytes: 7 %
Neutrophils Absolute: 3.4 10*3/uL (ref 1.4–7.0)
Neutrophils: 64 %
Platelets: 328 10*3/uL (ref 150–450)
RBC: 4.8 x10E6/uL (ref 3.77–5.28)
RDW: 16.4 % — ABNORMAL HIGH (ref 11.7–15.4)
WBC: 5.4 10*3/uL (ref 3.4–10.8)

## 2023-12-29 LAB — PREALBUMIN: PREALBUMIN: 15 mg/dL (ref 12–34)

## 2023-12-29 LAB — FERRITIN: Ferritin: 5 ng/mL — ABNORMAL LOW (ref 15–150)

## 2023-12-29 MED ORDER — CEPHALEXIN 500 MG PO CAPS: 500.0000 mg | ORAL_CAPSULE | Freq: Four times a day (QID) | ORAL | 0 refills | Status: AC

## 2023-12-29 MED ORDER — OXYCODONE HCL 5 MG PO TABS: 5.0000 mg | ORAL_TABLET | Freq: Four times a day (QID) | ORAL | 0 refills | Status: DC | PRN

## 2023-12-29 MED ORDER — ONDANSETRON HCL 4 MG PO TABS: 4.0000 mg | ORAL_TABLET | Freq: Three times a day (TID) | ORAL | 0 refills | Status: DC | PRN

## 2023-12-29 NOTE — Progress Notes (Signed)
 Patient ID: Bonnie Armstrong, female    DOB: Nov 05, 1980, 43 y.o.   MRN: [id number]  Chief Complaint  Patient presents with   Pre-op Exam      ICD-10-CM   1. Panniculitis  M79.3        History of Present Illness: Bonnie Armstrong is a 43 y.o.  female  with a history of panniculitis.  She presents for preoperative evaluation for upcoming procedure, panniculectomy with upper abdominal add-on and monsplasty, scheduled for 01/18/2024 with Dr. Orin Birk.  The patient has not had problems with anesthesia.  Patient denies any history of cardiac disease.  She states that several years ago, she had palpitations, but had this thoroughly investigated and was not found to have any problems and has not had any issues since then.  She states that she does not follow-up with a cardiologist.  She denies taking any blood thinners.  Patient reports she is not a smoker.  Patient denies taking any birth control or hormone replacement.  She does report history of 1 miscarriage.  She denies any personal family history of blood clots or clotting diseases.  She does reports she had a uterine ablation about a week and a half ago.  She denies any other recent surgeries, traumas or infections.  She denies any history of stroke or heart attack.  She denies any history of Crohn's disease, ulcerative colitis, COPD or asthma.  She denies any history of cancer.  She denies any varicosities to her lower extremities.  She denies any recent fevers, chills or changes in her health other than the uterine ablation.  Patient states that she had a lab work done recently and her hemoglobin yesterday was 10.4.  She states that this was improved from her prior hemoglobin which was done about 2 weeks ago and was 9.6 at that time.  Patient states that she is taking iron  and that she has been following up closely with her PCP on this.  She reports that she had the uterine ablation because of heavy periods.  She states that she feels  things have improved since the ablation.  Summary of Previous Visit: Patient was seen for initial consult by Dr. Orin Birk on 09/16/2023.  At this visit, it was noted that patient had decreased her weight from 235 pounds to 195 pounds with bariatric surgery done in 2023.  Patient reported that she had a lot of rashes and skin breakdown.  The patient was found to be a good candidate for panniculectomy.  Patient then requested for a mons plasty to be added on.  Patient then had another appointment on 11/04/2023 with Dr. Orin Birk.  Patient wanted to discuss panniculectomy with add-on abdominoplasty.  Plan was for to move forward with panniculectomy with add-on abdominoplasty and liposuction.  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 [place] Calcium  Citrate, Disp: , Rfl:    cephALEXin  (KEFLEX ) 500 MG capsule, Take 1 capsule (500 mg total) by mouth 4 (four) times daily for 3 days., Disp: 12 capsule, Rfl: 0   Cholecalciferol (FT VITAMIN D3) 125 MCG (5000 UT) TABS, Take 5,000 Units by mouth daily with lunch. Bariactric Advantage Vitamin D  Capsule 5,000 IU, Disp: , Rfl:  Ferrous Gluconate-C-Folic Acid  (IRON -C PO), Take 1 tablet by mouth in the morning. Bariatric Fusion Iron  Supplement 60mg  with Vitamin C, Disp: , Rfl:    ibuprofen  (ADVIL ) 800 MG tablet, Take 1 tablet (800 mg total) by mouth every 8 (eight) hours as needed for cramping., Disp: 30 tablet, Rfl: 0   Iron , Ferrous Sulfate , 325 (65 Fe) MG TABS, Take 325 mg by mouth 2 (two) times daily. (Patient taking differently: Take 325 mg by mouth in the morning.), Disp: 60 tablet, Rfl: 3   Multiple Vitamins-Minerals (BARIATRIC  MULTIVITAMIN/IRON  PO), Take 1 tablet by mouth in the morning. BariMelts Once Daily Bariatric Multivitamin with Iron , Disp: , Rfl:    ondansetron  (ZOFRAN ) 4 MG tablet, Take 1 tablet (4 mg total) by mouth every 8 (eight) hours as needed for up to 20 doses for nausea or vomiting., Disp: 20 tablet, Rfl: 0   oxyCODONE  (ROXICODONE ) 5 MG immediate release tablet, Take 1 tablet (5 mg total) by mouth every 6 (six) hours as needed for up to 20 doses for severe pain (pain score 7-10)., Disp: 20 tablet, Rfl: 0   phentermine 30 MG capsule, Take 30 mg by mouth every morning., Disp: , Rfl:    topiramate (TOPAMAX) 25 MG tablet, Take 25 mg by mouth 2 (two) times daily., Disp: , Rfl:   Past Medical Problems: Past Medical History:  Diagnosis Date   Abnormal uterine bleeding (AUB)    Anemia    DM (diabetes mellitus), type 2 (HCC)    had bariatric surgery and last weight and a1c is now 5.3 as of 10-10-23-no meds   Gestational diabetes    Hyperlipidemia    Hypertension    had bariatric surgery lost weight and was able to come off bp meds   IFG (impaired fasting glucose)    Menorrhagia with regular cycle    Morbid obesity (HCC) 02/28/2015   Panniculitis    S/P gastric sleeve procedure     Past Surgical History: Past Surgical History:  Procedure Laterality Date   BARIATRIC SURGERY  02/26/2022   CESAREAN SECTION  08/16/2000   HYSTEROSCOPY WITH D & C N/A 12/19/2023   Procedure: DILATATION AND CURETTAGE /HYSTEROSCOPY;  Surgeon: Sofia Dunn, MD;  Location: [facility];  Service: Gynecology;  Laterality: N/A;  Novasure Ablation   WISDOM TOOTH EXTRACTION      Social History: Social History   Socioeconomic History   Marital status: Married    Spouse name: Not on file   Number of children: Not on file   Years of education: Not on file   Highest education level: Bachelor's degree (e.g., BA, AB, BS)  Occupational History   Not on file  Tobacco Use   Smoking status: Never   Smokeless tobacco: Never  Vaping  Use   Vaping status: Never Used  Substance and Sexual Activity   Alcohol use: No   Drug use: No   Sexual activity: Yes    Birth control/protection: None  Other Topics Concern   Not on file  Social History Narrative   Not on file   Social Drivers of Health   Financial Resource Strain: Low Risk  (12/28/2023)   Overall Financial Resource Strain (CARDIA)    Difficulty of Paying Living Expenses: Not very hard  Food Insecurity: No Food Insecurity (12/28/2023)   Hunger Vital Sign    Worried About Running Out of Food in the Last Year: Never true    Ran Out of Food in the Last Year: Never true  Transportation Needs:  No Transportation Needs (12/28/2023)   PRAPARE - Administrator, Civil Service (Medical): No    Lack of Transportation (Non-Medical): No  Physical Activity: Sufficiently Active (12/28/2023)   Exercise Vital Sign    Days of Exercise per Week: 5 days    Minutes of Exercise per Session: 30 min  Stress: No Stress Concern Present (12/28/2023)   [employer] of Occupational Health - Occupational Stress Questionnaire    Feeling of Stress : Not at all  Social Connections: Socially Integrated (12/28/2023)   Social Connection and Isolation Panel [NHANES]    Frequency of Communication with Friends and Family: More than three times a week    Frequency of Social Gatherings with Friends and Family: More than three times a week    Attends Religious Services: More than 4 times per year    Active Member of [employer] or Organizations: Yes    Attends Banker Meetings: 1 to 4 times per year    Marital Status: Married  Catering manager Violence: Not At Risk (04/18/2020)   Humiliation, Afraid, Rape, and Kick questionnaire    Fear of Current or Ex-Partner: No    Emotionally Abused: No    Physically Abused: No    Sexually Abused: No    Family History: Family History  Problem Relation Age of Onset   Hypertension Mother    Hyperlipidemia Mother    Hypothyroidism Mother     CAD Father    Heart disease Father    Diabetes Maternal Grandfather    COPD Paternal Grandmother    Cancer Paternal Grandfather        colon    Review of Systems: Denies any fevers or chills  Physical Exam: Vital Signs BP 136/81 (BP Location: Left Arm, Patient Position: Sitting, Cuff Size: Normal)   Pulse (!) 108   Ht 5\' 3"  (1.6 m)   Wt 173 lb 6.4 oz (78.7 kg)   LMP 12/12/2023 (Approximate)   SpO2 99%   BMI 30.72 kg/m   Physical Exam  Constitutional:      General: Not in acute distress.    Appearance: Normal appearance. Not ill-appearing.  HENT:     Head: Normocephalic and atraumatic.  Neck:     Musculoskeletal: Normal range of motion.  Cardiovascular:     Rate and Rhythm: Normal rate    Pulses: Normal pulses.  Pulmonary:     Effort: Pulmonary effort is normal. No respiratory distress.  Musculoskeletal: Normal range of motion.  Skin:    General: Skin is warm and dry.     Findings: No erythema or rash.  Neurological:     Mental Status: Alert and oriented to person, place, and time. Mental status is at baseline.  Psychiatric:        Mood and Affect: Mood normal.        Behavior: Behavior normal.    Assessment/Plan: The patient is scheduled for panniculectomy and upper abdominal add-on and monsplasty with Dr. Orin Birk.  Risks, benefits, and alternatives of procedure discussed, questions answered and consent obtained.    Will send clearance to patient's PCP  Smoking Status: Non-smoker; Counseling Given?  N/A  Caprini Score: 5; Risk Factors include: Age, BMI > 25, history of recent surgery and length of planned surgery. Recommendation for mechanical prophylaxis. Encourage early ambulation.   Pictures obtained: @consult   Post-op Rx sent to pharmacy: Oxycodone , Zofran , Keflex   Instructed patient to hold multivitamins, supplements and ibuprofen  at least 1 week prior to surgery. I instructed the  patient to hold her phentermine 1 week prior to surgery.  Patient  expressed understanding.  Patient was provided with the General Surgical Risk consent document and Pain Medication Agreement prior to their appointment.  They had adequate time to read through the risk consent documents and Pain Medication Agreement. We also discussed them in person together during this preop appointment. All of their questions were answered to their satisfaction.  Recommended calling if they have any further questions.  Risk consent form and Pain Medication Agreement to be scanned into patient's chart.  The consent was obtained with risks and complications reviewed which included bleeding, pain, scar, infection and the risk of anesthesia.  The patients questions were answered to the patients expressed satisfaction.    Electronically signed by: Harden Leyden, PA-C 12/29/2023 12:51 PM

## 2023-12-30 ENCOUNTER — Ambulatory Visit: Payer: Self-pay | Admitting: Family Medicine

## 2023-12-30 DIAGNOSIS — D500 Iron deficiency anemia secondary to blood loss (chronic): Secondary | ICD-10-CM

## 2023-12-30 DIAGNOSIS — Z903 Acquired absence of stomach [part of]: Secondary | ICD-10-CM

## 2023-12-30 DIAGNOSIS — N920 Excessive and frequent menstruation with regular cycle: Secondary | ICD-10-CM

## 2024-01-03 ENCOUNTER — Inpatient Hospital Stay: Attending: Oncology | Admitting: Oncology

## 2024-01-03 ENCOUNTER — Encounter: Payer: Self-pay | Admitting: Oncology

## 2024-01-03 ENCOUNTER — Inpatient Hospital Stay

## 2024-01-03 VITALS — BP 121/80 | HR 98 | Temp 99.0°F | Resp 17 | Wt 172.0 lb

## 2024-01-03 DIAGNOSIS — D500 Iron deficiency anemia secondary to blood loss (chronic): Secondary | ICD-10-CM | POA: Insufficient documentation

## 2024-01-03 DIAGNOSIS — R002 Palpitations: Secondary | ICD-10-CM | POA: Insufficient documentation

## 2024-01-03 DIAGNOSIS — R5383 Other fatigue: Secondary | ICD-10-CM | POA: Insufficient documentation

## 2024-01-03 DIAGNOSIS — Z800 Family history of malignant neoplasm of digestive organs: Secondary | ICD-10-CM | POA: Insufficient documentation

## 2024-01-03 DIAGNOSIS — Z833 Family history of diabetes mellitus: Secondary | ICD-10-CM | POA: Diagnosis not present

## 2024-01-03 DIAGNOSIS — Z79899 Other long term (current) drug therapy: Secondary | ICD-10-CM | POA: Diagnosis not present

## 2024-01-03 DIAGNOSIS — Z83438 Family history of other disorder of lipoprotein metabolism and other lipidemia: Secondary | ICD-10-CM | POA: Insufficient documentation

## 2024-01-03 DIAGNOSIS — Z8249 Family history of ischemic heart disease and other diseases of the circulatory system: Secondary | ICD-10-CM | POA: Diagnosis not present

## 2024-01-03 DIAGNOSIS — D509 Iron deficiency anemia, unspecified: Secondary | ICD-10-CM

## 2024-01-03 DIAGNOSIS — Z9884 Bariatric surgery status: Secondary | ICD-10-CM | POA: Insufficient documentation

## 2024-01-03 DIAGNOSIS — I10 Essential (primary) hypertension: Secondary | ICD-10-CM | POA: Insufficient documentation

## 2024-01-03 DIAGNOSIS — N920 Excessive and frequent menstruation with regular cycle: Secondary | ICD-10-CM | POA: Diagnosis not present

## 2024-01-03 DIAGNOSIS — E785 Hyperlipidemia, unspecified: Secondary | ICD-10-CM | POA: Diagnosis not present

## 2024-01-03 DIAGNOSIS — R42 Dizziness and giddiness: Secondary | ICD-10-CM | POA: Insufficient documentation

## 2024-01-03 DIAGNOSIS — Z8349 Family history of other endocrine, nutritional and metabolic diseases: Secondary | ICD-10-CM | POA: Diagnosis not present

## 2024-01-03 NOTE — Progress Notes (Signed)
 Patient concerns today of fatigue and cardiac flutters

## 2024-01-03 NOTE — Assessment & Plan Note (Addendum)
 Labs are reviewed and discussed with patient. Lab Results  Component Value Date   HGB 10.4 (L) 12/28/2023   TIBC 409 12/28/2023   IRONPCTSAT 4 (LL) 12/28/2023   FERRITIN 5 (L) 12/28/2023    Lab results are consistent with iron  deficiency anemia. In light of upcoming surgery, I discussed about option of IV Venofer treatments to improve preop hemoglobin rapidly. I discussed about the potential risks including but not limited to allergic reactions/infusion reactions including anaphylactic reactions, diarrhea, phlebitis, high blood pressure, wheezing, SOB, skin rash, weight gain,dark urine, leg swelling, back pain, headache, nausea and fatigue, etc. Patient tolerates oral iron  supplement poorly and desires to achieved higher level of iron  faster for adequate hematopoesis. Plan IV venofer weekly x 4 Patient denies any chance of pregnancy.-Husband had vasectomy procedure done.

## 2024-01-03 NOTE — Progress Notes (Signed)
 Hematology/Oncology Consult note [phone] [phone]        REFERRING PROVIDER: Solomon Dupre, DO   CHIEF COMPLAINTS/REASON FOR VISIT:  Evaluation of iron  deficiency anemia.   ASSESSMENT & PLAN:   Iron  deficiency anemia due to chronic blood loss Labs are reviewed and discussed with patient. Lab Results  Component Value Date   HGB 10.4 (L) 12/28/2023   TIBC 409 12/28/2023   IRONPCTSAT 4 (LL) 12/28/2023   FERRITIN 5 (L) 12/28/2023    Lab results are consistent with iron  deficiency anemia. In light of upcoming surgery, I discussed about option of IV Venofer treatments to improve preop hemoglobin rapidly. I discussed about the potential risks including but not limited to allergic reactions/infusion reactions including anaphylactic reactions, diarrhea, phlebitis, high blood pressure, wheezing, SOB, skin rash, weight gain,dark urine, leg swelling, back pain, headache, nausea and fatigue, etc. Patient tolerates oral iron  supplement poorly and desires to achieved higher level of iron  faster for adequate hematopoesis. Plan IV venofer weekly x 4 Patient denies any chance of pregnancy.-Husband had vasectomy procedure done.  Orders Placed This Encounter  Procedures   CBC with Differential (Cancer Center Only)    Standing Status:   Future    Expected Date:   04/04/2024    Expiration Date:   01/02/2025   Ferritin    Standing Status:   Future    Expected Date:   04/04/2024    Expiration Date:   01/02/2025   Retic Panel    Standing Status:   Future    Expected Date:   04/04/2024    Expiration Date:   01/02/2025   Iron  and TIBC    Standing Status:   Future    Expected Date:   04/04/2024    Expiration Date:   01/02/2025   Follow-up in 3 months. All questions were answered. The patient knows to call the clinic with any problems, questions or concerns.  [name], MD, PhD [facility] Hematology Oncology 01/03/2024   HISTORY OF PRESENTING ILLNESS:   Bonnie Armstrong is a  43 y.o.  female with PMH listed below was seen in consultation at the request of  Lincoln Renshaw, Megan P, DO  for evaluation of iron  deficiency anemia  Discussed the use of AI scribe software for clinical note transcription with the patient, who gave verbal consent to proceed.   She reports he history of iron  deficiency anemia, since her modified duodenal switch surgery in 2023.  12/28/2023 hemoglobin 10.4, MCV 74, iron  saturation 4, TIBC 409, ferritin 5.  She has been on iron  supplements, including ferrous sulfate , since the surgery, but there has been minimal improvement in her anemia.  She underwent a modified duodenal switch surgery in 2023' Despite taking oral iron  supplements, she continues to experience low hemoglobin levels. She experiences heavy menstrual bleeding, describing it as 'an astronomical amount' that does not respond to birth control. She underwent an endometrial ablation on Dec 19, 2023, and is currently awaiting to see if it has reduced her menstrual flow.  She is scheduled for a paniculectomy and Monsplasty on January 16, 2024, due to complications from previous pregnancies and significant abdominal skin issues.  She consumes at least three cups of ice daily, which may indicate pica, a condition often associated with iron  deficiency anemia. No constipation issues that cannot be managed with over-the-counter medications, and no blood in her stool or dark stools.    MEDICAL HISTORY:  Past Medical History:  Diagnosis Date   Abnormal uterine  bleeding (AUB)    Anemia    DM (diabetes mellitus), type 2 (HCC)    had bariatric surgery and last weight and a1c is now 5.3 as of 10-10-23-no meds   Gestational diabetes    Hyperlipidemia    Hypertension    had bariatric surgery lost weight and was able to come off bp meds   IFG (impaired fasting glucose)    Menorrhagia with regular cycle    Morbid obesity (HCC) 02/28/2015   Panniculitis    S/P gastric sleeve procedure      SURGICAL HISTORY: Past Surgical History:  Procedure Laterality Date   BARIATRIC SURGERY  02/26/2022   CESAREAN SECTION  08/16/2000   HYSTEROSCOPY WITH D & C N/A 12/19/2023   Procedure: DILATATION AND CURETTAGE /HYSTEROSCOPY;  Surgeon: Sofia Dunn, MD;  Location: [facility] ORS;  Service: Gynecology;  Laterality: N/A;  Novasure Ablation   WISDOM TOOTH EXTRACTION      SOCIAL HISTORY: Social History   Socioeconomic History   Marital status: Married    Spouse name: Not on file   Number of children: Not on file   Years of education: Not on file   Highest education level: Bachelor's degree (e.g., BA, AB, BS)  Occupational History   Not on file  Tobacco Use   Smoking status: Never   Smokeless tobacco: Never  Vaping Use   Vaping status: Never Used  Substance and Sexual Activity   Alcohol use: No   Drug use: No   Sexual activity: Yes    Birth control/protection: None  Other Topics Concern   Not on file  Social History Narrative   Not on file   Social Drivers of Health   Financial Resource Strain: Low Risk  (12/28/2023)   Overall Financial Resource Strain (CARDIA)    Difficulty of Paying Living Expenses: Not very hard  Food Insecurity: No Food Insecurity (12/28/2023)   Hunger Vital Sign    Worried About Running Out of Food in the Last Year: Never true    Ran Out of Food in the Last Year: Never true  Transportation Needs: No Transportation Needs (12/28/2023)   PRAPARE - Administrator, Civil Service (Medical): No    Lack of Transportation (Non-Medical): No  Physical Activity: Sufficiently Active (12/28/2023)   Exercise Vital Sign    Days of Exercise per Week: 5 days    Minutes of Exercise per Session: 30 min  Stress: No Stress Concern Present (12/28/2023)   [employer] of Occupational Health - Occupational Stress Questionnaire    Feeling of Stress : Not at all  Social Connections: Socially Integrated (12/28/2023)   Social Connection and Isolation Panel [NHANES]     Frequency of Communication with Friends and Family: More than three times a week    Frequency of Social Gatherings with Friends and Family: More than three times a week    Attends Religious Services: More than 4 times per year    Active Member of [employer] or Organizations: Yes    Attends Banker Meetings: 1 to 4 times per year    Marital Status: Married  Catering manager Violence: Not At Risk (01/03/2024)   Humiliation, Afraid, Rape, and Kick questionnaire    Fear of Current or Ex-Partner: No    Emotionally Abused: No    Physically Abused: No    Sexually Abused: No    FAMILY HISTORY: Family History  Problem Relation Age of Onset   Hypertension Mother    Hyperlipidemia Mother  Hypothyroidism Mother    CAD Father    Heart disease Father    Diabetes Maternal Grandfather    COPD Paternal Grandmother    Cancer Paternal Grandfather        colon    ALLERGIES:  has no known allergies.  MEDICATIONS:  Current Outpatient Medications  Medication Sig Dispense Refill   CALCIUM  CITRATE PO Take 600 mg by mouth 3 (three) times daily. [place] Calcium  Citrate     Cholecalciferol (FT VITAMIN D3) 125 MCG (5000 UT) TABS Take 5,000 Units by mouth daily with lunch. Bariactric Advantage Vitamin D  Capsule 5,000 IU     Ferrous Gluconate-C-Folic Acid  (IRON -C PO) Take 1 tablet by mouth in the morning. Bariatric Fusion Iron  Supplement 60mg  with Vitamin C     ibuprofen  (ADVIL ) 800 MG tablet Take 1 tablet (800 mg total) by mouth every 8 (eight) hours as needed for cramping. 30 tablet 0   Iron , Ferrous Sulfate , 325 (65 Fe) MG TABS Take 325 mg by mouth 2 (two) times daily. (Patient taking differently: Take 325 mg by mouth in the morning.) 60 tablet 3   Multiple Vitamins-Minerals (BARIATRIC MULTIVITAMIN/IRON  PO) Take 1 tablet by mouth in the morning. BariMelts Once Daily Bariatric Multivitamin with Iron      ondansetron  (ZOFRAN ) 4 MG tablet Take 1 tablet (4 mg total) by mouth every 8 (eight)  hours as needed for up to 20 doses for nausea or vomiting. 20 tablet 0   phentermine 30 MG capsule Take 30 mg by mouth every morning.     topiramate (TOPAMAX) 25 MG tablet Take 25 mg by mouth 2 (two) times daily.     oxyCODONE  (ROXICODONE ) 5 MG immediate release tablet Take 1 tablet (5 mg total) by mouth every 6 (six) hours as needed for up to 20 doses for severe pain (pain score 7-10). (Patient not taking: Reported on 01/03/2024) 20 tablet 0   No current facility-administered medications for this visit.    Review of Systems  Constitutional:  Positive for fatigue. Negative for appetite change, chills and fever.  HENT:   Negative for hearing loss and voice change.   Eyes:  Negative for eye problems.  Respiratory:  Negative for chest tightness and cough.   Cardiovascular:  Positive for palpitations. Negative for chest pain.  Gastrointestinal:  Negative for abdominal distention, abdominal pain and blood in stool.  Endocrine: Negative for hot flashes.  Genitourinary:  Negative for difficulty urinating and frequency.   Musculoskeletal:  Negative for arthralgias.  Skin:  Negative for itching and rash.  Neurological:  Positive for light-headedness. Negative for extremity weakness.  Hematological:  Negative for adenopathy.  Psychiatric/Behavioral:  Positive for sleep disturbance. Negative for confusion.    PHYSICAL EXAMINATION: ECOG PERFORMANCE STATUS: 1 - Symptomatic but completely ambulatory Vitals:   01/03/24 1511  BP: 121/80  Pulse: 98  Resp: 17  Temp: 99 F (37.2 C)  SpO2: 99%   Filed Weights   01/03/24 1511  Weight: 172 lb (78 kg)    Physical Exam Constitutional:      General: She is not in acute distress. HENT:     Head: Normocephalic and atraumatic.  Eyes:     General: No scleral icterus. Cardiovascular:     Rate and Rhythm: Normal rate and regular rhythm.     Heart sounds: Normal heart sounds.  Pulmonary:     Effort: Pulmonary effort is normal. No respiratory  distress.     Breath sounds: No wheezing.  Abdominal:  General: Bowel sounds are normal. There is no distension.     Palpations: Abdomen is soft.  Musculoskeletal:        General: No deformity. Normal range of motion.     Cervical back: Normal range of motion and neck supple.  Skin:    General: Skin is warm and dry.     Findings: No erythema or rash.  Neurological:     Mental Status: She is alert and oriented to person, place, and time. Mental status is at baseline.     Cranial Nerves: No cranial nerve deficit.  Psychiatric:        Mood and Affect: Mood normal.     LABORATORY DATA:  I have reviewed the data as listed    Latest Ref Rng & Units 12/28/2023    9:38 AM 12/15/2023   10:38 AM 10/10/2023    3:32 PM  CBC  WBC 3.4 - 10.8 x10E3/uL 5.4  5.7  5.9   Hemoglobin 11.1 - 15.9 g/dL 16.1  9.6  09.6   Hematocrit 34.0 - 46.6 % 35.6  31.8  34.1   Platelets 150 - 450 x10E3/uL 328  285  324       Latest Ref Rng & Units 12/28/2023    9:38 AM 10/10/2023    3:32 PM 07/22/2022   10:29 AM  CMP  Glucose 70 - 99 mg/dL 98  78  93   BUN 6 - 24 mg/dL 14  15  13    Creatinine 0.57 - 1.00 mg/dL 0.45  4.09  8.11   Sodium 134 - 144 mmol/L 138  137  142   Potassium 3.5 - 5.2 mmol/L 4.2  3.4  3.8   Chloride 96 - 106 mmol/L 105  104  105   CO2 20 - 29 mmol/L 21  21  24    Calcium  8.7 - 10.2 mg/dL 8.9  9.1  8.8   Total Protein 6.0 - 8.5 g/dL  6.7  6.2   Total Bilirubin 0.0 - 1.2 mg/dL  0.4  0.3   Alkaline Phos 44 - 121 IU/L  84  76   AST 0 - 40 IU/L  30  20   ALT 0 - 32 IU/L  37  23       RADIOGRAPHIC STUDIES: I have personally reviewed the radiological images as listed and agreed with the findings in the report. MM 3D SCREENING MAMMOGRAM BILATERAL BREAST Result Date: 12/30/2023 CLINICAL DATA:  Screening. EXAM: DIGITAL SCREENING BILATERAL MAMMOGRAM WITH TOMOSYNTHESIS AND CAD TECHNIQUE: Bilateral screening digital craniocaudal and mediolateral oblique mammograms were obtained. Bilateral  screening digital breast tomosynthesis was performed. The images were evaluated with computer-aided detection. COMPARISON:  None available. ACR Breast Density Category b: There are scattered areas of fibroglandular density. FINDINGS: There are no findings suspicious for malignancy. IMPRESSION: No mammographic evidence of malignancy. A result letter of this screening mammogram will be mailed directly to the patient. RECOMMENDATION: Screening mammogram in one year. (Code:SM-B-01Y) BI-RADS CATEGORY  1: Negative. Electronically Signed   By: Roda Cirri M.D.   On: 12/30/2023 15:48   US  PELVIC COMPLETE WITH TRANSVAGINAL Result Date: 11/17/2023 CLINICAL DATA:  Menorrhagia for 3 years. EXAM: TRANSABDOMINAL AND TRANSVAGINAL ULTRASOUND OF PELVIS TECHNIQUE: Both transabdominal and transvaginal ultrasound examinations of the pelvis were performed. Transabdominal technique was performed for global imaging of the pelvis including uterus, ovaries, adnexal regions, and pelvic cul-de-sac. It was necessary to proceed with endovaginal exam following the transabdominal exam to visualize the endometrium and bilateral ovaries.  COMPARISON:  None Available. FINDINGS: Uterus Measurements: 8.4 x 5.2 x 5.8 cm = volume: 131.7 mL. No fibroids or other mass visualized. Tiny focal calcification in the myometrium. Endometrium Thickness: 9 mm.  No focal abnormality visualized. Right ovary Measurements: 3.6 x 2.2 x 2.2 cm = volume: 8.9 mL. Normal appearance/no adnexal mass. Left ovary Measurements: 2.9 x 1.8 x 2.1 cm = volume: 5.8 mL. Normal appearance/no adnexal mass. Other findings No abnormal free fluid.  Nabothian cysts are noted. IMPRESSION: No acute abnormality identified. Endometrium measures 9 mm. If bleeding remains unresponsive to hormonal or medical therapy, sonohysterogram should be considered for focal lesion work-up. (Ref: Radiological Reasoning: Algorithmic Workup of Abnormal Vaginal Bleeding with Endovaginal Sonography and  Sonohysterography. AJR 2008; 161:W96-04) Electronically Signed   By: Anna Barnes M.D.   On: 11/17/2023 12:20

## 2024-01-04 DIAGNOSIS — K912 Postsurgical malabsorption, not elsewhere classified: Secondary | ICD-10-CM | POA: Diagnosis not present

## 2024-01-04 DIAGNOSIS — N920 Excessive and frequent menstruation with regular cycle: Secondary | ICD-10-CM | POA: Diagnosis not present

## 2024-01-04 DIAGNOSIS — Z9884 Bariatric surgery status: Secondary | ICD-10-CM | POA: Diagnosis not present

## 2024-01-04 DIAGNOSIS — K219 Gastro-esophageal reflux disease without esophagitis: Secondary | ICD-10-CM | POA: Diagnosis not present

## 2024-01-04 DIAGNOSIS — E66811 Obesity, class 1: Secondary | ICD-10-CM | POA: Diagnosis not present

## 2024-01-04 DIAGNOSIS — Z79899 Other long term (current) drug therapy: Secondary | ICD-10-CM | POA: Diagnosis not present

## 2024-01-04 DIAGNOSIS — D509 Iron deficiency anemia, unspecified: Secondary | ICD-10-CM | POA: Diagnosis not present

## 2024-01-04 DIAGNOSIS — Z6830 Body mass index (BMI) 30.0-30.9, adult: Secondary | ICD-10-CM | POA: Diagnosis not present

## 2024-01-04 DIAGNOSIS — Z713 Dietary counseling and surveillance: Secondary | ICD-10-CM | POA: Diagnosis not present

## 2024-01-04 DIAGNOSIS — Z48815 Encounter for surgical aftercare following surgery on the digestive system: Secondary | ICD-10-CM | POA: Diagnosis not present

## 2024-01-05 ENCOUNTER — Inpatient Hospital Stay

## 2024-01-05 VITALS — BP 122/71 | HR 90 | Resp 16

## 2024-01-05 DIAGNOSIS — D500 Iron deficiency anemia secondary to blood loss (chronic): Secondary | ICD-10-CM

## 2024-01-05 DIAGNOSIS — Z833 Family history of diabetes mellitus: Secondary | ICD-10-CM | POA: Diagnosis not present

## 2024-01-05 DIAGNOSIS — R5383 Other fatigue: Secondary | ICD-10-CM | POA: Diagnosis not present

## 2024-01-05 DIAGNOSIS — R002 Palpitations: Secondary | ICD-10-CM | POA: Diagnosis not present

## 2024-01-05 DIAGNOSIS — I10 Essential (primary) hypertension: Secondary | ICD-10-CM | POA: Diagnosis not present

## 2024-01-05 DIAGNOSIS — R42 Dizziness and giddiness: Secondary | ICD-10-CM | POA: Diagnosis not present

## 2024-01-05 DIAGNOSIS — Z800 Family history of malignant neoplasm of digestive organs: Secondary | ICD-10-CM | POA: Diagnosis not present

## 2024-01-05 DIAGNOSIS — Z9884 Bariatric surgery status: Secondary | ICD-10-CM | POA: Diagnosis not present

## 2024-01-05 DIAGNOSIS — Z79899 Other long term (current) drug therapy: Secondary | ICD-10-CM | POA: Diagnosis not present

## 2024-01-05 DIAGNOSIS — Z8249 Family history of ischemic heart disease and other diseases of the circulatory system: Secondary | ICD-10-CM | POA: Diagnosis not present

## 2024-01-05 DIAGNOSIS — N920 Excessive and frequent menstruation with regular cycle: Secondary | ICD-10-CM | POA: Diagnosis not present

## 2024-01-05 DIAGNOSIS — E785 Hyperlipidemia, unspecified: Secondary | ICD-10-CM | POA: Diagnosis not present

## 2024-01-05 DIAGNOSIS — Z83438 Family history of other disorder of lipoprotein metabolism and other lipidemia: Secondary | ICD-10-CM | POA: Diagnosis not present

## 2024-01-05 DIAGNOSIS — Z8349 Family history of other endocrine, nutritional and metabolic diseases: Secondary | ICD-10-CM | POA: Diagnosis not present

## 2024-01-05 MED ORDER — IRON SUCROSE 20 MG/ML IV SOLN
200.0000 mg | Freq: Once | INTRAVENOUS | Status: AC
  Administered 2024-01-05: 200 mg via INTRAVENOUS
  Filled 2024-01-05: qty 10

## 2024-01-10 NOTE — Progress Notes (Unsigned)
    OBSTETRICS/GYNECOLOGY POST-OPERATIVE CLINIC VISIT  Subjective:   Abnormal uterine bleeding, menorrhagia   Bonnie Armstrong is a 43 y.o. female who presents to the clinic 3 weeks status post DILATATION & CURETTAGE/HYSTEROSCOPY WITH NOVASURE ABLATION - Choice  for Abnormal uterine bleeding, menorrhagia . Eating a regular diet {with-without:5700} difficulty. Bowel movements are {normal/abnormal***:19619}. {pain control:13522::"The patient is not having any pain."}  {Common ambulatory SmartLinks:19316}  Review of Systems {ros; complete:30496}   Objective:   LMP 12/12/2023 (Approximate)  There is no height or weight on file to calculate BMI.  General:  alert and no distress  Abdomen: soft, bowel sounds active, non-tender  Incision:   {incision:13716::"no dehiscence","incision well approximated","healing well","no drainage","no erythema","no hernia","no seroma","no swelling"}    Pathology:    Assessment:   Patient s/p *** (surgery)  {doing well:13525::"Doing well postoperatively."}   Plan:   1. Continue any current medications as instructed by provider. 2. Wound care discussed. 3. Operative findings again reviewed. Pathology report discussed. 4. Activity restrictions: {restrictions:13723} 5. Anticipated return to work: {work return:14002}. 6. Follow up: {1-61:09604} {time; units:18646} for ***    Rian Chafe, [facility]

## 2024-01-11 ENCOUNTER — Encounter: Admitting: Obstetrics

## 2024-01-11 ENCOUNTER — Encounter: Payer: Self-pay | Admitting: Obstetrics

## 2024-01-11 ENCOUNTER — Ambulatory Visit (INDEPENDENT_AMBULATORY_CARE_PROVIDER_SITE_OTHER): Admitting: Obstetrics

## 2024-01-11 VITALS — BP 117/65 | HR 76 | Ht 63.0 in | Wt 172.0 lb

## 2024-01-11 DIAGNOSIS — Z4889 Encounter for other specified surgical aftercare: Secondary | ICD-10-CM

## 2024-01-11 DIAGNOSIS — Z9889 Other specified postprocedural states: Secondary | ICD-10-CM | POA: Insufficient documentation

## 2024-01-11 DIAGNOSIS — N920 Excessive and frequent menstruation with regular cycle: Secondary | ICD-10-CM

## 2024-01-12 ENCOUNTER — Inpatient Hospital Stay

## 2024-01-12 VITALS — BP 137/86 | HR 87 | Temp 98.2°F | Resp 18

## 2024-01-12 DIAGNOSIS — D500 Iron deficiency anemia secondary to blood loss (chronic): Secondary | ICD-10-CM | POA: Diagnosis not present

## 2024-01-12 DIAGNOSIS — Z83438 Family history of other disorder of lipoprotein metabolism and other lipidemia: Secondary | ICD-10-CM | POA: Diagnosis not present

## 2024-01-12 DIAGNOSIS — R002 Palpitations: Secondary | ICD-10-CM | POA: Diagnosis not present

## 2024-01-12 DIAGNOSIS — R42 Dizziness and giddiness: Secondary | ICD-10-CM | POA: Diagnosis not present

## 2024-01-12 DIAGNOSIS — N920 Excessive and frequent menstruation with regular cycle: Secondary | ICD-10-CM | POA: Diagnosis not present

## 2024-01-12 DIAGNOSIS — E785 Hyperlipidemia, unspecified: Secondary | ICD-10-CM | POA: Diagnosis not present

## 2024-01-12 DIAGNOSIS — R5383 Other fatigue: Secondary | ICD-10-CM | POA: Diagnosis not present

## 2024-01-12 DIAGNOSIS — Z9884 Bariatric surgery status: Secondary | ICD-10-CM | POA: Diagnosis not present

## 2024-01-12 DIAGNOSIS — Z833 Family history of diabetes mellitus: Secondary | ICD-10-CM | POA: Diagnosis not present

## 2024-01-12 DIAGNOSIS — Z8349 Family history of other endocrine, nutritional and metabolic diseases: Secondary | ICD-10-CM | POA: Diagnosis not present

## 2024-01-12 DIAGNOSIS — Z8249 Family history of ischemic heart disease and other diseases of the circulatory system: Secondary | ICD-10-CM | POA: Diagnosis not present

## 2024-01-12 DIAGNOSIS — Z79899 Other long term (current) drug therapy: Secondary | ICD-10-CM | POA: Diagnosis not present

## 2024-01-12 DIAGNOSIS — Z800 Family history of malignant neoplasm of digestive organs: Secondary | ICD-10-CM | POA: Diagnosis not present

## 2024-01-12 DIAGNOSIS — I10 Essential (primary) hypertension: Secondary | ICD-10-CM | POA: Diagnosis not present

## 2024-01-12 MED ORDER — IRON SUCROSE 20 MG/ML IV SOLN
200.0000 mg | Freq: Once | INTRAVENOUS | Status: AC
  Administered 2024-01-12: 200 mg via INTRAVENOUS

## 2024-01-12 MED ORDER — SODIUM CHLORIDE 0.9% FLUSH
10.0000 mL | Freq: Once | INTRAVENOUS | Status: AC | PRN
  Administered 2024-01-12: 10 mL
  Filled 2024-01-12: qty 10

## 2024-01-16 DIAGNOSIS — Z411 Encounter for cosmetic surgery: Secondary | ICD-10-CM

## 2024-01-16 DIAGNOSIS — M793 Panniculitis, unspecified: Secondary | ICD-10-CM

## 2024-01-17 ENCOUNTER — Encounter: Payer: Self-pay | Admitting: Family Medicine

## 2024-01-19 ENCOUNTER — Inpatient Hospital Stay

## 2024-01-22 ENCOUNTER — Other Ambulatory Visit: Payer: Self-pay | Admitting: Obstetrics

## 2024-01-22 DIAGNOSIS — N920 Excessive and frequent menstruation with regular cycle: Secondary | ICD-10-CM

## 2024-01-22 DIAGNOSIS — N939 Abnormal uterine and vaginal bleeding, unspecified: Secondary | ICD-10-CM

## 2024-01-23 ENCOUNTER — Encounter: Payer: Self-pay | Admitting: Family Medicine

## 2024-01-24 ENCOUNTER — Ambulatory Visit (INDEPENDENT_AMBULATORY_CARE_PROVIDER_SITE_OTHER): Payer: 59 | Admitting: Student

## 2024-01-24 DIAGNOSIS — M793 Panniculitis, unspecified: Secondary | ICD-10-CM

## 2024-01-24 MED ORDER — CEPHALEXIN 500 MG PO CAPS
500.0000 mg | ORAL_CAPSULE | Freq: Four times a day (QID) | ORAL | 0 refills | Status: AC
Start: 2024-01-24 — End: 2024-01-29

## 2024-01-24 NOTE — Progress Notes (Deleted)
 Patient is a pleasant 43 year old female s/p abdominoplasty and mons plasty performed 01/18/2024 by Dr. Orin Birk who presents to clinic for postoperative follow-up.

## 2024-01-24 NOTE — Progress Notes (Signed)
 Patient is a 43 year old female who underwent panniculectomy with upper abdominal add-on and mons plasty with Dr. Orin Birk on 01/16/2024.  She is a little over 1 week postop.  She presents to the clinic today for postoperative follow-up.  Today, patient is accompanied by her mother at bedside.  She reports that she is overall doing pretty well.  She does state that she is experiencing some leaking around her left drain site.  She states that the bulb is staying suctioned, but when she gets into certain positions she notices that it leaks a significant amount of fluid.  She otherwise reports that her pain has been controlled.  She denies any fevers or chills.  She reports that her right drain has been putting out about 50 cc/day and the left drain has been putting out a little bit more, 60-80.  Chaperone present on exam.  On exam, patient is sitting upright in no acute distress.  Abdomen is soft and nontender.  There is no overlying erythema.  No obvious fluid collections palpated on exam.  Umbilicus appears to be viable.  There is some mild irritation around the umbilical incision.  Incision does appear to be intact though.  There is no drainage noted on exam.  Mepilex border dressings to the inferior abdominal incision appear to be clean dry and intact.  These were removed.  Steri-Strips are in place over the incision appear to be clean dry and intact.  There is no surrounding erythema.  Right drain is in place and functioning with serous drainage in the bulb.  Left drain is in place and functioning with serous drainage in the bulb.  There is some irritation/redness around the left drain site.  It is mildly tender to palpation.  Mepilex border dressings were replaced over the inferior abdominal incision and around the drain sites, as well as over the umbilicus.  The drain on exam today appears to be functioning properly.  There is no significant wound around the left drain site.  I recommended that patient  cover this area and make sure she is changing it when it becomes saturated.  Recommended gentle dressings such as a Mepilex border dressing/silicone border dressing.  Discussed with her the importance of making sure that the bulb is always compressed to make sure the drain is functioning.  Patient expressed understanding.  I did discuss with the patient that her drain site does appear to be irritated.  We discussed her closely monitoring it versus possibly starting antibiotics if this is the start of an early infection.  Medical decision making was made with the patient and we decided that I will go ahead and send in some antibiotics, but patient will continue to monitor and go ahead and pick them up if she feels like things are getting worse.  I also recommended that the patient let us  know if she feels like things are getting worse.  She expressed understanding.  Recommended that patient apply Vaseline and a dressing to her umbilicus daily.  Discussed with her to continue compression at all times and avoid strenuous activities.  We will have the patient follow back up next week.  I instructed her to call in the meantime if she has any questions or concerns about anything.

## 2024-01-25 NOTE — Telephone Encounter (Signed)
 I called and spoke with the patient. I answered all of her questions to her satisfaction. Discussed with patient that if she continues to have issues with the drain we may consider putting another stitch around the drain. She expressed understanding and states she will let us  know by Friday morning if drainage leaking around the drain site does not slow down

## 2024-01-26 ENCOUNTER — Inpatient Hospital Stay: Attending: Oncology

## 2024-01-26 ENCOUNTER — Telehealth: Payer: Self-pay | Admitting: Student

## 2024-01-26 VITALS — BP 142/86

## 2024-01-26 DIAGNOSIS — D500 Iron deficiency anemia secondary to blood loss (chronic): Secondary | ICD-10-CM | POA: Diagnosis not present

## 2024-01-26 DIAGNOSIS — Z79899 Other long term (current) drug therapy: Secondary | ICD-10-CM | POA: Diagnosis not present

## 2024-01-26 DIAGNOSIS — N920 Excessive and frequent menstruation with regular cycle: Secondary | ICD-10-CM | POA: Diagnosis not present

## 2024-01-26 MED ORDER — IRON SUCROSE 20 MG/ML IV SOLN
200.0000 mg | Freq: Once | INTRAVENOUS | Status: AC
  Administered 2024-01-26: 200 mg via INTRAVENOUS

## 2024-01-26 NOTE — Telephone Encounter (Signed)
 Patient is a 43 year old female who recently underwent abdominoplasty about a week and a half ago.  She called the clinic today with concerns about her incision.  I called the patient.  She states that she has had continued itching throughout the day.  She states that she removed some of her dressing and noticed redness around the dressing and continued itching.  She reports that her drain output on the right side has been 40 cc and her drain output on the left side was 120 cc.  She denies any shortness of breath, difficulty breathing.  She does not report any pain.  I discussed with the patient that I believe this is either a reaction to some of the dressings will Dermabond versus possibly fluid in the surgical space and the drains are not able to keep up.  I recommended that she take off her dressings and if she feels comfortable with regards to Steri-Strips as well.  I recommended that she apply a small amount of Vaseline to the incision.  I recommended that she continue taking Benadryl  at night and Zyrtec or Allegra during the day.  I also discussed with the patient I like her to wear compression at all times.  Patient expressed understanding.   I would like the patient to follow back up in the clinic tomorrow morning.  I discussed with her that if she develops any difficulty breathing, shortness of breath or sensation of her throat closing she needs to go to the emergency room. Patient expressed understanding

## 2024-01-26 NOTE — Progress Notes (Signed)
 Patient is s/p abdominoplasty 10 days ago with Dr. Orin Birk.  She called into on-call service re: progressive itching, redness, and localized swelling along her incision. She was seen in the office two days ago and it's suspected allergic dermatitis. She has a visit scheduled for tomorrow morning, but wanted to know what else can be done.  She is already taking antihistamines 2-3 times daily. She is applying a liberal amount of Vaseline to the incision, but the skin glue remains largely intact.   Discussed with patient that localized allergic reactions post operatively are not uncommon and can be attributed to a large number of possible etiologies such as the prep used, skin glue, steri strips, or other adhesive products or bandages. Unfortunately, there is not much that can be done beyond what she is already doing.  The petroleum jelly will help the residual dermabond breakdown and the antihistamines should help with the itching. Do not recommend systemic steroids postoperatively given that it impairs wound healing. She denies any other systemic symptoms such as systemic hives, throat closing sensation, facial or tongue swelling, cough or wheezing, fevers, or other concerns.  While she feels as though fluid is about to break through her incision, she describes yellow serous character to the output in her bilateral drains which remain in place and functional. Non concerning for hematoma. She does have moderate to high output and drains likely will not be removed for at least another week.  Plan for in person evaluation tomorrow. She understands that if she develops any new or worsening symptoms to call back and/or go to the emergency room for evaluation.

## 2024-01-26 NOTE — Patient Instructions (Signed)

## 2024-01-27 ENCOUNTER — Ambulatory Visit: Admitting: Student

## 2024-01-27 DIAGNOSIS — M793 Panniculitis, unspecified: Secondary | ICD-10-CM

## 2024-01-27 MED ORDER — PREDNISONE 50 MG PO TABS
50.0000 mg | ORAL_TABLET | Freq: Every day | ORAL | 0 refills | Status: DC
Start: 2024-01-27 — End: 2024-03-12

## 2024-01-27 NOTE — Progress Notes (Signed)
 Patient is a 43 year old female who underwent panniculectomy with upper abdominal add-on and mons plasty with Dr. Orin Birk on 01/16/2024.  She is about 11 days postop.  She presents to the clinic today with concerns about a rash to her incision.  Patient was last seen in the clinic on 01/24/2024.  At this visit, patient was experiencing leakage around her drain site although drains were still functioning.  On exam, abdomen was soft and nontender.  Umbilicus was viable.  Steri-Strips were in place over the incision which appeared to be clean dry and intact.  Right drain was in place and functioning with serous drainage in the bulb.  Left drain was in place and functioning with serous drainage in the bulb, there was some irritation around the left drain site.  Today, patient presents with her mother and husband at bedside.  She states that she is okay.  She states that she has been taking Benadryl , but she still notices rash around her incision.  She states she has been applying Vaseline.  She does state that that her rash does appear a little bit better today.  She denies any fevers or chills.  She states today that her left drain has put out about 160 cc since yesterday and her right drain has put out about 40.  She denies any leakage around the left drain site.  She states that she is not experiencing pain.  She does state that she has some numbness to her skin.  She denies any other issues or concerns at this time.  Chaperone present on exam.  On exam, abdomen is soft and nontender.  There is no overlying erythema to the abdomen itself.  There is some mild ecchymosis noted.  No obvious fluid collections or fluid waves palpated or noted on exam.  Umbilicus appears to be viable.  Irritation surrounding the umbilicus appears to be improved.  Umbilical incision appears to be intact and healing well.  Inferior abdominal incision appears to be overall intact.  There is a little bit of scabbing noted centrally to  the incision.  There is a surrounding rash to the entirety of the incision.  It appears to be consistent with a reaction.  There is no active drainage on exam.  No significant tenderness to palpation.  JP drains are in place and functioning bilaterally with serous drainage in each of the bulbs.  I discussed with the patient that it appears she is having a reaction to possibly the Dermabond or Steri-Strips.  I discussed with the patient that I would like her to avoid all adhesives at this time.  I discussed with her that I would like her to apply Vaseline to her incision.  I also recommended that we begin an oral steroid given her symptoms.  I discussed with her that this could inhibit wound healing. She expressed understanding.  I discussed with the patient that I would otherwise like her to wear compression at all times.  I discussed with her to closely monitor her symptoms and her surgical site.  I instructed her that she will need to go to the emergency room if she develops any shortness of breath, difficulty breathing or sensation that her throat is closing.  I discussed with her to monitor her surgical site and if she develops any worsening redness, swelling, pain, she needs to let us  know.  Patient expressed understanding.  I would like the patient to follow back up in the clinic on Monday.  I instructed her  to call in the meantime if she has any questions or concerns about anything.  Pictures were obtained of the patient and placed in the chart with the patient's or guardian's permission.

## 2024-01-30 ENCOUNTER — Encounter: Payer: Self-pay | Admitting: Oncology

## 2024-01-30 ENCOUNTER — Ambulatory Visit (INDEPENDENT_AMBULATORY_CARE_PROVIDER_SITE_OTHER): Admitting: Student

## 2024-01-30 DIAGNOSIS — M793 Panniculitis, unspecified: Secondary | ICD-10-CM

## 2024-01-30 NOTE — Progress Notes (Signed)
 Patient is a 43 year old female who underwent panniculectomy with upper abdominal add-on and mons plasty with Dr. Orin Birk on 01/16/2024.  Patient is 2 weeks postop.  She presents to the clinic today for further postoperative follow-up.     Patient was last seen in the clinic on 01/27/2024.  At this visit, patient was doing okay.  On exam, umbilicus appear to be viable.  The inferior abdominal incision overall appeared to be intact, there was a little bit of scabbing noted centrally to the incision.  There was a surrounding rash to the entirety of the incision that appear to be consistent with her reaction.  It was recommended that she apply Vaseline to her incision and begin oral steroid given her symptoms.  Today,

## 2024-01-31 ENCOUNTER — Encounter: Admitting: Student

## 2024-02-02 ENCOUNTER — Inpatient Hospital Stay

## 2024-02-03 ENCOUNTER — Ambulatory Visit: Admitting: Student

## 2024-02-03 ENCOUNTER — Encounter: Payer: Self-pay | Admitting: Student

## 2024-02-03 VITALS — BP 118/79 | HR 100

## 2024-02-03 DIAGNOSIS — M793 Panniculitis, unspecified: Secondary | ICD-10-CM

## 2024-02-03 NOTE — Progress Notes (Signed)
 Patient is a 43 year old female who underwent panniculectomy with upper abdominal add-on and mons plasty with Dr. Orin Birk on 01/16/2024.  Patient is almost 3 weeks postop.  She presents to the clinic today for postoperative follow-up.         Patient was last seen in the clinic on 01/30/2024.  At this visit, patient was doing okay.  She reported that her rash was much improved.  Abdomen was soft and nontender on exam.  Umbilicus was viable.  There was a little bit of separation noted centrally with some exudate throughout the separation.  JP drains were in place and functioning bilaterally with serous drainage in the bulbs.  It was recommended that patient continue Vaseline on her incisions.  Today, patient reports she is doing okay.  She states that since we spoke this morning on the phone, she showered and just noticed some irritation/itching around where the sutures were.  She is requesting that sutures to be removed here in clinic today.  She denies any other issues or concerns since we spoke on the phone this morning.  Chaperone present on exam.  On exam, patient is sitting upright in no acute distress.  Abdomen is soft and nontender.  There are no obvious fluid collections on exam.  Umbilicus appears to be viable.  Incision overall appears to be intact around the umbilicus, there is a small area of separation noted at the 1:00 to 2 o'clock position with some exudate noted throughout.  To the inferior abdominal incision, there was several Monocryl sutures that were noted.  These were cut and removed.  Patient tolerated well.  There is a small area of separation noted centrally, with exudate throughout.  This is somewhat improved from previous exam.  There are no signs of infection on exam.  JP drains are in place and functioning bilaterally with serous drainage in the bulbs.  Recommended that patient continue to apply Vaseline as instructed to her incision daily.  Discussed with her that she can take  an antihistamine during the day and Benadryl  at night, and may use Benadryl  cream or steroid cream on her skin but not her incision.  Patient expressed understanding.  Patient to continue with compression.  Recommended that patient follow back up on Tuesday at her next scheduled appointment.  I instructed her to call in the meantime though if she has any questions or concerns about anything.

## 2024-02-03 NOTE — Telephone Encounter (Signed)
 I called and spoke with the patient in regards to her MyChart message.  She states that overall she is doing well.  She states that since she stopped the steroids, she feels like she has been itching a little bit.  She denies any fevers or chills.  She reports that her drain output has been slowly coming down.  I reviewed the photos that the patient attached.  I discussed with her that based off of the photos, it appears that she is still little bit irritated, but improved.  I discussed with her that this is expected given rash she had before.  She does have an area of mild separation with exudate throughout, similar/slightly improved to the previous exam when I saw her earlier this week.  There is an area of redness just medial to this area, near a suture that appears to be consistent with irritation based off the photo.  I recommended that patient closely monitor her incision and that area just medial to where the slight separation is.  I discussed with her that if the redness increases or more painful she needs to call us  right away.  Patient expressed understanding.  I recommended that in the meantime, patient take antihistamines during the day and Benadryl  at night to help with the itching.  I discussed with her that she can use Benadryl  cream or hydrocortisone cream on her skin, but not on the incision.  Discussed with patient would like her to continue Vaseline to her incision.  Patient expressed understanding.  We will plan to keep her appointment on Tuesday.  I did offer her an appointment today, but she states that she is comfortable waiting until Tuesday.  I instructed her to call in the meantime if she has any questions or concerns about anything.

## 2024-02-06 ENCOUNTER — Ambulatory Visit (INDEPENDENT_AMBULATORY_CARE_PROVIDER_SITE_OTHER): Admitting: Student

## 2024-02-06 DIAGNOSIS — M793 Panniculitis, unspecified: Secondary | ICD-10-CM

## 2024-02-06 NOTE — Telephone Encounter (Signed)
 I attempted to call this patient, LVM to return call. I am more than happy to see her today if she calls back

## 2024-02-06 NOTE — Progress Notes (Signed)
 Patient is a 42 year old female who underwent panniculectomy with upper abdominal add-on and mons plasty with Dr. Lowery on 01/16/2024.  Patient is 3 weeks postop.  She presents to the clinic today for postoperative follow-up.  Patient was last seen in the clinic on 02/03/2024.  At this visit, patient was doing okay.  On exam, abdomen was soft and nontender.  There were no obvious fluid collections on exam.  Umbilicus was viable.  There was a little bit of separation to the umbilicus at the 1:00 to 2 o'clock position with some exudate noted.  Several sutures were cut and removed.  There was a small area of separation noted centrally to the inferior abdominal incision.  JP drains were in place with serous drainage in each of the bulbs.  Today, patient presents with her mother at bedside.  Patient reports she is doing well.  She states that yesterday, she noticed there is a small opening around her umbilicus and noticed some drainage from the area as well.  She denies any fevers or chills.  She denies any other issues or concerns at this time.  She reports that her right drain has been putting out about 15 to 20 cc/day for the past few days.  She states that her left drain has been putting out about 90 or so cc per day for the past few days.  Chaperone present on exam.  On exam, patient is sitting upright in no acute distress.  Abdomen is soft and nontender.  There is no overlying erythema.  No obvious fluid collection on exam.  Umbilicus appears to be viable.  There is a small wound approximately 1 cm in size.  There is no active drainage on exam.  It appears fairly superficial.  Remainder of the umbilical incision appears to be intact and healing well.  Inferior abdominal incision appears to be overall healing well.  There is a little bit of slough noted centrally, but improved from previous exam.  Rash/irritation around the incision also appears to have significantly improved.  JP drains are in place and  functioning bilaterally with minimal serous drainage in the bulbs.  There are no signs of infection on exam.  I recommended that patient apply calcium  alginate over her umbilical wound daily.  Recommended that she apply a small film of Vaseline over the incision once daily as well as the inferior abdominal incision.  Patient expressed understanding.  Discussed with patient the importance of wearing compression at all times.  I discussed with the patient that if her drain output continues as it has been, we can plan to hopefully take her right drain out later this week.  Patient expressed understanding.  We will have the patient come back on Thursday or Friday for reevaluation and possible drain removal.  I instructed the patient to call in the meantime if she has any questions or concerns about anything.

## 2024-02-07 ENCOUNTER — Encounter: Payer: 59 | Admitting: Student

## 2024-02-09 ENCOUNTER — Encounter: Payer: Self-pay | Admitting: Student

## 2024-02-09 ENCOUNTER — Ambulatory Visit: Admitting: Student

## 2024-02-09 VITALS — BP 116/84 | HR 113 | Ht 63.0 in | Wt 165.0 lb

## 2024-02-09 DIAGNOSIS — M793 Panniculitis, unspecified: Secondary | ICD-10-CM

## 2024-02-09 NOTE — Progress Notes (Signed)
 Patient is a 43 year old female who underwent panniculectomy with upper abdominal add-on and mons plasty with Dr. Lowery on 01/16/2024.  She is about 3 and half weeks postop.  She presents to the clinic today for postoperative follow-up.  Patient was last seen in the clinic on 02/06/2024.  At this visit, patient was doing well.  On exam, there were no obvious fluid collections on exam.  Umbilicus appear to be viable.  There was a small wound around  to the umbilical incision.  Inferior abdominal incision was overall intact and healing well.  There was a little bit of slough noted centrally.  JP drains were in place and functioning bilaterally.  Today, patient reports she is overall doing well.  She states that she cut the stitch to her right drain for comfort.  She states that the drain has remained in place and has been functioning, and the output has been less than 30 cc for several days.  She states she also accidentally cut the suture to her left drain.  She states that the drain has been in place and functioning, but the output has been about 100 cc/day.  She denies any fevers or chills.  She denies any other issues or concerns at this time.  Chaperone present on exam.  On exam, patient is sitting upright in no acute distress.  Abdomen is soft and nontender.  There is no overlying erythema.  No obvious fluid collections palpated on exam.  Umbilicus is healthy.  There still is a small wound to the umbilical incision, but it does appear to be slightly improved from previous exam.  There is some exudate noted within the wound.  No active drainage noted on exam.  Inferior abdominal incision appears to be overall intact and healing well.  There still is some superficial scabbing, but improved from previous exams.  There is no surrounding erythema.  JP drains are in place and functioning bilaterally.  There is minimal serous drainage in each of the bulbs.  There are no signs of infection on exam.  Right JP  drain was removed without any difficulty.  Patient tolerated well.  As for the left JP drain, I discussed with the patient that she can try to keep it secured in place with tape and dressings or we can put another stitch in it today.  I discussed with her though that she may be at higher risk of accidentally taking the drain out and we discussed the consequences of premature drain removal.  Medical decision making was made with the patient and decision was made to place another stitch to the left drain here in clinic today.  Sterile gloves were put on.  The left JP drain site was prepped with Betadine.  1 cc of a mixture of 1% lidocaine  with epinephrine and marcaine were injected near the left JP drain site.  A 4-0 silk was used to secure the drain in place.  The patient tolerated well.  I discussed with the patient the importance of wearing her compression at all times and avoiding strenuous activities.  I recommended that she continue with a calcium  alginate to the wound near her umbilicus and continue with Vaseline to the remainder of her incisions.  Patient expressed understanding.  We will plan to see the patient back next week.  I instructed her to call if she has any questions or concerns about anything.

## 2024-02-10 ENCOUNTER — Other Ambulatory Visit: Payer: Self-pay

## 2024-02-10 ENCOUNTER — Ambulatory Visit (INDEPENDENT_AMBULATORY_CARE_PROVIDER_SITE_OTHER): Admitting: Student

## 2024-02-10 ENCOUNTER — Encounter: Payer: Self-pay | Admitting: Student

## 2024-02-10 VITALS — BP 128/88 | HR 107 | Temp 98.4°F | Ht 63.0 in | Wt 165.0 lb

## 2024-02-10 DIAGNOSIS — M793 Panniculitis, unspecified: Secondary | ICD-10-CM

## 2024-02-10 MED ORDER — DOXYCYCLINE HYCLATE 100 MG PO TABS: 100.0000 mg | ORAL_TABLET | Freq: Two times a day (BID) | ORAL | 0 refills | Status: AC

## 2024-02-10 NOTE — Progress Notes (Signed)
 Patient is a 43 year old female who underwent panniculectomy with upper abdominal add-on and mons plasty with Dr. Lowery on 01/16/2024. She is almost 4 weeks postop. She presents to the clinic today for further postoperative follow up.      Patient was seen in the clinic yesterday.  At this visit, she was doing well.  Her right drain was removed at this visit.  Her abdomen was soft and nontender and there were no fluid collections on exam.  There was a small wound to the umbilical incision.  Overall the inferior abdominal incision was healing well.  A stitch was placed to the left drain.  Today, patient reports that she is doing okay.  She presents with her husband at bedside.  She states that last night she had a fever, but has not had a fever since.  She reports that she was having some flulike symptoms, but is feeling much better today.  Patient also reports she feels like she is a little bit more swollen on the right side of her abdomen.  She denies any drainage out from the previous drain site.  Patient reports her drain output from her left drain has been about 20 cc since last night.  She denies any nausea or vomiting.  She denies any other issues or concerns at this time.  She denies any chest pain or shortness of breath.  Vitals:   02/10/24 1018 02/10/24 1101  BP: 128/88   Pulse: (!) 117 (!) 107  Temp: 98.4 F (36.9 C)   SpO2: 100%   Patient is afebrile, vitals are stable.  Her heart rate is slightly elevated.  Chaperone present on exam.  On exam, patient is sitting upright in no acute distress.  Abdomen is soft.  There is some very mild tenderness to palpation to the right abdomen.  There is no overlying erythema.  There is a little bit of swelling noted to the right side, there is no significant fluid collection palpated on exam.  There might be a small amount of fluid palpated.  There is no fluid wave noted on exam.  Umbilicus appears to be healthy.  Wound to the umbilical incision  appears to be improved with a little bit of exudate noted throughout.  Inferior abdominal incision appears to overall be healing well with some scattered superficial scabbing noted throughout.  Left JP drain is in place and functioning.  The black dot is not visualized.  The bulb remains charged.    I discussed with the patient that she does have a little bit of swelling noted to the right side, but no obvious or distinct fluid collection.  I recommended that she gently massage going laterally to medially to help facilitate drainage out of her left drain.  I also discussed with the patient the importance of wearing her compression at all times. Patient expressed understanding.  Given patient's symptoms, I recommended that we cover her with an antibiotic.  Will go ahead and send in doxycycline for 7 days.  Recommended that patient start taking this today.  She expressed understanding.  I discussed with the patient that she should closely monitor her surgical site.  If she notices any increased swelling, pain, fevers, chills or feels like any of her symptoms are worsening she needs to call us  immediately.  Patient expressed understanding.  I discussed with the patient the importance of staying hydrated.  I discussed with her that she can drink liquid IV or Gatorade as well as water to help  her stay hydrated.  She expressed understanding.  I recommended that patient just apply Vaseline to her umbilical incision and wound as well as her inferior abdominal incision.  She expressed understanding.  I would like the patient to follow back up on Monday.  Pictures were obtained of the patient and placed in the chart with the patient's or guardian's permission.

## 2024-02-13 ENCOUNTER — Ambulatory Visit (INDEPENDENT_AMBULATORY_CARE_PROVIDER_SITE_OTHER): Admitting: Student

## 2024-02-13 VITALS — BP 149/92 | HR 108

## 2024-02-13 DIAGNOSIS — M793 Panniculitis, unspecified: Secondary | ICD-10-CM

## 2024-02-13 NOTE — Progress Notes (Signed)
 Patient is a 43 year old female who underwent panniculectomy with upper abdominal add-on and mons plasty with Dr. Lowery on 01/16/2024. She is 4 weeks postop. She presents to the clinic today for further postoperative follow up.   Patient was last seen in the clinic on 02/10/2024.  At this visit, she reported that she had flulike symptoms and had a fever the night prior.  On exam, abdomen was soft.  There was a little bit of swelling noted to the right side, no significant fluid collection palpated on exam.  Umbilicus appeared to be healthy.  Inferior abdominal incision appeared to be overall intact and healing well.  Left JP drain was in place and functioning.  Antibiotic was started given patient's symptoms.  Today, patient reports she is doing okay.  She presents with her mother at bedside.  She states that over the weekend, she had some low-grade fevers.  She states that she was having flulike symptoms on Saturday, but they have been improving.  She denies any fevers since Saturday.  She denies any nausea or vomiting.  She states that she feels like the right side is swollen and somewhat tender to palpation.  She states that she feels like she has a fluid collection.  Patient states on the left side, output has drastically decreased.  Chaperone present on exam.  On exam, patient is sitting upright in no acute distress.  Abdomen is overall soft.  There is some swelling noted on the right side, little bit more than previous exam.  There is no overlying erythema.  There does appear to be a little bit of a fluid collection palpated on exam.  Incision is overall intact and healing well.  There are some superficial scabs throughout the incision.  There is no surrounding erythema.  Left JP drain is intact and functioning with serous drainage in the bulb.  Dr. Lowery also had the opportunity to evaluate the patient.  She aspirated the right abdomen and was able to get about 40 cc of clear serous drainage  out.  The patient tolerated well.  Discussed with the patient that for now, would like her to continue with her compression at all times.  Discussed with her to continue with her antibiotic and make sure that she is hydrating plenty.  I recommended also that she continue to try to massage towards the left drain to facilitate drainage.  She expressed understanding.  I did discuss with the patient that if her symptoms persist, we may have to consider having her go to radiology for drain placement versus further aspiration.  Patient expressed understanding.  We will plan to see the patient back later this week.  I instructed her to call in the meantime though if she has any questions or concerns about anything.  I discussed with her that she should call us  if she feels like her symptoms are worsening.  Patient expressed understanding.  Pictures were obtained of the patient and placed in the chart with the patient's or guardian's permission.

## 2024-02-16 ENCOUNTER — Telehealth: Payer: Self-pay

## 2024-02-16 ENCOUNTER — Ambulatory Visit (INDEPENDENT_AMBULATORY_CARE_PROVIDER_SITE_OTHER): Admitting: Student

## 2024-02-16 ENCOUNTER — Encounter: Payer: Self-pay | Admitting: Student

## 2024-02-16 VITALS — BP 131/85 | HR 99

## 2024-02-16 DIAGNOSIS — M793 Panniculitis, unspecified: Secondary | ICD-10-CM

## 2024-02-16 MED ORDER — DOXYCYCLINE HYCLATE 100 MG PO TABS: 100.0000 mg | ORAL_TABLET | Freq: Two times a day (BID) | ORAL | 0 refills | Status: AC

## 2024-02-16 NOTE — Progress Notes (Signed)
 Patient is a 43 year old female who underwent panniculectomy with upper abdominal add-on and mons plasty with Dr. Lowery on 01/16/2024. She is almost 5 weeks postop. She presents to the clinic today for further postoperative follow up.   Patient was last seen in the clinic on 02/13/2024.  At this visit, patient was doing okay.  She did report at this visit she was having some flulike symptoms.  On exam, abdomen was overall soft.  There was some swelling noted on the right side.  There is no overlying erythema.  Incision was overall intact and healing well.  There were some superficial scabs noted throughout the incision.  Left JP drain was intact.  40 cc of clear serous drainage was aspirated from the abdomen.  Today, patient presents with her mother at bedside.  She overall states that she is doing well.  She states that she feels like she still has some fluid on the right side and feels that it is mildly swollen/tender.  She denies any redness.  She does not report any fevers or chills since last weekend.  She reports that her other drain has been putting out about 40 cc/day over the last few days.  Chaperone present on exam.  On exam, patient is sitting upright in no acute distress.  Abdomen is overall soft and nontender.  There is a little bit of firmness noted on the right abdomen consistent with a little bit of scar tissue.  There is also what appears to be a small fluid collection on the right side as well.  There are no overlying skin changes and there is no erythema.  Umbilicus appears to be viable, small wound does appear to be improving.  Incision to the inferior abdomen overall appears to be healing well, though there are still some very superficial wounds noted throughout.  Left JP drain is in place and functioning with serous drainage in the bulb.  There is some mild irritation around the drain site.  There are no signs of infection on exam.  Given patient's symptoms and her exam, we discussed  ultrasound of the abdomen with aspiration.  Patient states that she would like to move forward with this.  Will go ahead and get this ordered for her.  Discussed with her that in the meantime she should continue with her compression at all times and continue to monitor her surgical site.  Patient expressed understanding.  Recommended that patient continue with Vaseline to her incisions and around her drain site.  Discussed with the patient that she may finish out her current course of antibiotics if she would like.  I will go ahead and send in another week of doxycycline.  Discussed with her that if she feels she needs it, she can go ahead and pick it up and start it.  Patient expressed understanding.  We will plan to see the patient back next week.  I instructed her to call in the meantime if she has any questions or concerns about anything.

## 2024-02-16 NOTE — Telephone Encounter (Signed)
 I attempted to call the patient but there was no answer. I left a message for her to call us  back. I wanted to let her know that we are working on getting her scheduled for her ultrasound. In the meantime she should continue to wear her compression. Patient has an appointment with Estef[facility] on Monday 7/7.

## 2024-02-20 ENCOUNTER — Encounter: Admitting: Student

## 2024-02-20 NOTE — Progress Notes (Deleted)
 Patient is a 43 year old female who underwent panniculectomy with upper abdominal add-on and mons plasty with Dr. Lowery on 01/16/2024.  Patient is 5 weeks postop.  She presents to the clinic today for postoperative follow-up.     Patient was last seen in the clinic on 03/14/2024.  At this visit, patient was overall doing well.  She reported that her left drain was still putting out about 40 cc/day.  On exam, abdomen was overall soft and nontender.  There was a little bit of scar tissue and maybe a small fluid collection on the right side.  Umbilicus appear to be healthy.  Incision to the inferior abdominal incision was healing well.  Left JP drain was functioning with serous drainage throughout.  An ultrasound with possible aspiration was ordered for the patient.  Today,

## 2024-02-21 ENCOUNTER — Ambulatory Visit
Admission: RE | Admit: 2024-02-21 | Discharge: 2024-02-21 | Disposition: A | Discharge status: Home / Self Care | Source: Ambulatory Visit | Attending: Student | Admitting: Student

## 2024-02-21 ENCOUNTER — Other Ambulatory Visit: Payer: Self-pay | Admitting: Surgical

## 2024-02-21 ENCOUNTER — Ambulatory Visit (INDEPENDENT_AMBULATORY_CARE_PROVIDER_SITE_OTHER): Admitting: Plastic Surgery

## 2024-02-21 ENCOUNTER — Encounter: Payer: Self-pay | Admitting: Plastic Surgery

## 2024-02-21 VITALS — BP 143/92 | HR 74

## 2024-02-21 DIAGNOSIS — T888XXD Other specified complications of surgical and medical care, not elsewhere classified, subsequent encounter: Secondary | ICD-10-CM

## 2024-02-21 DIAGNOSIS — M793 Panniculitis, unspecified: Secondary | ICD-10-CM | POA: Diagnosis not present

## 2024-02-21 DIAGNOSIS — R188 Other ascites: Secondary | ICD-10-CM | POA: Diagnosis not present

## 2024-02-21 MED ORDER — FLUCONAZOLE 150 MG PO TABS: 150.0000 mg | ORAL_TABLET | Freq: Once | ORAL | 0 refills | Status: AC

## 2024-02-21 NOTE — Progress Notes (Signed)
 [name], MD  [name] PROCEDURE / BIOPSY REVIEW Date: 02/21/24  Requested Biopsy site: anterior abdominal wall Reason for request: 2 post op fluid collections need aspiration/culture Imaging review: Best seen on U/S  Decision: Approved Imaging modality to perform: Ultrasound Schedule with: No sedation / Local anesthetic Schedule for: Any VIR  Additional comments: @Schedulers .  Please contact me with questions, concerns, or if issue pertaining to this request arise.  [name] [name], MD [facility]       Previous Messages    ----- Message ----- From: Marijo Quizon Sent: 02/21/2024  11:11 AM EDT To: Iyari Hagner; Ir Procedure Requests Subject: US  IMAGE GUIDED FLUID DRAIN BY CATHETER        Procedure : US  IMAGE GUIDED FLUID DRAIN BY CATHETER  Reason: 1.6 x 4.1x6.5 cm fluid collection RLQ abdomen s/p panniculectomy/abdominoplasty and 0.8 x 1 x 2.1 cm fluid collection anterior abdominal wall. Dx: Fluid collection at surgical site, subsequent encounter [T88.8XXD (ICD-10-CM)]; Panniculitis [M79.3 (ICD-10-CM)]    History : US  abd limited  Provider : Lenis Donnice PARAS, PA-C Contact : [phone]

## 2024-02-21 NOTE — Progress Notes (Signed)
   Subjective:    Patient ID: Bonnie Armstrong, female    DOB: September 30, 1980, 43 y.o.   MRN: [id number]  The patient is a 43 yrs old female here with mom for evaluation of her abdomen.  She underwent a panniculectomy and abdominoplasty in early June.  She has the left drain in place.  It is draining ~30 cc / day.  She has a seroma on the right side.  We god an ultrasound but they were not able to place a drain.  I attempted to drain it but was not able to find the pocket.       Review of Systems  Constitutional:  Positive for activity change.  Eyes: Negative.   Respiratory: Negative.    Cardiovascular: Negative.   Gastrointestinal: Negative.   Endocrine: Negative.   Genitourinary: Negative.   Musculoskeletal: Negative.        Objective:   Physical Exam HENT:     Head: Atraumatic.  Cardiovascular:     Rate and Rhythm: Normal rate.     Pulses: Normal pulses.  Skin:    General: Skin is warm.     Capillary Refill: Capillary refill takes less than 2 seconds.  Neurological:     Mental Status: She is alert and oriented to person, place, and time.  Psychiatric:        Mood and Affect: Mood normal.        Behavior: Behavior normal.        Thought Content: Thought content normal.        Judgment: Judgment normal.         Assessment & Plan:     ICD-10-CM   1. Fluid collection at surgical site, subsequent encounter  T88.8XXD US  IMAGE GUIDED FLUID DRAIN BY CATHETER    2. Panniculitis  M79.3 US  IMAGE GUIDED FLUID DRAIN BY CATHETER       The left drain was pulled and we will try to get radiology to drain the seroma on the right side.  In the meantime continue with compression and walking is fine but limit bending activities.

## 2024-02-21 NOTE — Progress Notes (Signed)
 Drain placement orders.

## 2024-02-21 NOTE — Progress Notes (Signed)
 [name], MD  [facility] This can be done in any room where we can fit a bed, a Dr, and an Ultrasound machine.       Previous Messages    ----- Message ----- From: Asheley Hellberg Sent: 02/21/2024   3:21 PM EDT To: [name] [name], MD Subject: RE: US  IMAGE GUIDED FLUID DRAIN BY CATHETER    Dr. [name] , is this something that can be done in IR ?  We are pretty booked up so I was trying to see where I can get her in the soonest . ----- Message ----- From: [name], MD Sent: 02/21/2024   1:11 PM EDT To: Eleanor Mighty Subject: RE: US  IMAGE GUIDED FLUID DRAIN BY CATHETER    PROCEDURE / BIOPSY REVIEW Date: 02/21/24  Requested Biopsy site: anterior abdominal wall Reason for request: 2 post op fluid collections need aspiration/culture Imaging review: Best seen on U/S  Decision: Approved Imaging modality to perform: Ultrasound Schedule with: No sedation / Local anesthetic Schedule for: Any VIR  Additional comments: @Schedulers .  Please contact me with questions, concerns, or if issue pertaining to this request arise.  [name] [name], MD [facility] ----- Message ----- From: Lulamae Skorupski Sent: 02/21/2024  11:11 AM EDT To: Beatrice Sehgal; Ir Procedure Requests Subject: US  IMAGE GUIDED FLUID DRAIN BY CATHETER        Procedure : US  IMAGE GUIDED FLUID DRAIN BY CATHETER  Reason: 1.6 x 4.1x6.5 cm fluid collection RLQ abdomen s/p panniculectomy/abdominoplasty and 0.8 x 1 x 2.1 cm fluid collection anterior abdominal wall. Dx: Fluid collection at surgical site, subsequent encounter [T88.8XXD (ICD-10-CM)]; Panniculitis [M79.3 (ICD-10-CM)]    History : US  abd limited  Provider : Lenis Donnice PARAS, PA-C Contact : [phone]

## 2024-02-21 NOTE — Addendum Note (Signed)
 Addended by: LOWERY ESTEFANA RAMAN on: 02/21/2024 12:02 PM   Modules accepted: Orders

## 2024-02-22 ENCOUNTER — Encounter: Admitting: Student

## 2024-02-23 ENCOUNTER — Other Ambulatory Visit: Payer: Self-pay | Admitting: Student

## 2024-02-24 ENCOUNTER — Ambulatory Visit (HOSPITAL_COMMUNITY)
Admission: RE | Admit: 2024-02-24 | Discharge: 2024-02-24 | Disposition: A | Discharge status: Home / Self Care | Source: Ambulatory Visit | Attending: Surgical

## 2024-02-24 DIAGNOSIS — M793 Panniculitis, unspecified: Secondary | ICD-10-CM | POA: Diagnosis not present

## 2024-02-24 DIAGNOSIS — T888XXD Other specified complications of surgical and medical care, not elsewhere classified, subsequent encounter: Secondary | ICD-10-CM | POA: Insufficient documentation

## 2024-02-24 MED ORDER — LIDOCAINE HCL (PF) 1 % IJ SOLN
10.0000 mL | Freq: Once | INTRAMUSCULAR | Status: AC
  Administered 2024-02-24: 10 mL via INTRADERMAL

## 2024-02-24 NOTE — Procedures (Signed)
 Interventional Radiology Procedure Note  Procedure: U/S aspiration of SubQ fluid  Complications: None  Estimated Blood Loss: < 10 mL  Findings:  35mL serous fluid aspirated  Cordella DELENA Banner, MD

## 2024-02-28 ENCOUNTER — Ambulatory Visit (INDEPENDENT_AMBULATORY_CARE_PROVIDER_SITE_OTHER): Admitting: Plastic Surgery

## 2024-02-28 DIAGNOSIS — M793 Panniculitis, unspecified: Secondary | ICD-10-CM

## 2024-02-28 NOTE — Progress Notes (Signed)
 The patient is a 43 year old female here for follow-up after undergoing a panniculectomy and abdominoplasty.  Overall she is doing well.  Her incision looks good.  She had some fluid collection in the right lower abdominal area that she had removed by radiology.  She said this seems to be doing much better.  They were some smaller areas that were not able to be drained but she is going to continue with massage and compression.  Will see her back in 2 to 3 weeks.  Pictures were obtained of the patient and placed in the chart with the patient's or guardian's permission.

## 2024-02-29 ENCOUNTER — Encounter: Payer: Self-pay | Admitting: Plastic Surgery

## 2024-02-29 LAB — AEROBIC/ANAEROBIC CULTURE W GRAM STAIN (SURGICAL/DEEP WOUND): Culture: NO GROWTH

## 2024-03-12 ENCOUNTER — Ambulatory Visit (INDEPENDENT_AMBULATORY_CARE_PROVIDER_SITE_OTHER): Admitting: Student

## 2024-03-12 DIAGNOSIS — M793 Panniculitis, unspecified: Secondary | ICD-10-CM

## 2024-03-12 NOTE — Progress Notes (Signed)
 Patient is a 43 year old female who underwent panniculectomy with upper abdominal add-on and mons plasty with Dr. Lowery on 01/16/2024.  She is 8 weeks postop.  She presents to the clinic today for postoperative follow-up.  Patient was last seen in the clinic on 02/28/2024.  At this visit, patient was overall doing well and her incision looked good.  She did have a fluid collection to the right lower abdominal area that was removed by radiology.  Today, patient reports she is overall doing well.  She states that as she has been increasing her activities, she has noticed swelling in her bilateral lower extremities by the end of the day.  She states that it goes away though when she wakes up in the morning.  She states that she has been wearing compression to her abdomen as well as compression socks.  She denies any chest pain, shortness of breath, fevers or chills.  Chaperone present on exam.  On exam, patient is sitting upright in no acute distress.  Abdomen is soft and nontender.  There is no overlying erythema.  No obvious fluid collection on exam.  Umbilicus appears to be healthy and incisions surrounding the umbilicus is well-healed.  Overall, the inferior abdominal incision is healing well, there is some superficial scabbing noted to the right lateral incision.  There is some firmness underneath the incision consistent with scar tissue.  There are no signs of infection on exam.  Bilateral lower extremities are symmetric, nonswollen on today's exam.  Recommended that patient apply Vaseline to the scabbing to her incision.  I discussed with her that she may apply scar creams to the remainder of her incisions.  Recommend she gently massage her incision 1-2 times daily.  Patient expressed understanding.  I discussed with the patient that she may start gradually increasing her activities.  I discussed with her that as she increases her activities, she may continue to notice the swelling.  Recommended she  continue to wear the compression socks as well as compression spanks.  I discussed with her that if she ever develops any chest pain, shortness of breath or swelling in 1 extremities she needs to call immediately.  Patient expressed understanding.  I would like the patient to follow back up in a few weeks.  I instructed her to call if she has any questions or concerns about anything.

## 2024-03-16 ENCOUNTER — Ambulatory Visit: Admitting: Physician Assistant

## 2024-03-16 VITALS — BP 156/102 | HR 97 | Temp 97.6°F

## 2024-03-16 DIAGNOSIS — Z9889 Other specified postprocedural states: Secondary | ICD-10-CM

## 2024-03-16 MED ORDER — CIPROFLOXACIN HCL 500 MG PO TABS: 500.0000 mg | ORAL_TABLET | Freq: Two times a day (BID) | ORAL | 0 refills | Status: AC

## 2024-03-16 MED ORDER — METRONIDAZOLE 500 MG PO TABS: 500.0000 mg | ORAL_TABLET | Freq: Three times a day (TID) | ORAL | 0 refills | Status: AC

## 2024-03-16 NOTE — Progress Notes (Signed)
 Patient is a pleasant 43 year old female s/p panniculectomy and mons plasty performed 01/16/2024 Dr. Lowery complicated by RLQ seroma requiring IR guided aspiration 02/24/2024 who presents to clinic for postoperative follow-up.  She was last seen here in clinic on 03/12/2024.  At that time, she was doing really well and increasing her activity.  She continued to wear her compressive garments.  Exam was benign aside from some firmness underneath the incision consistent with scar tissue.  No palpable fluid collection.  Today, patient reports that she noticed recurrence in her swelling underneath the incision at the same area as she previously had developed a fluid collection.  She tells me that she sterilely used a needle to poke a small hole underneath the area of swelling which led to a moderate amount of drainage.  She states that it is looking better as a result today, but is concerned about recurrence and infection.  Vitals:   03/16/24 1348  BP: (!) 156/102  Pulse: 97  Temp: 97.6 F (36.4 C)  SpO2: 100%   On exam, she does have an approximately 4 x 3 cm area of firmness right of the midline underneath the scar.  Discrete, no palpable fluid wave.  Easily discernible borders.  Possibly encapsulated.  Mild overlying erythema.  There was some excoriation just inferiorly that she states is from shaving.  No crepitus.  See picture.  Discussed antibiotics and compression with watchful waiting versus attempted aspiration.  Patient would prefer aspiration attempt.  Cleaned the area well and then attempted to aspirate with 18-gauge needle and syringe.  Withdrew scant amount of serosanguineous output.  No significant cloudiness or malodor appreciated.  Upon removal of the needle, no continued drainage with palpation of the area.  Recommend continued compression over the weekend and frequent massage.  The remainder of her exam is benign.  She is afebrile and her exam most likely reflects small encapsulated  seroma, but we will still cover her with combination of ciprofloxacin  and metronidazole .  She has already been treated with antibiotics in the past if she continues to experience recurrence.  Follow-up in 1 week, sooner if needed.  If she is not improving, she understands she may require a second IR guided drain placement.

## 2024-03-19 ENCOUNTER — Other Ambulatory Visit (HOSPITAL_COMMUNITY)
Admission: RE | Admit: 2024-03-19 | Discharge: 2024-03-19 | Disposition: A | Discharge status: Home / Self Care | Attending: Student | Admitting: Student

## 2024-03-19 ENCOUNTER — Encounter: Payer: Self-pay | Admitting: Student

## 2024-03-19 ENCOUNTER — Ambulatory Visit (INDEPENDENT_AMBULATORY_CARE_PROVIDER_SITE_OTHER): Admitting: Student

## 2024-03-19 VITALS — BP 156/94 | HR 83

## 2024-03-19 DIAGNOSIS — T888XXD Other specified complications of surgical and medical care, not elsewhere classified, subsequent encounter: Secondary | ICD-10-CM | POA: Insufficient documentation

## 2024-03-19 DIAGNOSIS — Z9889 Other specified postprocedural states: Secondary | ICD-10-CM

## 2024-03-19 NOTE — Progress Notes (Signed)
 Patient is a 43 year old female who underwent panniculectomy with upper abdominal add-on and mons plasty with Dr. Lowery on 01/16/2024.  Patient is about 2 months postop.  She presents to the clinic today with concerns about drainage from her incision.     Patient was last seen in the clinic on 03/16/2024.  At this visit, patient reported swelling underneath the incision and she reported that she had used a needle to poke a small hole underneath the area of swelling which led to a monitored amount of drainage.  On exam, there is a 4 x 3 cm area of firmness right of midline underneath the scar.  There is no palpable fluid wave, there was easily discernible borders, possibly encapsulated.  Aspiration was attempted and a scant amount of serosanguineous drainage was aspirated.  It was recommended that patient continue compression and frequent massage.  She was placed on ciprofloxacin  and metronidazole .  Today, patient reports that over the weekend she noticed swelling to the area she was draining from and was having some pain to her right abdomen.  She states that she sterilized an upholstery needle and poked the area again.  She states that after poking herself several times, a large gush of blood/drainage came out.  She reports the drainage is now slowing down a little bit.  She denies any fevers or chills.  She states that she has been taking the antibiotics as directed.  Chaperone present on exam.  On exam, patient is sitting upright in no acute distress.  Abdomen is overall soft.  There is no obvious fluid collection or fluid wave palpated on exam.  Just right of midline, there is a small area of firmness where she has been draining from.  It is mildly erythematous.  There is no significant tenderness to palpation.  No crepitus noted.  I was able to express a very small amount of somewhat cloudy serous drainage from the area.  This was cultured.  Incision otherwise appears to be well-healed.  I recommended  that patient continue with compression at all times and gentle massage of the area to help facilitate fluid drainage.  I discussed with her that she may apply a warm compress as well to the area, but to use caution and not apply the warm compress directly to the skin.  I discussed with the patient that she should not try to lance the area herself and should call us  if she feels that she is having any worsening symptoms.  She expressed understanding.  I discussed with the patient to keep a close eye on the area and on her symptoms.  I discussed with her that if she develops any worsening redness, pain, swelling, fevers or chills, she should call us .  Will plan to have the patient follow back up later this week.  Pictures were obtained of the patient and placed in the chart with the patient's or guardian's permission.

## 2024-03-22 ENCOUNTER — Ambulatory Visit (INDEPENDENT_AMBULATORY_CARE_PROVIDER_SITE_OTHER): Admitting: Student

## 2024-03-22 VITALS — BP 149/96 | HR 79

## 2024-03-22 DIAGNOSIS — Z9889 Other specified postprocedural states: Secondary | ICD-10-CM

## 2024-03-22 NOTE — Progress Notes (Signed)
 Patient is a 43 year old female who underwent panniculectomy with upper abdominal add-on and mons plasty with Dr. Lowery on 01/16/2024.  Patient is about 2 months postop.  She presents to the clinic today for further follow-up.  Patient was last seen in the clinic on 03/19/2024.  At this visit, patient had noticed drainage coming out from her incision after she had poked a hole in her incision.  On exam, the abdomen was overall soft, there is no obvious fluid collection or fluid wave palpated on exam.  Just right of midline, there is a small area of firmness where she had been draining from.  It was mildly erythematous, there is no significant tenderness to palpation or crepitus noted.  There was a very small amount of somewhat cloudy serous drainage from the area.  This was cultured.  Culture at this point has grown a few Staph aureus which is sensitive to ciprofloxacin   Today, patient reports she is overall doing well.  She states that the drainage has slowed down significantly.  She denies any fevers or chills.  She reports that she has been massaging the area and has been applying warm compresses.  She does not report any other issues or concerns at this time.  Chaperone present on exam.  On exam, patient is sitting upright in no acute distress.  Abdomen is overall soft and nontender.  There is no overlying erythema.  No obvious fluid collection palpated on exam.  Umbilicus appears to be healthy.  The incision overall appears to be healing well.  To the right midline incision, there is a small area of firmness, similar to previous exam.  The overlying erythema to this area is significantly improved.  There is a scant amount of serous drainage expressed upon deep palpation.  I discussed with the patient that I would like her to continue with her antibiotic until she is completed the entirety of the course.  I discussed with her it appears things are improving.  We did discuss that this may have been a  small encapsulated hematoma or seroma which has now significantly improved.  I recommended she continue to massage the area and continue with the dressing changes as she has been.  Patient expressed understanding.  Patient to continue with compression for now.  Will have the patient follow back up in about 2 weeks.  I instructed her to call back in the meantime though if she has any questions or concerns about anything.  Blood pressure is elevated in clinic today.  She denies any new headaches, changes in her vision.  She does not report any chest pain or shortness of breath.  Patient states that she has recently been stressed and not been sleeping a lot.  I recommended that she reach out to her primary care provider and let them know that her blood pressure has been elevated.  She expressed understanding.  Pictures were obtained of the patient and placed in the chart with the patient's or guardian's permission.

## 2024-03-24 LAB — AEROBIC/ANAEROBIC CULTURE W GRAM STAIN (SURGICAL/DEEP WOUND)

## 2024-03-26 ENCOUNTER — Encounter: Admitting: Student

## 2024-04-04 ENCOUNTER — Inpatient Hospital Stay (HOSPITAL_BASED_OUTPATIENT_CLINIC_OR_DEPARTMENT_OTHER): Admitting: Oncology

## 2024-04-04 ENCOUNTER — Encounter: Payer: Self-pay | Admitting: Oncology

## 2024-04-04 ENCOUNTER — Inpatient Hospital Stay: Attending: Oncology

## 2024-04-04 VITALS — BP 134/83 | HR 79 | Temp 99.4°F | Resp 18 | Ht 63.0 in | Wt 174.0 lb

## 2024-04-04 DIAGNOSIS — D509 Iron deficiency anemia, unspecified: Secondary | ICD-10-CM

## 2024-04-04 DIAGNOSIS — I10 Essential (primary) hypertension: Secondary | ICD-10-CM | POA: Insufficient documentation

## 2024-04-04 DIAGNOSIS — Z9884 Bariatric surgery status: Secondary | ICD-10-CM | POA: Diagnosis not present

## 2024-04-04 DIAGNOSIS — T7849XA Other allergy, initial encounter: Secondary | ICD-10-CM | POA: Diagnosis not present

## 2024-04-04 DIAGNOSIS — Z8349 Family history of other endocrine, nutritional and metabolic diseases: Secondary | ICD-10-CM | POA: Insufficient documentation

## 2024-04-04 DIAGNOSIS — D500 Iron deficiency anemia secondary to blood loss (chronic): Secondary | ICD-10-CM | POA: Insufficient documentation

## 2024-04-04 DIAGNOSIS — Z800 Family history of malignant neoplasm of digestive organs: Secondary | ICD-10-CM | POA: Insufficient documentation

## 2024-04-04 DIAGNOSIS — Z8249 Family history of ischemic heart disease and other diseases of the circulatory system: Secondary | ICD-10-CM | POA: Insufficient documentation

## 2024-04-04 DIAGNOSIS — Z833 Family history of diabetes mellitus: Secondary | ICD-10-CM | POA: Insufficient documentation

## 2024-04-04 DIAGNOSIS — Z83438 Family history of other disorder of lipoprotein metabolism and other lipidemia: Secondary | ICD-10-CM | POA: Diagnosis not present

## 2024-04-04 DIAGNOSIS — E785 Hyperlipidemia, unspecified: Secondary | ICD-10-CM | POA: Diagnosis not present

## 2024-04-04 DIAGNOSIS — Z79899 Other long term (current) drug therapy: Secondary | ICD-10-CM | POA: Diagnosis not present

## 2024-04-04 DIAGNOSIS — N920 Excessive and frequent menstruation with regular cycle: Secondary | ICD-10-CM | POA: Insufficient documentation

## 2024-04-04 LAB — CBC WITH DIFFERENTIAL (CANCER CENTER ONLY)
Abs Immature Granulocytes: 0.06 K/uL (ref 0.00–0.07)
Basophils Absolute: 0 K/uL (ref 0.0–0.1)
Basophils Relative: 1 %
Eosinophils Absolute: 0.1 K/uL (ref 0.0–0.5)
Eosinophils Relative: 2 %
HCT: 36.1 % (ref 36.0–46.0)
Hemoglobin: 11.3 g/dL — ABNORMAL LOW (ref 12.0–15.0)
Immature Granulocytes: 1 %
Lymphocytes Relative: 28 %
Lymphs Abs: 1.4 K/uL (ref 0.7–4.0)
MCH: 24.7 pg — ABNORMAL LOW (ref 26.0–34.0)
MCHC: 31.3 g/dL (ref 30.0–36.0)
MCV: 79 fL — ABNORMAL LOW (ref 80.0–100.0)
Monocytes Absolute: 0.4 K/uL (ref 0.1–1.0)
Monocytes Relative: 7 %
Neutro Abs: 3.1 K/uL (ref 1.7–7.7)
Neutrophils Relative %: 61 %
Platelet Count: 278 K/uL (ref 150–400)
RBC: 4.57 MIL/uL (ref 3.87–5.11)
RDW: 15.7 % — ABNORMAL HIGH (ref 11.5–15.5)
WBC Count: 4.9 K/uL (ref 4.0–10.5)
nRBC: 0 % (ref 0.0–0.2)

## 2024-04-04 LAB — RETIC PANEL
Immature Retic Fract: 10.9 % (ref 2.3–15.9)
RBC.: 4.54 MIL/uL (ref 3.87–5.11)
Retic Count, Absolute: 38.6 K/uL (ref 19.0–186.0)
Retic Ct Pct: 0.9 % (ref 0.4–3.1)
Reticulocyte Hemoglobin: 27.1 pg — ABNORMAL LOW (ref >27.9)

## 2024-04-04 LAB — FERRITIN: Ferritin: 5 ng/mL — ABNORMAL LOW (ref 11–307)

## 2024-04-04 LAB — IRON AND TIBC
Iron: 26 ug/dL — ABNORMAL LOW (ref 28–170)
Saturation Ratios: 7 % — ABNORMAL LOW (ref 10.4–31.8)
TIBC: 402 ug/dL (ref 250–450)
UIBC: 376 ug/dL

## 2024-04-04 NOTE — Progress Notes (Signed)
 Hematology/Oncology Consult note [phone] [phone]        REFERRING PROVIDER: Vicci Duwaine SQUIBB, DO   CHIEF COMPLAINTS/REASON FOR VISIT:  Evaluation of iron  deficiency anemia.   ASSESSMENT & PLAN:   Iron  deficiency anemia due to chronic blood loss Labs are reviewed and discussed with patient. Lab Results  Component Value Date   HGB 11.3 (L) 04/04/2024   TIBC 402 04/04/2024   IRONPCTSAT 7 (L) 04/04/2024   FERRITIN 5 (L) 04/04/2024    Lab results are consistent with iron  deficiency anemia. She had Allergic reaction to Dermabond glue which occurred after Venofer  treatment.  Shared decision was made to hold off Venofer  for now until her surgery wound is healed.  Recommend iron  rich food, vitamin c and otc iron  supplementation.    Orders Placed This Encounter  Procedures   CBC with Differential (Cancer Center Only)    Standing Status:   Future    Expected Date:   07/04/2024    Expiration Date:   10/02/2024   Iron  and TIBC    Standing Status:   Future    Expected Date:   07/04/2024    Expiration Date:   10/02/2024   Ferritin    Standing Status:   Future    Expected Date:   07/04/2024    Expiration Date:   10/02/2024   Retic Panel    Standing Status:   Future    Expected Date:   07/04/2024    Expiration Date:   10/02/2024   Follow-up in 3 months. All questions were answered. The patient knows to call the clinic with any problems, questions or concerns.  Zelphia Cap, MD, PhD [facility] Hematology Oncology 04/04/2024   HISTORY OF PRESENTING ILLNESS:   Bonnie Armstrong is a  43 y.o.  female with PMH listed below was seen in consultation at the request of  Vicci, Megan P, DO  for evaluation of iron  deficiency anemia  Discussed the use of AI scribe software for clinical note transcription with the patient, who gave verbal consent to proceed.   She reports he history of iron  deficiency anemia, since her modified duodenal switch surgery in 2023.   12/28/2023 hemoglobin 10.4, MCV 74, iron  saturation 4, TIBC 409, ferritin 5.  She has been on iron  supplements, including ferrous sulfate , since the surgery, but there has been minimal improvement in her anemia.  She underwent a modified duodenal switch surgery in 2023' Despite taking oral iron  supplements, she continues to experience low hemoglobin levels. She experiences heavy menstrual bleeding, describing it as 'an astronomical amount' that does not respond to birth control. She underwent an endometrial ablation on Dec 19, 2023, and is currently awaiting to see if it has reduced her menstrual flow.  She is scheduled for a paniculectomy and Monsplasty on January 16, 2024, due to complications from previous pregnancies and significant abdominal skin issues.  She consumes at least three cups of ice daily, which may indicate pica, a condition often associated with iron  deficiency anemia. No constipation issues that cannot be managed with over-the-counter medications, and no blood in her stool or dark stools.  INTERVAL HISTORY AYALA RIBBLE is a 43 y.o. female who has above history reviewed by me today presents for follow up visit for iron  deficiency anemia.   She tolerated 2 doses of Venofer .  S/p underwent panniculectomy with upper abdominal add-on and mons plasty with Dr. Lowery on 01/16/2024  She developed allergic reaction to Dermabond glue, and this happened after  she got 3rd dose of Venofer . 4th dose was held.  Menstrual bleeding has improved, not as heavy as before.  She feels well today   MEDICAL HISTORY:  Past Medical History:  Diagnosis Date   Abnormal uterine bleeding (AUB)    Anemia    DM (diabetes mellitus), type 2 (HCC)    had bariatric surgery and last weight and a1c is now 5.3 as of 10-10-23-no meds   Gestational diabetes    Hyperlipidemia    Hypertension    had bariatric surgery lost weight and was able to come off bp meds   IFG (impaired fasting glucose)     Menorrhagia with regular cycle    Morbid obesity (HCC) 02/28/2015   Panniculitis    S/P gastric sleeve procedure     SURGICAL HISTORY: Past Surgical History:  Procedure Laterality Date   BARIATRIC SURGERY  02/26/2022   CESAREAN SECTION  08/16/2000   HYSTEROSCOPY WITH D & C N/A 12/19/2023   HSC w/NOVASURE ABLATION   WISDOM TOOTH EXTRACTION      SOCIAL HISTORY: Social History   Socioeconomic History   Marital status: Married    Spouse name: Not on file   Number of children: Not on file   Years of education: Not on file   Highest education level: Bachelor's degree (e.g., BA, AB, BS)  Occupational History   Not on file  Tobacco Use   Smoking status: Never   Smokeless tobacco: Never  Vaping Use   Vaping status: Never Used  Substance and Sexual Activity   Alcohol use: No   Drug use: No   Sexual activity: Yes    Birth control/protection: None  Other Topics Concern   Not on file  Social History Narrative   Not on file   Social Drivers of Health   Financial Resource Strain: Low Risk  (12/28/2023)   Overall Financial Resource Strain (CARDIA)    Difficulty of Paying Living Expenses: Not very hard  Food Insecurity: No Food Insecurity (12/28/2023)   Hunger Vital Sign    Worried About Running Out of Food in the Last Year: Never true    Ran Out of Food in the Last Year: Never true  Transportation Needs: No Transportation Needs (12/28/2023)   PRAPARE - Administrator, Civil Service (Medical): No    Lack of Transportation (Non-Medical): No  Physical Activity: Sufficiently Active (12/28/2023)   Exercise Vital Sign    Days of Exercise per Week: 5 days    Minutes of Exercise per Session: 30 min  Stress: No Stress Concern Present (12/28/2023)   [employer] of Occupational Health - Occupational Stress Questionnaire    Feeling of Stress : Not at all  Social Connections: Socially Integrated (12/28/2023)   Social Connection and Isolation Panel    Frequency of  Communication with Friends and Family: More than three times a week    Frequency of Social Gatherings with Friends and Family: More than three times a week    Attends Religious Services: More than 4 times per year    Active Member of [employer] or Organizations: Yes    Attends Banker Meetings: 1 to 4 times per year    Marital Status: Married  Catering manager Violence: Not At Risk (01/03/2024)   Humiliation, Afraid, Rape, and Kick questionnaire    Fear of Current or Ex-Partner: No    Emotionally Abused: No    Physically Abused: No    Sexually Abused: No    FAMILY HISTORY:  Family History  Problem Relation Age of Onset   Hypertension Mother    Hyperlipidemia Mother    Hypothyroidism Mother    CAD Father    Heart disease Father    Diabetes Maternal Grandfather    COPD Paternal Grandmother    Cancer Paternal Grandfather        colon    ALLERGIES:  is allergic to other.  MEDICATIONS:  Current Outpatient Medications  Medication Sig Dispense Refill   CALCIUM  CITRATE PO Take 600 mg by mouth 3 (three) times daily. [place] Calcium  Citrate     Cholecalciferol (FT VITAMIN D3) 125 MCG (5000 UT) TABS Take 5,000 Units by mouth daily with lunch. Bariactric Advantage Vitamin D  Capsule 5,000 IU     Ferrous Gluconate-C-Folic Acid  (IRON -C PO) Take 1 tablet by mouth in the morning. Bariatric Fusion Iron  Supplement 60mg  with Vitamin C     Multiple Vitamins-Minerals (BARIATRIC MULTIVITAMIN/IRON  PO) Take 1 tablet by mouth in the morning. BariMelts Once Daily Bariatric Multivitamin with Iron      phentermine 30 MG capsule Take 30 mg by mouth every morning.     topiramate (TOPAMAX) 25 MG tablet Take 25 mg by mouth 2 (two) times daily.     No current facility-administered medications for this visit.    Review of Systems  Constitutional:  Positive for fatigue. Negative for appetite change, chills and fever.  HENT:   Negative for hearing loss and voice change.   Eyes:  Negative for eye  problems.  Respiratory:  Negative for chest tightness and cough.   Cardiovascular:  Negative for chest pain and palpitations.  Gastrointestinal:  Negative for abdominal distention, abdominal pain and blood in stool.  Endocrine: Negative for hot flashes.  Genitourinary:  Negative for difficulty urinating and frequency.   Musculoskeletal:  Negative for arthralgias.  Skin:  Negative for itching and rash.  Neurological:  Negative for extremity weakness.  Hematological:  Negative for adenopathy.  Psychiatric/Behavioral:  Positive for sleep disturbance. Negative for confusion.    PHYSICAL EXAMINATION: ECOG PERFORMANCE STATUS: 1 - Symptomatic but completely ambulatory Vitals:   04/04/24 1045  BP: 134/83  Pulse: 79  Resp: 18  Temp: 99.4 F (37.4 C)  SpO2: 100%   Filed Weights   04/04/24 1045  Weight: 174 lb (78.9 kg)    Physical Exam Constitutional:      General: She is not in acute distress. HENT:     Head: Normocephalic and atraumatic.  Eyes:     General: No scleral icterus. Cardiovascular:     Rate and Rhythm: Normal rate and regular rhythm.     Heart sounds: Normal heart sounds.  Pulmonary:     Effort: Pulmonary effort is normal. No respiratory distress.     Breath sounds: No wheezing.  Abdominal:     General: Bowel sounds are normal. There is no distension.     Palpations: Abdomen is soft.  Musculoskeletal:        General: No deformity. Normal range of motion.     Cervical back: Normal range of motion and neck supple.  Skin:    General: Skin is warm and dry.     Findings: No erythema or rash.  Neurological:     Mental Status: She is alert and oriented to person, place, and time. Mental status is at baseline.     Cranial Nerves: No cranial nerve deficit.  Psychiatric:        Mood and Affect: Mood normal.     LABORATORY DATA:  I have reviewed the data as listed    Latest Ref Rng & Units 04/04/2024   10:20 AM 12/28/2023    9:38 AM 12/15/2023   10:38 AM  CBC   WBC 4.0 - 10.5 K/uL 4.9  5.4  5.7   Hemoglobin 12.0 - 15.0 g/dL 88.6  89.5  9.6   Hematocrit 36.0 - 46.0 % 36.1  35.6  31.8   Platelets 150 - 400 K/uL 278  328  285       Latest Ref Rng & Units 12/28/2023    9:38 AM 10/10/2023    3:32 PM 07/22/2022   10:29 AM  CMP  Glucose 70 - 99 mg/dL 98  78  93   BUN 6 - 24 mg/dL 14  15  13    Creatinine 0.57 - 1.00 mg/dL 9.25  9.35  9.36   Sodium 134 - 144 mmol/L 138  137  142   Potassium 3.5 - 5.2 mmol/L 4.2  3.4  3.8   Chloride 96 - 106 mmol/L 105  104  105   CO2 20 - 29 mmol/L 21  21  24    Calcium  8.7 - 10.2 mg/dL 8.9  9.1  8.8   Total Protein 6.0 - 8.5 g/dL  6.7  6.2   Total Bilirubin 0.0 - 1.2 mg/dL  0.4  0.3   Alkaline Phos 44 - 121 IU/L  84  76   AST 0 - 40 IU/L  30  20   ALT 0 - 32 IU/L  37  23       RADIOGRAPHIC STUDIES: I have personally reviewed the radiological images as listed and agreed with the findings in the report. US  IMAGE GUIDED DRAINAGE BY PERCUTANEOUS CATHETER Result Date: 02/25/2024 CLINICAL DATA:  Postoperative fluid collections in the subcutaneous tissue in the patient's anterior abdominal wall. Plan for evaluation and possible drainage. EXAM: US  IMAGE GUIDED aspiration TECHNIQUE: Ultrasound COMPARISON:  None Available. FINDINGS: In a supine position, the patient's abdominal wall was evaluated with ultrasound. As the fluid collections previously seen on the limited abdomen ultrasound appear smaller today. The patient was then shifted to a left lateral decubitus position and a some of the fluid began to pool into a more central location. In the supine position no single fluid collection was large enough to attempt drainage. In the left lateral decubitus position, the central collection was large enough to aspirate. The patient's skin was prepped and draped in usual sterile fashion. Local anesthesia was achieved with 1% lidocaine . A 10 cm Yueh needle was then advanced under ultrasound guidance from a small incision to the fluid  collection in the subcutaneous tissue. Approximately 35 mL of serous fluid was withdrawn and the collection was completely evacuated. The fluid was sent for culture and sensitivity. Sterile dressing applied. IMPRESSION: Successful aspiration of a small fluid collection yielding 35 mL of serous fluid sent to laboratory for culture and sensitivity. Electronically Signed   By: Cordella Banner   On: 02/25/2024 07:34   US  Abdomen Limited Result Date: 02/21/2024 CLINICAL DATA:  swelling, fluid collection R abdomen. EXAM: ULTRASOUND ABDOMEN LIMITED COMPARISON:  None Available. FINDINGS: In the area of concern, In the right lower quadrant anterior abdominal wall, There is a 1.6 x 4.1 x 6.5 cm anechoic collection in the subcutaneous fat superficial to the abdominal wall muscles. There are few hypoechoic/isoechoic areas within. No abnormal vascularity within. The collection exhibits thin smooth wall. Findings favor complex walled of collection within the anterior abdominal wall. In  the midline, there is another smaller 0.8 x 1.0 x 2.1 cm similar characteristic collection in the anterior abdominal wall as well. IMPRESSION: There are 2 walled off collections in the anterior abdominal wall, as described above. Electronically Signed   By: Ree Molt M.D.   On: 02/21/2024 10:00

## 2024-04-04 NOTE — Progress Notes (Signed)
 Patient is doing good, feeling much better since her infection. She had her surgery not too long ago and had some complications afterwards, but she is now getting back to normal.

## 2024-04-04 NOTE — Assessment & Plan Note (Addendum)
 Labs are reviewed and discussed with patient. Lab Results  Component Value Date   HGB 11.3 (L) 04/04/2024   TIBC 402 04/04/2024   IRONPCTSAT 7 (L) 04/04/2024   FERRITIN 5 (L) 04/04/2024    Lab results are consistent with iron  deficiency anemia. She had Allergic reaction to Dermabond glue which occurred after Venofer  treatment.  Shared decision was made to hold off Venofer  for now until her surgery wound is healed.  Recommend iron  rich food, vitamin c and otc iron  supplementation.

## 2024-04-06 ENCOUNTER — Encounter: Admitting: Physician Assistant

## 2024-04-13 ENCOUNTER — Encounter: Admitting: Student

## 2024-04-18 ENCOUNTER — Ambulatory Visit: Admitting: Family Medicine

## 2024-04-18 ENCOUNTER — Ambulatory Visit (INDEPENDENT_AMBULATORY_CARE_PROVIDER_SITE_OTHER): Admitting: Family Medicine

## 2024-04-18 VITALS — BP 134/88 | HR 93 | Temp 98.0°F | Ht 63.0 in | Wt 174.0 lb

## 2024-04-18 DIAGNOSIS — I10 Essential (primary) hypertension: Secondary | ICD-10-CM | POA: Diagnosis not present

## 2024-04-18 DIAGNOSIS — R601 Generalized edema: Secondary | ICD-10-CM | POA: Diagnosis not present

## 2024-04-18 DIAGNOSIS — E782 Mixed hyperlipidemia: Secondary | ICD-10-CM | POA: Diagnosis not present

## 2024-04-18 DIAGNOSIS — Z8639 Personal history of other endocrine, nutritional and metabolic disease: Secondary | ICD-10-CM | POA: Diagnosis not present

## 2024-04-18 DIAGNOSIS — Z789 Other specified health status: Secondary | ICD-10-CM | POA: Diagnosis not present

## 2024-04-18 DIAGNOSIS — Z1211 Encounter for screening for malignant neoplasm of colon: Secondary | ICD-10-CM

## 2024-04-18 DIAGNOSIS — Z903 Acquired absence of stomach [part of]: Secondary | ICD-10-CM | POA: Diagnosis not present

## 2024-04-18 LAB — BAYER DCA HB A1C WAIVED: HB A1C (BAYER DCA - WAIVED): 5.5 % (ref 4.8–5.6)

## 2024-04-18 MED ORDER — HYDROCHLOROTHIAZIDE 25 MG PO TABS: 25.0000 mg | ORAL_TABLET | Freq: Every day | ORAL | 3 refills | Status: AC | PRN

## 2024-04-18 NOTE — Progress Notes (Unsigned)
 BP 134/88 (BP Location: Left Arm, Patient Position: Sitting, Cuff Size: Normal)   Pulse 93   Temp 98 F (36.7 C) (Oral)   Ht 5' 3 (1.6 m)   Wt 174 lb (78.9 kg)   SpO2 99%   BMI 30.82 kg/m    Subjective:    Patient ID: Bonnie Armstrong, female    DOB: 1981-04-14, 43 y.o.   MRN: [id number]  HPI: Bonnie Armstrong is a 43 y.o. female  Chief Complaint  Patient presents with   Anemia   Did well with the ablation. Still having occasional bleeding. Iron  levels and hemoglobin have not significantly improved since the ablation, but she just had major surgery as well. She notes that she is starting to feel better. She notes that she had significant surgery  with her paniculectomy and has been dealing with post-op complications. She had several seromas and infections. She is starting to feel better, but it's been a lot.   HISTORY OF DIABETES Hypoglycemic episodes:no Polydipsia/polyuria: no Visual disturbance: no Chest pain: no Paresthesias: no Glucose Monitoring: no Taking Insulin?: no Blood Pressure Monitoring: a few times a month Diabetic Education: Completed Pneumovax: Up to Date Influenza: Not up to Date Aspirin: no  HYPERLIPIDEMIA Hyperlipidemia status: improved, doing well Satisfied with current treatment?  yes Side effects:  no Medication compliance: N/A Past cholesterol meds: none Supplements: none Aspirin:  no The 10-year ASCVD risk score (Arnett DK, et al., 2019) is: 1.4%   Values used to calculate the score:     Age: 63 years     Clincally relevant sex: Female     Is Non-Hispanic African American: No     Diabetic: Yes     Tobacco smoker: No     Systolic Blood Pressure: 134 mmHg     Is BP treated: Yes     HDL Cholesterol: 53 mg/dL     Total Cholesterol: 140 mg/dL Chest pain:  no Coronary artery disease:  no Family history CAD:  yes   Relevant past medical, surgical, family and social history reviewed and updated as indicated. Interim medical history  since our last visit reviewed. Allergies and medications reviewed and updated.  Review of Systems  Constitutional: Negative.   Respiratory: Negative.    Cardiovascular: Negative.   Musculoskeletal: Negative.   Neurological: Negative.   Psychiatric/Behavioral: Negative.      Per HPI unless specifically indicated above     Objective:    BP 134/88 (BP Location: Left Arm, Patient Position: Sitting, Cuff Size: Normal)   Pulse 93   Temp 98 F (36.7 C) (Oral)   Ht 5' 3 (1.6 m)   Wt 174 lb (78.9 kg)   SpO2 99%   BMI 30.82 kg/m   Wt Readings from Last 3 Encounters:  04/18/24 174 lb (78.9 kg)  04/04/24 174 lb (78.9 kg)  02/10/24 165 lb (74.8 kg)    Physical Exam Vitals and nursing note reviewed.  Constitutional:      General: She is not in acute distress.    Appearance: Normal appearance. She is not ill-appearing, toxic-appearing or diaphoretic.  HENT:     Head: Normocephalic and atraumatic.     Right Ear: External ear normal.     Left Ear: External ear normal.     Nose: Nose normal.     Mouth/Throat:     Mouth: Mucous membranes are moist.     Pharynx: Oropharynx is clear.  Eyes:     General: No scleral icterus.  Right eye: No discharge.        Left eye: No discharge.     Extraocular Movements: Extraocular movements intact.     Conjunctiva/sclera: Conjunctivae normal.     Pupils: Pupils are equal, round, and reactive to light.  Cardiovascular:     Rate and Rhythm: Normal rate and regular rhythm.     Pulses: Normal pulses.     Heart sounds: Normal heart sounds. No murmur heard.    No friction rub. No gallop.  Pulmonary:     Effort: Pulmonary effort is normal. No respiratory distress.     Breath sounds: Normal breath sounds. No stridor. No wheezing, rhonchi or rales.  Chest:     Chest wall: No tenderness.  Musculoskeletal:        General: Normal range of motion.     Cervical back: Normal range of motion and neck supple.  Skin:    General: Skin is warm and  dry.     Capillary Refill: Capillary refill takes less than 2 seconds.     Coloration: Skin is not jaundiced or pale.     Findings: No bruising, erythema, lesion or rash.  Neurological:     General: No focal deficit present.     Mental Status: She is alert and oriented to person, place, and time. Mental status is at baseline.  Psychiatric:        Mood and Affect: Mood normal.        Behavior: Behavior normal.        Thought Content: Thought content normal.        Judgment: Judgment normal.     Results for orders placed or performed in visit on 04/18/24  Bayer DCA Hb A1c Waived   Collection Time: 04/18/24 10:05 AM  Result Value Ref Range   HB A1C (BAYER DCA - WAIVED) 5.5 4.8 - 5.6 %  Comprehensive metabolic panel with GFR   Collection Time: 04/18/24 10:05 AM  Result Value Ref Range   Glucose 95 70 - 99 mg/dL   BUN 15 6 - 24 mg/dL   Creatinine, Ser 9.34 0.57 - 1.00 mg/dL   eGFR 888 >40 fO/fpw/8.26   BUN/Creatinine Ratio 23 9 - 23   Sodium 139 134 - 144 mmol/L   Potassium 3.9 3.5 - 5.2 mmol/L   Chloride 105 96 - 106 mmol/L   CO2 20 20 - 29 mmol/L   Calcium  8.7 8.7 - 10.2 mg/dL   Total Protein 6.2 6.0 - 8.5 g/dL   Albumin 4.2 3.9 - 4.9 g/dL   Globulin, Total 2.0 1.5 - 4.5 g/dL   Bilirubin Total 0.3 0.0 - 1.2 mg/dL   Alkaline Phosphatase 79 44 - 121 IU/L   AST 19 0 - 40 IU/L   ALT 27 0 - 32 IU/L  Lipid Panel w/o Chol/HDL Ratio   Collection Time: 04/18/24 10:05 AM  Result Value Ref Range   Cholesterol, Total 140 100 - 199 mg/dL   Triglycerides 72 0 - 149 mg/dL   HDL 53 >60 mg/dL   VLDL Cholesterol Cal 14 5 - 40 mg/dL   LDL Chol Calc (NIH) 73 0 - 99 mg/dL  Hepatitis B surface antibody,quantitative   Collection Time: 04/18/24 10:05 AM  Result Value Ref Range   Hepatitis B Surf Ab Quant <3.5 (L) Immunity>10 mIU/mL      Assessment & Plan:   Problem List Items Addressed This Visit       Cardiovascular and Mediastinum   Essential hypertension  Doing great off  medicine. Having significant swelling. Will start hydrochlorothiazide  PRN. Call with any concerns.       Relevant Medications   hydrochlorothiazide  (HYDRODIURIL ) 25 MG tablet     Other   Hyperlipidemia, mixed   Rechecking labs today. Await results. Treat as needed.       Relevant Medications   hydrochlorothiazide  (HYDRODIURIL ) 25 MG tablet   Other Relevant Orders   Comprehensive metabolic panel with GFR (Completed)   Lipid Panel w/o Chol/HDL Ratio (Completed)   S/P gastric sleeve procedure   Rechecking labs today. Await results. Treat as needed.       Relevant Orders   Bayer DCA Hb A1c Waived (Completed)   Comprehensive metabolic panel with GFR (Completed)   History of diabetes mellitus   Rechecking labs today. Await results. Treat as needed.       Other Visit Diagnoses       Generalized edema    -  Primary   Will start her on HCTZ PRN. Call with any concerns. Continue to monitor.     Hepatitis B vaccination status unknown       Labs drawn today. Await results. Treat as needed.   Relevant Orders   Hepatitis B surface antibody,quantitative (Completed)     Screening for colon cancer       Referral to GI placed today.   Relevant Orders   Ambulatory referral to Gastroenterology        Follow up plan: Return in about 6 weeks (around 05/30/2024).

## 2024-04-19 ENCOUNTER — Other Ambulatory Visit: Payer: Self-pay

## 2024-04-19 ENCOUNTER — Encounter: Payer: Self-pay | Admitting: Oncology

## 2024-04-19 ENCOUNTER — Ambulatory Visit (INDEPENDENT_AMBULATORY_CARE_PROVIDER_SITE_OTHER): Admitting: Student

## 2024-04-19 DIAGNOSIS — Z719 Counseling, unspecified: Secondary | ICD-10-CM

## 2024-04-19 DIAGNOSIS — Z1211 Encounter for screening for malignant neoplasm of colon: Secondary | ICD-10-CM

## 2024-04-19 DIAGNOSIS — Z9889 Other specified postprocedural states: Secondary | ICD-10-CM

## 2024-04-19 LAB — COMPREHENSIVE METABOLIC PANEL WITH GFR
ALT: 27 IU/L (ref 0–32)
AST: 19 IU/L (ref 0–40)
Albumin: 4.2 g/dL (ref 3.9–4.9)
Alkaline Phosphatase: 79 IU/L (ref 44–121)
BUN/Creatinine Ratio: 23 (ref 9–23)
BUN: 15 mg/dL (ref 6–24)
Bilirubin Total: 0.3 mg/dL (ref 0.0–1.2)
CO2: 20 mmol/L (ref 20–29)
Calcium: 8.7 mg/dL (ref 8.7–10.2)
Chloride: 105 mmol/L (ref 96–106)
Creatinine, Ser: 0.65 mg/dL (ref 0.57–1.00)
Globulin, Total: 2 g/dL (ref 1.5–4.5)
Glucose: 95 mg/dL (ref 70–99)
Potassium: 3.9 mmol/L (ref 3.5–5.2)
Sodium: 139 mmol/L (ref 134–144)
Total Protein: 6.2 g/dL (ref 6.0–8.5)
eGFR: 111 mL/min/1.73 (ref >59)

## 2024-04-19 LAB — LIPID PANEL W/O CHOL/HDL RATIO
Cholesterol, Total: 140 mg/dL (ref 100–199)
HDL: 53 mg/dL (ref >39)
LDL Chol Calc (NIH): 73 mg/dL (ref 0–99)
Triglycerides: 72 mg/dL (ref 0–149)
VLDL Cholesterol Cal: 14 mg/dL (ref 5–40)

## 2024-04-19 LAB — HEPATITIS B SURFACE ANTIBODY, QUANTITATIVE: Hepatitis B Surf Ab Quant: <3.5 m[IU]/mL — ABNORMAL LOW

## 2024-04-19 MED ORDER — NA SULFATE-K SULFATE-MG SULF 17.5-3.13-1.6 GM/177ML PO SOLN: 1.0000 | Freq: Once | ORAL | 0 refills | Status: AC

## 2024-04-19 NOTE — Assessment & Plan Note (Signed)
 Rechecking labs today. Await results. Treat as needed.

## 2024-04-19 NOTE — Assessment & Plan Note (Signed)
 Doing great off medicine. Having significant swelling. Will start hydrochlorothiazide  PRN. Call with any concerns.

## 2024-04-19 NOTE — Progress Notes (Signed)
 Patient is a 43 year old female who underwent panniculectomy with upper abdominal add-on and mons plasty with Dr. Lowery on 01/16/2024.  Patient is about 3 months postop.  She presents to the clinic today for further postoperative follow-up.     Patient was last seen in the clinic on 03/22/2024.  At this visit, patient was doing well.  On exam, abdomen was overall soft and nontender.  There is no overlying erythema, no obvious fluid collections on exam.  Umbilicus was healthy.  To the right midline incision, there is a small area of firmness which appear to be significantly improved.  There was a scant amount of serous drainage expressed upon palpation.  It was recommended that patient continue to massage the area.  Today, patient reports she is overall doing well.  She states that she has not had any new drainage from her incision.  She states that she did see her primary care provider who placed her on a diuretic which she feels has been helping with her fluid retention.  She also states that she has been continuing to wear her compression.  She does not report any infectious symptoms.  She states that she has a protrusion on her bellybutton and that middle of her inferior abdomen is folding over a little bit, and she is Speir some that this will cause rashes what she had before.  Chaperone present on exam.  On exam, patient is sitting upright in no acute distress.  Abdomen is soft and nontender.  There is no obvious fluid collection on exam.  There is no overlying erythema.  Incisions appear to be well-healed.  There is still little bit of firmness just right of midline, but much improved since previous exam.  There is a little bit of excess tissue near the superior aspect of the umbilicus.  There are no signs of infection on exam.  I discussed with the patient that she may continue to wear her compression, but if she feels like she has swelling, she may put her compression back on.  She expressed  understanding.  I recommended that she continue to massage her incision and continue to monitor things closely.  I recommended that she start using scar creams to her incisions.  She expressed understanding.  In regards to the umbilicus and the folding skin, I recommended that patient give it a little bit more time to settle out, but I also recommended that she follow-up with Dr. Lowery to discuss these areas in a few months.  She expressed understanding.  And was in agreement with this.  I discussed with the patient to continue to closely monitor her abdomen and if she has any concerns she may call us  sooner.  Patient to follow back up in about 3 months.  Pictures were obtained of the patient and placed in the chart with the patient's or guardian's permission.

## 2024-05-17 DIAGNOSIS — K912 Postsurgical malabsorption, not elsewhere classified: Secondary | ICD-10-CM | POA: Diagnosis not present

## 2024-05-17 DIAGNOSIS — Z713 Dietary counseling and surveillance: Secondary | ICD-10-CM | POA: Diagnosis not present

## 2024-05-17 DIAGNOSIS — Z9884 Bariatric surgery status: Secondary | ICD-10-CM | POA: Diagnosis not present

## 2024-05-30 ENCOUNTER — Ambulatory Visit: Admitting: Family Medicine

## 2024-06-25 ENCOUNTER — Ambulatory Visit: Admitting: Plastic Surgery

## 2024-06-25 ENCOUNTER — Encounter: Payer: Self-pay | Admitting: Plastic Surgery

## 2024-06-25 VITALS — BP 157/92 | HR 104 | Wt 168.0 lb

## 2024-06-25 DIAGNOSIS — L905 Scar conditions and fibrosis of skin: Secondary | ICD-10-CM | POA: Diagnosis not present

## 2024-06-25 DIAGNOSIS — M793 Panniculitis, unspecified: Secondary | ICD-10-CM

## 2024-06-25 DIAGNOSIS — Z9884 Bariatric surgery status: Secondary | ICD-10-CM | POA: Diagnosis not present

## 2024-06-25 DIAGNOSIS — Z903 Acquired absence of stomach [part of]: Secondary | ICD-10-CM

## 2024-06-25 NOTE — Progress Notes (Signed)
   Subjective:    Patient ID: Bonnie Armstrong, female    DOB: 12-03-1980, 43 y.o.   MRN: [id number]  The patient is a 43 year old female joining me today for further discussion about her abdomen.  The patient is 5 feet 3 inches tall.  She has lost significant amount of weight after bariatric surgery.  She underwent a panniculectomy about 9 months ago.  She had a lot of challenges after surgery.  She had a severe skin reaction to the Dermabond and Steri-Strips.  This created a lot of skin breakdown and slow healing.  I think that that resulted in some scar contractures.  When she is laying down it hides well but when she stands up it is very noticeable.  The worst areas on the left.  The patient states that if she coughs or even laughs that she feels it pull.  I can see the exactly where she is talking about.  Patient is interested in having capsule excision.  I think that that will make a big difference and release that tension.  I think this is also leading to some lymphedema with the area becoming more swollen at certain times during the day when she has been more active.  She is still wearing compression so she is doing what she can do to alleviate this but I think the scarring has created a lymph flow problem.    Review of Systems  Constitutional: Negative.   Eyes: Negative.   Respiratory: Negative.    Cardiovascular: Negative.   Gastrointestinal: Negative.   Endocrine: Negative.   Genitourinary: Negative.   Skin:  Positive for wound.       Objective:   Physical Exam Vitals reviewed.  Constitutional:      Appearance: Normal appearance.  HENT:     Head: Atraumatic.  Cardiovascular:     Rate and Rhythm: Normal rate.     Pulses: Normal pulses.  Pulmonary:     Effort: Pulmonary effort is normal.  Abdominal:     Palpations: Abdomen is soft.  Musculoskeletal:        General: Swelling and tenderness present.  Skin:    General: Skin is warm.     Capillary Refill: Capillary refill  takes less than 2 seconds.     Coloration: Skin is not jaundiced.     Findings: Lesion present. No bruising.  Neurological:     Mental Status: She is alert and oriented to person, place, and time.  Psychiatric:        Mood and Affect: Mood normal.        Behavior: Behavior normal.        Thought Content: Thought content normal.        Judgment: Judgment normal.         Assessment & Plan:     ICD-10-CM   1. S/P gastric sleeve procedure  Z90.3     2. Panniculitis  M79.3     3. Scar contracture  L90.5       Pictures were obtained of the patient and placed in the chart with the patient's or guardian's permission.  Plan for capsule excision of abdominal pannus surgery.  The area is likely to by 10 cm.

## 2024-06-25 NOTE — Addendum Note (Signed)
 Addended by: LOWERY ESTEFANA RAMAN on: 06/25/2024 02:32 PM   Modules accepted: Orders

## 2024-06-28 ENCOUNTER — Ambulatory Visit: Admitting: Family Medicine

## 2024-07-02 ENCOUNTER — Inpatient Hospital Stay

## 2024-07-02 ENCOUNTER — Inpatient Hospital Stay: Admitting: Oncology

## 2024-07-02 ENCOUNTER — Telehealth: Payer: Self-pay | Admitting: Oncology

## 2024-07-02 NOTE — Telephone Encounter (Signed)
 Called pt due to missed appt - pt said her friend is in the hospital and she forgot about her appt for today - got appt r/s to 11/20 w/pt - LH

## 2024-07-02 NOTE — Assessment & Plan Note (Deleted)
 Labs are reviewed and discussed with patient. Lab Results  Component Value Date   HGB 11.3 (L) 04/04/2024   TIBC 402 04/04/2024   IRONPCTSAT 7 (L) 04/04/2024   FERRITIN 5 (L) 04/04/2024    Lab results are consistent with iron  deficiency anemia. She had Allergic reaction to Dermabond glue which occurred after Venofer  treatment.  Shared decision was made to hold off Venofer  for now until her surgery wound is healed.  Recommend iron  rich food, vitamin c and otc iron  supplementation.

## 2024-07-05 ENCOUNTER — Inpatient Hospital Stay: Attending: Oncology

## 2024-07-05 ENCOUNTER — Encounter: Payer: Self-pay | Admitting: Oncology

## 2024-07-05 ENCOUNTER — Ambulatory Visit: Payer: Self-pay | Admitting: Oncology

## 2024-07-05 ENCOUNTER — Inpatient Hospital Stay (HOSPITAL_BASED_OUTPATIENT_CLINIC_OR_DEPARTMENT_OTHER): Admitting: Oncology

## 2024-07-05 VITALS — BP 139/89 | HR 53 | Temp 97.6°F | Resp 19 | Wt 172.8 lb

## 2024-07-05 DIAGNOSIS — Z818 Family history of other mental and behavioral disorders: Secondary | ICD-10-CM | POA: Insufficient documentation

## 2024-07-05 DIAGNOSIS — Z800 Family history of malignant neoplasm of digestive organs: Secondary | ICD-10-CM | POA: Insufficient documentation

## 2024-07-05 DIAGNOSIS — Z9884 Bariatric surgery status: Secondary | ICD-10-CM | POA: Insufficient documentation

## 2024-07-05 DIAGNOSIS — Z79899 Other long term (current) drug therapy: Secondary | ICD-10-CM | POA: Diagnosis not present

## 2024-07-05 DIAGNOSIS — I10 Essential (primary) hypertension: Secondary | ICD-10-CM | POA: Diagnosis not present

## 2024-07-05 DIAGNOSIS — Z8249 Family history of ischemic heart disease and other diseases of the circulatory system: Secondary | ICD-10-CM | POA: Insufficient documentation

## 2024-07-05 DIAGNOSIS — T7849XA Other allergy, initial encounter: Secondary | ICD-10-CM | POA: Diagnosis not present

## 2024-07-05 DIAGNOSIS — Z83438 Family history of other disorder of lipoprotein metabolism and other lipidemia: Secondary | ICD-10-CM | POA: Diagnosis not present

## 2024-07-05 DIAGNOSIS — N920 Excessive and frequent menstruation with regular cycle: Secondary | ICD-10-CM | POA: Insufficient documentation

## 2024-07-05 DIAGNOSIS — Z8632 Personal history of gestational diabetes: Secondary | ICD-10-CM | POA: Diagnosis not present

## 2024-07-05 DIAGNOSIS — E785 Hyperlipidemia, unspecified: Secondary | ICD-10-CM | POA: Insufficient documentation

## 2024-07-05 DIAGNOSIS — Z833 Family history of diabetes mellitus: Secondary | ICD-10-CM | POA: Diagnosis not present

## 2024-07-05 DIAGNOSIS — D500 Iron deficiency anemia secondary to blood loss (chronic): Secondary | ICD-10-CM | POA: Insufficient documentation

## 2024-07-05 DIAGNOSIS — Z825 Family history of asthma and other chronic lower respiratory diseases: Secondary | ICD-10-CM | POA: Insufficient documentation

## 2024-07-05 LAB — RETIC PANEL
Immature Retic Fract: 9.6 % (ref 2.3–15.9)
RBC.: 4.96 MIL/uL (ref 3.87–5.11)
Retic Count, Absolute: 50.6 K/uL (ref 19.0–186.0)
Retic Ct Pct: 1 % (ref 0.4–3.1)
Reticulocyte Hemoglobin: 27.9 pg — ABNORMAL LOW (ref >27.9)

## 2024-07-05 LAB — CBC WITH DIFFERENTIAL (CANCER CENTER ONLY)
Abs Immature Granulocytes: 0.05 K/uL (ref 0.00–0.07)
Basophils Absolute: 0 K/uL (ref 0.0–0.1)
Basophils Relative: 1 %
Eosinophils Absolute: 0 K/uL (ref 0.0–0.5)
Eosinophils Relative: 0 %
HCT: 39.6 % (ref 36.0–46.0)
Hemoglobin: 12.8 g/dL (ref 12.0–15.0)
Immature Granulocytes: 1 %
Lymphocytes Relative: 28 %
Lymphs Abs: 1.5 K/uL (ref 0.7–4.0)
MCH: 26 pg (ref 26.0–34.0)
MCHC: 32.3 g/dL (ref 30.0–36.0)
MCV: 80.3 fL (ref 80.0–100.0)
Monocytes Absolute: 0.4 K/uL (ref 0.1–1.0)
Monocytes Relative: 8 %
Neutro Abs: 3.4 K/uL (ref 1.7–7.7)
Neutrophils Relative %: 62 %
Platelet Count: 247 K/uL (ref 150–400)
RBC: 4.93 MIL/uL (ref 3.87–5.11)
RDW: 15 % (ref 11.5–15.5)
WBC Count: 5.5 K/uL (ref 4.0–10.5)
nRBC: 0 % (ref 0.0–0.2)

## 2024-07-05 LAB — IRON AND TIBC
Iron: 69 ug/dL (ref 28–170)
Saturation Ratios: 17 % (ref 10.4–31.8)
TIBC: 416 ug/dL (ref 250–450)
UIBC: 347 ug/dL

## 2024-07-05 LAB — FERRITIN: Ferritin: 11 ng/mL (ref 11–307)

## 2024-07-05 NOTE — Progress Notes (Signed)
 Hematology/Oncology Progress note [phone] [phone]           REFERRING PROVIDER: Vicci Duwaine SQUIBB, DO   CHIEF COMPLAINTS/REASON FOR VISIT:  iron  deficiency anemia.   ASSESSMENT & PLAN:   Iron  deficiency anemia due to chronic blood loss Labs are reviewed and discussed with patient. Lab Results  Component Value Date   HGB 12.8 07/05/2024   TIBC 416 07/05/2024   IRONPCTSAT 17 07/05/2024   FERRITIN 11 07/05/2024    Lab results are consistent with iron  deficiency anemia. Previously patient had allergic reaction to Dermabond glue which occurred after Venofer  treatment.  Both hemoglobin and iron  panel have improved.  Oral iron  supplementation appears to be effective. Continue iron  rich food, vitamin c and otc iron  supplementation.    No orders of the defined types were placed in this encounter.  Follow-up in 3 months. All questions were answered. The patient knows to call the clinic with any problems, questions or concerns.  Zelphia Cap, MD, PhD [facility] Hematology Oncology 07/05/2024   HISTORY OF PRESENTING ILLNESS:   Bonnie Armstrong is a  43 y.o.  female with PMH listed below was seen in consultation at the request of  Vicci, Megan P, DO  for evaluation of iron  deficiency anemia  Discussed the use of AI scribe software for clinical note transcription with the patient, who gave verbal consent to proceed.   She reports he history of iron  deficiency anemia, since her modified duodenal switch surgery in 2023.  12/28/2023 hemoglobin 10.4, MCV 74, iron  saturation 4, TIBC 409, ferritin 5.  She has been on iron  supplements, including ferrous sulfate , since the surgery, but there has been minimal improvement in her anemia.  She underwent a modified duodenal switch surgery in 2023' Despite taking oral iron  supplements, she continues to experience low hemoglobin levels. She experiences heavy menstrual bleeding, describing it as 'an astronomical amount'  that does not respond to birth control. She underwent an endometrial ablation on Dec 19, 2023, and is currently awaiting to see if it has reduced her menstrual flow.  She is scheduled for a paniculectomy and Monsplasty on January 16, 2024, due to complications from previous pregnancies and significant abdominal skin issues.  She consumes at least three cups of ice daily, which may indicate pica, a condition often associated with iron  deficiency anemia. No constipation issues that cannot be managed with over-the-counter medications, and no blood in her stool or dark stools.  She previously tolerated 2 doses of Venofer .  S/p underwent panniculectomy with upper abdominal add-on and mons plasty with Dr. Lowery on 01/16/2024  She developed allergic reaction to Dermabond glue, and this happened after she got 3rd dose of Venofer .  4th dose was held.   INTERVAL HISTORY Bonnie Armstrong is a 43 y.o. female who has above history reviewed by me today presents for follow up visit for iron  deficiency anemia.   Today patient reports feeling well.  She takes oral iron  supplementation and vitamin C. Patient follows up with plastic surgeon and there is plan for capsule excision of abdominal pannus surgery.   MEDICAL HISTORY:  Past Medical History:  Diagnosis Date   Abnormal uterine bleeding (AUB)    Anemia 2023   DM (diabetes mellitus), type 2 (HCC)    had bariatric surgery and last weight and a1c is now 5.3 as of 10-10-23-no meds   Gestational diabetes    Hyperlipidemia    Hypertension 2020   had bariatric surgery lost weight and was  able to come off bp meds   IFG (impaired fasting glucose)    Menorrhagia with regular cycle    Morbid obesity (HCC) 02/28/2015   Panniculitis    S/P gastric sleeve procedure     SURGICAL HISTORY: Past Surgical History:  Procedure Laterality Date   BARIATRIC SURGERY  02/26/2022   CESAREAN SECTION  05/23/2001   HYSTEROSCOPY WITH D & C N/A 12/19/2023   HSC w/NOVASURE  ABLATION   WISDOM TOOTH EXTRACTION      SOCIAL HISTORY: Social History   Socioeconomic History   Marital status: Married    Spouse name: Not on file   Number of children: Not on file   Years of education: Not on file   Highest education level: Bachelor's degree (e.g., BA, AB, BS)  Occupational History   Not on file  Tobacco Use   Smoking status: Never   Smokeless tobacco: Never  Vaping Use   Vaping status: Never Used  Substance and Sexual Activity   Alcohol use: No   Drug use: No   Sexual activity: Yes    Birth control/protection: None  Other Topics Concern   Not on file  Social History Narrative   Not on file   Social Drivers of Health   Financial Resource Strain: Low Risk  (04/18/2024)   Overall Financial Resource Strain (CARDIA)    Difficulty of Paying Living Expenses: Not very hard  Food Insecurity: No Food Insecurity (04/18/2024)   Hunger Vital Sign    Worried About Running Out of Food in the Last Year: Never true    Ran Out of Food in the Last Year: Never true  Transportation Needs: No Transportation Needs (04/18/2024)   PRAPARE - Administrator, Civil Service (Medical): No    Lack of Transportation (Non-Medical): No  Physical Activity: Sufficiently Active (04/18/2024)   Exercise Vital Sign    Days of Exercise per Week: 5 days    Minutes of Exercise per Session: 30 min  Stress: No Stress Concern Present (04/18/2024)   Bonnie Armstrong of Occupational Health - Occupational Stress Questionnaire    Feeling of Stress: Not at all  Social Connections: Socially Integrated (04/18/2024)   Social Connection and Isolation Panel    Frequency of Communication with Friends and Family: More than three times a week    Frequency of Social Gatherings with Friends and Family: More than three times a week    Attends Religious Services: More than 4 times per year    Active Member of [employer] or Organizations: Yes    Attends Engineer, Structural: More than 4 times per  year    Marital Status: Married  Catering Manager Violence: Not At Risk (01/03/2024)   Humiliation, Afraid, Rape, and Kick questionnaire    Fear of Current or Ex-Partner: No    Emotionally Abused: No    Physically Abused: No    Sexually Abused: No    FAMILY HISTORY: Family History  Problem Relation Age of Onset   Hypertension Mother    Hyperlipidemia Mother    Hypothyroidism Mother    CAD Father    Heart disease Father    Heart attack Father        Age 71 at the time   Diabetes Maternal Grandfather    COPD Paternal Grandmother    Cancer Paternal Grandfather        colon   Heart attack Paternal Grandfather        Died at 16 of heart attack  ADD / ADHD Brother    Asthma Brother    Heart attack Paternal Uncle        In his early 23s at the time    ALLERGIES:  is allergic to other.  MEDICATIONS:  Current Outpatient Medications  Medication Sig Dispense Refill   CALCIUM  CITRATE PO Take 600 mg by mouth 3 (three) times daily. [place] Calcium  Citrate     Cholecalciferol (FT VITAMIN D3) 125 MCG (5000 UT) TABS Take 5,000 Units by mouth daily with lunch. Bariactric Advantage Vitamin D  Capsule 5,000 IU     hydrochlorothiazide  (HYDRODIURIL ) 25 MG tablet Take 1 tablet (25 mg total) by mouth daily as needed. 30 tablet 3   Multiple Vitamins-Minerals (BARIATRIC MULTIVITAMIN/IRON  PO) Take 1 tablet by mouth in the morning. BariMelts Once Daily Bariatric Multivitamin with Iron      phentermine 30 MG capsule Take 30 mg by mouth every morning.     topiramate (TOPAMAX) 25 MG tablet Take 25 mg by mouth 2 (two) times daily.     Ferrous Gluconate-C-Folic Acid  (IRON -C PO) Take 1 tablet by mouth in the morning. Bariatric Fusion Iron  Supplement 60mg  with Vitamin C (Patient not taking: Reported on 07/05/2024)     No current facility-administered medications for this visit.    Review of Systems  Constitutional:  Positive for fatigue. Negative for appetite change, chills and fever.  HENT:    Negative for hearing loss and voice change.   Eyes:  Negative for eye problems.  Respiratory:  Negative for chest tightness and cough.   Cardiovascular:  Negative for chest pain and palpitations.  Gastrointestinal:  Negative for abdominal distention, abdominal pain and blood in stool.  Endocrine: Negative for hot flashes.  Genitourinary:  Negative for difficulty urinating and frequency.   Musculoskeletal:  Negative for arthralgias.  Skin:  Negative for itching and rash.  Neurological:  Negative for extremity weakness.  Hematological:  Negative for adenopathy.  Psychiatric/Behavioral:  Positive for sleep disturbance. Negative for confusion.    PHYSICAL EXAMINATION: ECOG PERFORMANCE STATUS: 1 - Symptomatic but completely ambulatory Vitals:   07/05/24 1010  BP: 139/89  Pulse: (!) 53  Resp: 19  Temp: 97.6 F (36.4 C)  SpO2: 98%   Filed Weights   07/05/24 1010  Weight: 172 lb 12.8 oz (78.4 kg)    Physical Exam Constitutional:      General: She is not in acute distress. HENT:     Head: Normocephalic and atraumatic.  Eyes:     General: No scleral icterus. Cardiovascular:     Rate and Rhythm: Normal rate and regular rhythm.     Heart sounds: Normal heart sounds.  Pulmonary:     Effort: Pulmonary effort is normal. No respiratory distress.     Breath sounds: No wheezing.  Abdominal:     General: Bowel sounds are normal. There is no distension.     Palpations: Abdomen is soft.  Musculoskeletal:        General: No deformity. Normal range of motion.     Cervical back: Normal range of motion and neck supple.  Skin:    General: Skin is warm and dry.     Findings: No erythema or rash.  Neurological:     Mental Status: She is alert and oriented to person, place, and time. Mental status is at baseline.     Cranial Nerves: No cranial nerve deficit.  Psychiatric:        Mood and Affect: Mood normal.     LABORATORY DATA:  I have reviewed the data as listed    Latest Ref Rng &  Units 07/05/2024    9:44 AM 04/04/2024   10:20 AM 12/28/2023    9:38 AM  CBC  WBC 4.0 - 10.5 K/uL 5.5  4.9  5.4   Hemoglobin 12.0 - 15.0 g/dL 87.1  88.6  89.5   Hematocrit 36.0 - 46.0 % 39.6  36.1  35.6   Platelets 150 - 400 K/uL 247  278  328       Latest Ref Rng & Units 04/18/2024   10:05 AM 12/28/2023    9:38 AM 10/10/2023    3:32 PM  CMP  Glucose 70 - 99 mg/dL 95  98  78   BUN 6 - 24 mg/dL 15  14  15    Creatinine 0.57 - 1.00 mg/dL 9.34  9.25  9.35   Sodium 134 - 144 mmol/L 139  138  137   Potassium 3.5 - 5.2 mmol/L 3.9  4.2  3.4   Chloride 96 - 106 mmol/L 105  105  104   CO2 20 - 29 mmol/L 20  21  21    Calcium  8.7 - 10.2 mg/dL 8.7  8.9  9.1   Total Protein 6.0 - 8.5 g/dL 6.2   6.7   Total Bilirubin 0.0 - 1.2 mg/dL 0.3   0.4   Alkaline Phos 44 - 121 IU/L 79   84   AST 0 - 40 IU/L 19   30   ALT 0 - 32 IU/L 27   37       RADIOGRAPHIC STUDIES: I have personally reviewed the radiological images as listed and agreed with the findings in the report. No results found.

## 2024-07-05 NOTE — Assessment & Plan Note (Signed)
 Labs are reviewed and discussed with patient. Lab Results  Component Value Date   HGB 12.8 07/05/2024   TIBC 416 07/05/2024   IRONPCTSAT 17 07/05/2024   FERRITIN 11 07/05/2024    Lab results are consistent with iron  deficiency anemia. Previously patient had allergic reaction to Dermabond glue which occurred after Venofer  treatment.  Both hemoglobin and iron  panel have improved.  Oral iron  supplementation appears to be effective. Continue iron  rich food, vitamin c and otc iron  supplementation.

## 2024-07-06 ENCOUNTER — Encounter: Payer: Self-pay | Admitting: Oncology

## 2024-07-06 NOTE — Telephone Encounter (Signed)
-----   Message from Zelphia Cap sent at 07/05/2024  7:30 PM EST ----- Please let patient know that her iron  parameters are within normal limits, improved from previous levels.  This indicates that oral iron  supplementation is effective.  I recommend patient to continue  oral iron  supplementation and vitamin C.  No need for IV iron  for now.  Recommend to follow-up in 4 months lab prior to MD +/- Venofer .  Labs are ordered.  Please arrange thank you ----- Message ----- From: Interface, Lab In [place] Sent: 07/05/2024   9:55 AM EST To: Zelphia Cap, MD

## 2024-07-06 NOTE — Telephone Encounter (Signed)
 Called and let patient know that her iron  parameters are within normal limits, improved from previous levels. This indicates that oral iron  supplementation is effective. I informed her that Dr. Babara recommends to continue oral iron  supplementation and vitamin C and that there is no need for IV iron  for now. Also informed her that Dr. Babara would like to follow-up in 4 months lab prior to MD +/- Venofer . I will have Rosina schedule and notify patient of appointment date and time.

## 2024-07-15 ENCOUNTER — Telehealth: Admitting: Family

## 2024-07-15 DIAGNOSIS — R399 Unspecified symptoms and signs involving the genitourinary system: Secondary | ICD-10-CM | POA: Diagnosis not present

## 2024-07-15 MED ORDER — CEPHALEXIN 500 MG PO CAPS: 500.0000 mg | ORAL_CAPSULE | Freq: Two times a day (BID) | ORAL | 0 refills | Status: DC

## 2024-07-15 NOTE — Progress Notes (Signed)

## 2024-07-17 ENCOUNTER — Ambulatory Visit: Admitting: Plastic Surgery

## 2024-07-24 ENCOUNTER — Encounter: Payer: Self-pay | Admitting: Family Medicine

## 2024-07-30 ENCOUNTER — Encounter: Admission: RE | Disposition: A | Payer: Self-pay | Source: Home / Self Care | Attending: Gastroenterology

## 2024-07-30 ENCOUNTER — Ambulatory Visit: Admitting: Certified Registered Nurse Anesthetist

## 2024-07-30 ENCOUNTER — Encounter: Payer: Self-pay | Admitting: Gastroenterology

## 2024-07-30 ENCOUNTER — Ambulatory Visit
Admission: RE | Admit: 2024-07-30 | Discharge: 2024-07-30 | Disposition: A | Attending: Gastroenterology | Admitting: Gastroenterology

## 2024-07-30 DIAGNOSIS — D649 Anemia, unspecified: Secondary | ICD-10-CM | POA: Insufficient documentation

## 2024-07-30 DIAGNOSIS — Z1211 Encounter for screening for malignant neoplasm of colon: Secondary | ICD-10-CM | POA: Insufficient documentation

## 2024-07-30 DIAGNOSIS — D120 Benign neoplasm of cecum: Secondary | ICD-10-CM | POA: Insufficient documentation

## 2024-07-30 DIAGNOSIS — K641 Second degree hemorrhoids: Secondary | ICD-10-CM

## 2024-07-30 DIAGNOSIS — K635 Polyp of colon: Secondary | ICD-10-CM

## 2024-07-30 DIAGNOSIS — Z833 Family history of diabetes mellitus: Secondary | ICD-10-CM | POA: Insufficient documentation

## 2024-07-30 DIAGNOSIS — Z8249 Family history of ischemic heart disease and other diseases of the circulatory system: Secondary | ICD-10-CM | POA: Diagnosis not present

## 2024-07-30 DIAGNOSIS — E119 Type 2 diabetes mellitus without complications: Secondary | ICD-10-CM | POA: Diagnosis not present

## 2024-07-30 DIAGNOSIS — I10 Essential (primary) hypertension: Secondary | ICD-10-CM | POA: Diagnosis not present

## 2024-07-30 DIAGNOSIS — Z79899 Other long term (current) drug therapy: Secondary | ICD-10-CM | POA: Insufficient documentation

## 2024-07-30 DIAGNOSIS — E782 Mixed hyperlipidemia: Secondary | ICD-10-CM | POA: Diagnosis not present

## 2024-07-30 HISTORY — PX: COLONOSCOPY: SHX5424

## 2024-07-30 HISTORY — PX: POLYPECTOMY: SHX149

## 2024-07-30 SURGERY — COLONOSCOPY
Anesthesia: General

## 2024-07-30 MED ORDER — LIDOCAINE HCL (PF) 2 % IJ SOLN
INTRAMUSCULAR | Status: AC
  Filled 2024-07-30: qty 5

## 2024-07-30 MED ORDER — MIDAZOLAM HCL 2 MG/2ML IJ SOLN
INTRAMUSCULAR | Status: AC
  Filled 2024-07-30: qty 2

## 2024-07-30 MED ORDER — DEXMEDETOMIDINE HCL IN NACL 80 MCG/20ML IV SOLN
INTRAVENOUS | Status: AC
  Filled 2024-07-30: qty 20

## 2024-07-30 MED ORDER — PROPOFOL 500 MG/50ML IV EMUL
INTRAVENOUS | Status: DC | PRN
  Administered 2024-07-30: 09:00:00 150 ug/kg/min via INTRAVENOUS

## 2024-07-30 MED ORDER — MIDAZOLAM HCL 5 MG/5ML IJ SOLN
INTRAMUSCULAR | Status: DC | PRN
  Administered 2024-07-30: 09:00:00 2 mg via INTRAVENOUS

## 2024-07-30 MED ORDER — DEXMEDETOMIDINE HCL IN NACL 80 MCG/20ML IV SOLN
INTRAVENOUS | Status: DC | PRN
  Administered 2024-07-30: 09:00:00 12 ug via INTRAVENOUS

## 2024-07-30 MED ORDER — PROPOFOL 10 MG/ML IV BOLUS
INTRAVENOUS | Status: DC | PRN
  Administered 2024-07-30: 09:00:00 30 mg via INTRAVENOUS
  Administered 2024-07-30: 09:00:00 70 mg via INTRAVENOUS

## 2024-07-30 MED ORDER — SODIUM CHLORIDE 0.9 % IV SOLN
INTRAVENOUS | Status: DC
  Administered 2024-07-30: 08:00:00 500 mL via INTRAVENOUS

## 2024-07-30 NOTE — Transfer of Care (Signed)
 Immediate Anesthesia Transfer of Care Note  Patient: Bonnie Armstrong  Procedure(s) Performed: COLONOSCOPY POLYPECTOMY, INTESTINE  Patient Location: PACU  Anesthesia Type:General  Level of Consciousness: drowsy  Airway & Oxygen Therapy: Patient Spontanous Breathing  Post-op Assessment: Report given to RN and Post -op Vital signs reviewed and stable  Post vital signs: Reviewed and stable  Last Vitals:  Vitals Value Taken Time  BP 88/51 07/30/24 09:00  Temp 36.2 C 07/30/24 09:00  Pulse 68 07/30/24 09:01  Resp 18 07/30/24 09:01  SpO2 100 % 07/30/24 09:01  Vitals shown include unfiled device data.  Last Pain:  Vitals:   07/30/24 0900  TempSrc: Temporal  PainSc: Asleep         Complications: No notable events documented.

## 2024-07-30 NOTE — Op Note (Signed)
 [facility] Gastroenterology Patient Name: Bonnie Armstrong Procedure Date: 07/30/2024 8:22 AM MRN: [id number] Account #: [id number] Date of Birth: 17-Sep-1980 Admit Type: Outpatient Age: 43 Room: [facility] ENDO ROOM 4 Gender: Female Note Status: Finalized Instrument Name: Colon Scope [id number] Procedure:             Colonoscopy Indications:           Screening for colorectal malignant neoplasm Providers:             Rogelia Copping MD, MD Referring MD:          Duwaine MYRTIS Louder (Referring MD) Medicines:             Propofol  per Anesthesia Complications:         No immediate complications. Procedure:             Pre-Anesthesia Assessment:                        - Prior to the procedure, a History and Physical was                         performed, and patient medications and allergies were                         reviewed. The patient's tolerance of previous                         anesthesia was also reviewed. The risks and benefits                         of the procedure and the sedation options and risks                         were discussed with the patient. All questions were                         answered, and informed consent was obtained. Prior                         Anticoagulants: The patient has taken no anticoagulant                         or antiplatelet agents. ASA Grade Assessment: II - A                         patient with mild systemic disease. After reviewing                         the risks and benefits, the patient was deemed in                         satisfactory condition to undergo the procedure.                        After obtaining informed consent, the colonoscope was                         passed under direct vision. Throughout the procedure,  the patient's blood pressure, pulse, and oxygen                         saturations were monitored continuously. The                         Colonoscope was introduced through  the anus and                         advanced to the the cecum, identified by appendiceal                         orifice and ileocecal valve. The colonoscopy was                         performed without difficulty. The patient tolerated                         the procedure well. The quality of the bowel                         preparation was excellent. The colonoscopy was                         performed without difficulty. The patient tolerated                         the procedure well. The quality of the bowel                         preparation was excellent. Findings:      The perianal and digital rectal examinations were normal.      A 6 mm polyp was found in the cecum. The polyp was sessile. The polyp       was removed with a cold snare. Resection and retrieval were complete.      Non-bleeding internal hemorrhoids were found during retroflexion. The       hemorrhoids were Grade II (internal hemorrhoids that prolapse but reduce       spontaneously). Impression:            - One 6 mm polyp in the cecum, removed with a cold                         snare. Resected and retrieved.                        - Non-bleeding internal hemorrhoids. Recommendation:        - Discharge patient to home.                        - Resume previous diet.                        - Continue present medications.                        - Await pathology results.                        - If the pathology report  reveals adenomatous tissue,                         then repeat the colonoscopy for surveillance in 7                         years. Procedure Code(s):     --- Professional ---                        [phone], Colonoscopy, flexible; with removal of                         tumor(s), polyp(s), or other lesion(s) by snare                         technique Diagnosis Code(s):     --- Professional ---                        Z12.11, Encounter for screening for malignant neoplasm                         of  colon                        D12.0, Benign neoplasm of cecum CPT copyright 2022 American Medical Association. All rights reserved. The codes documented in this report are preliminary and upon coder review may  be revised to meet current compliance requirements. Rogelia Copping MD, MD 07/30/2024 9:00:07 AM This report has been signed electronically. Number of Addenda: 0 Note Initiated On: 07/30/2024 8:22 AM Scope Withdrawal Time: 0 hours 8 minutes 14 seconds  Total Procedure Duration: 0 hours 12 minutes 22 seconds  Estimated Blood Loss:  Estimated blood loss: none.      [facility]

## 2024-07-30 NOTE — Anesthesia Procedure Notes (Signed)
 Date/Time: 07/30/2024 8:37 AM  Performed by: Duwayne Craven, CRNAPre-anesthesia Checklist: Patient identified, Emergency Drugs available, Suction available, Patient being monitored and Timeout performed Patient Re-evaluated:Patient Re-evaluated prior to induction Oxygen Delivery Method: Nasal cannula Induction Type: IV induction Placement Confirmation: CO2 detector and positive ETCO2

## 2024-07-30 NOTE — H&P (Signed)
 Rogelia Copping, MD [facility] [address], [address] [place], [place] [address] Phone: [phone] Fax : [phone]  Primary Care Physician:  Vicci Duwaine SQUIBB, DO Primary Gastroenterologist:  Dr. Copping  Pre-Procedure History & Physical: HPI:  Bonnie Armstrong is a 43 y.o. female is here for a screening colonoscopy.   Past Medical History:  Diagnosis Date   Abnormal uterine bleeding (AUB)    Anemia 2023   DM (diabetes mellitus), type 2 (HCC)    had bariatric surgery and last weight and a1c is now 5.3 as of 10-10-23-no meds   Gestational diabetes    Hyperlipidemia    Hypertension 2020   had bariatric surgery lost weight and was able to come off bp meds   IFG (impaired fasting glucose)    Menorrhagia with regular cycle    Morbid obesity (HCC) 02/28/2015   Panniculitis    S/P gastric sleeve procedure     Past Surgical History:  Procedure Laterality Date   BARIATRIC SURGERY  02/26/2022   CESAREAN SECTION  05/23/2001   HYSTEROSCOPY WITH D & C N/A 12/19/2023   HSC w/NOVASURE ABLATION   WISDOM TOOTH EXTRACTION      Prior to Admission medications  Medication Sig Start Date End Date Taking? Authorizing Provider  CALCIUM  CITRATE PO Take 600 mg by mouth 3 (three) times daily. [place] Calcium  Citrate   Yes [provider]  Cholecalciferol (FT VITAMIN D3) 125 MCG (5000 UT) TABS Take 5,000 Units by mouth daily with lunch. Bariactric Advantage Vitamin D  Capsule 5,000 IU   Yes [provider]  hydrochlorothiazide  (HYDRODIURIL ) 25 MG tablet Take 1 tablet (25 mg total) by mouth daily as needed. 04/18/24  Yes Johnson, Megan P, DO  Multiple Vitamins-Minerals (BARIATRIC MULTIVITAMIN/IRON  PO) Take 1 tablet by mouth in the morning. BariMelts Once Daily Bariatric Multivitamin with Iron    Yes [provider]  phentermine 30 MG capsule Take 30 mg by mouth every morning.   Yes [provider]  topiramate (TOPAMAX) 25 MG tablet Take 25 mg by mouth 2 (two) times  daily. 09/13/23  Yes [provider]  cephALEXin  (KEFLEX ) 500 MG capsule Take 1 capsule (500 mg total) by mouth 2 (two) times daily. Patient not taking: Reported on 07/30/2024 07/15/24   Lavell Bari LABOR, FNP  Ferrous Gluconate-C-Folic Acid  (IRON -C PO) Take 1 tablet by mouth in the morning. Bariatric Fusion Iron  Supplement 60mg  with Vitamin C Patient not taking: Reported on 07/05/2024    [provider]    Allergies as of 04/19/2024 - Review Complete 04/19/2024  Allergen Reaction Noted   Other Rash and Other (See Comments) 01/30/2024    Family History  Problem Relation Age of Onset   Hypertension Mother    Hyperlipidemia Mother    Hypothyroidism Mother    CAD Father    Heart disease Father    Heart attack Father        Age 1 at the time   Diabetes Maternal Grandfather    COPD Paternal Grandmother    Cancer Paternal Grandfather        colon   Heart attack Paternal Grandfather        Died at 93 of heart attack   ADD / ADHD Brother    Asthma Brother    Heart attack Paternal Uncle        In his early 25s at the time    Social History   Socioeconomic History   Marital status: Married    Spouse name:  Not on file   Number of children: Not on file   Years of education: Not on file   Highest education level: Bachelor's degree (e.g., BA, AB, BS)  Occupational History   Not on file  Tobacco Use   Smoking status: Never   Smokeless tobacco: Never  Vaping Use   Vaping status: Never Used  Substance and Sexual Activity   Alcohol use: No   Drug use: No   Sexual activity: Yes    Birth control/protection: None  Other Topics Concern   Not on file  Social History Narrative   Not on file   Social Drivers of Health   Tobacco Use: Low Risk (07/30/2024)   Patient History    Smoking Tobacco Use: Never    Smokeless Tobacco Use: Never    Passive Exposure: Not on file  Financial Resource Strain: Low Risk (04/18/2024)   Overall Financial Resource Strain (CARDIA)     Difficulty of Paying Living Expenses: Not very hard  Food Insecurity: No Food Insecurity (04/18/2024)   Epic    Worried About Radiation Protection Practitioner of Food in the Last Year: Never true    Ran Out of Food in the Last Year: Never true  Transportation Needs: No Transportation Needs (04/18/2024)   Epic    Lack of Transportation (Medical): No    Lack of Transportation (Non-Medical): No  Physical Activity: Sufficiently Active (04/18/2024)   Exercise Vital Sign    Days of Exercise per Week: 5 days    Minutes of Exercise per Session: 30 min  Stress: No Stress Concern Present (04/18/2024)   [employer] of Occupational Health - Occupational Stress Questionnaire    Feeling of Stress: Not at all  Social Connections: Socially Integrated (04/18/2024)   Social Connection and Isolation Panel    Frequency of Communication with Friends and Family: More than three times a week    Frequency of Social Gatherings with Friends and Family: More than three times a week    Attends Religious Services: More than 4 times per year    Active Member of [employer] or Organizations: Yes    Attends Engineer, Structural: More than 4 times per year    Marital Status: Married  Catering Manager Violence: Not At Risk (01/03/2024)   Humiliation, Afraid, Rape, and Kick questionnaire    Fear of Current or Ex-Partner: No    Emotionally Abused: No    Physically Abused: No    Sexually Abused: No  Depression (PHQ2-9): Low Risk (04/18/2024)   Depression (PHQ2-9)    PHQ-2 Score: 3  Alcohol Screen: Not on file  Housing: Low Risk (04/18/2024)   Epic    Unable to Pay for Housing in the Last Year: No    Number of Times Moved in the Last Year: 0    Homeless in the Last Year: No  Utilities: Not on file  Health Literacy: Not on file    Review of Systems: See HPI, otherwise negative ROS  Physical Exam: BP 133/82   Pulse 91   Temp (!) 97.1 F (36.2 C) (Temporal)   Resp 16   Ht 5' 3 (1.6 m)   Wt 77.1 kg   SpO2 100%   BMI 30.11 kg/m   General:   Alert,  pleasant and cooperative in NAD Head:  Normocephalic and atraumatic. Neck:  Supple; no masses or thyromegaly. Lungs:  Clear throughout to auscultation.    Heart:  Regular rate and rhythm. Abdomen:  Soft, nontender and nondistended. Normal bowel sounds, without  guarding, and without rebound.   Neurologic:  Alert and  oriented x4;  grossly normal neurologically.  Impression/Plan: Bonnie Armstrong is now here to undergo a screening colonoscopy.  Risks, benefits, and alternatives regarding colonoscopy have been reviewed with the patient.  Questions have been answered.  All parties agreeable.

## 2024-07-30 NOTE — Anesthesia Preprocedure Evaluation (Signed)
 Anesthesia Evaluation  Patient identified by MRN, date of birth, ID band Patient awake    Reviewed: Allergy & Precautions, NPO status , Patient's Chart, lab work & pertinent test results  Airway Mallampati: II  TM Distance: >3 FB Neck ROM: full    Dental  (+) Teeth Intact   Pulmonary neg pulmonary ROS   Pulmonary exam normal        Cardiovascular Exercise Tolerance: Good hypertension, Pt. on medications negative cardio ROS Normal cardiovascular exam Rhythm:Regular     Neuro/Psych negative neurological ROS  negative psych ROS   GI/Hepatic negative GI ROS, Neg liver ROS,,,  Endo/Other  negative endocrine ROSdiabetes, Type 2, Oral Hypoglycemic Agents    Renal/GU negative Renal ROS  negative genitourinary   Musculoskeletal   Abdominal Normal abdominal exam  (+)   Peds negative pediatric ROS (+)  Hematology negative hematology ROS (+) Blood dyscrasia, anemia   Anesthesia Other Findings Past Medical History: No date: Abnormal uterine bleeding (AUB) 2023: Anemia No date: DM (diabetes mellitus), type 2 (HCC)     Comment:  had bariatric surgery and last weight and a1c is now 5.3              as of 10-10-23-no meds No date: Gestational diabetes No date: Hyperlipidemia 2020: Hypertension     Comment:  had bariatric surgery lost weight and was able to come               off bp meds No date: IFG (impaired fasting glucose) No date: Menorrhagia with regular cycle 02/28/2015: Morbid obesity (HCC) No date: Panniculitis No date: S/P gastric sleeve procedure  Past Surgical History: 02/26/2022: BARIATRIC SURGERY 05/23/2001: CESAREAN SECTION 12/19/2023: HYSTEROSCOPY WITH D & C; N/A     Comment:  HSC w/NOVASURE ABLATION No date: WISDOM TOOTH EXTRACTION  BMI    Body Mass Index: 30.11 kg/m      Reproductive/Obstetrics negative OB ROS                              Anesthesia Physical Anesthesia  Plan  ASA: 2  Anesthesia Plan: General   Post-op Pain Management:    Induction: Intravenous  PONV Risk Score and Plan: Propofol  infusion and TIVA  Airway Management Planned: Natural Airway and Nasal Cannula  Additional Equipment:   Intra-op Plan:   Post-operative Plan:   Informed Consent: I have reviewed the patients History and Physical, chart, labs and discussed the procedure including the risks, benefits and alternatives for the proposed anesthesia with the patient or authorized representative who has indicated his/her understanding and acceptance.     Dental Advisory Given  Plan Discussed with: CRNA  Anesthesia Plan Comments:         Anesthesia Quick Evaluation

## 2024-07-30 NOTE — Anesthesia Postprocedure Evaluation (Signed)
 Anesthesia Post Note  Patient: Bonnie Armstrong  Procedure(s) Performed: COLONOSCOPY POLYPECTOMY, INTESTINE  Patient location during evaluation: PACU Anesthesia Type: General Level of consciousness: awake and awake and alert Pain management: satisfactory to patient Vital Signs Assessment: post-procedure vital signs reviewed and stable Respiratory status: spontaneous breathing Cardiovascular status: stable Anesthetic complications: no   No notable events documented.   Last Vitals:  Vitals:   07/30/24 0918 07/30/24 0925  BP: 111/76 105/79  Pulse: 69 70  Resp: 13 17  Temp: (!) 36.1 C (!) 36.2 C  SpO2: 100% 100%    Last Pain:  Vitals:   07/30/24 0925  TempSrc: Temporal  PainSc: 0-No pain                 VAN STAVEREN,Thayer Embleton

## 2024-07-31 ENCOUNTER — Encounter: Payer: Self-pay | Admitting: Gastroenterology

## 2024-07-31 ENCOUNTER — Ambulatory Visit: Payer: Self-pay | Admitting: Gastroenterology

## 2024-07-31 LAB — SURGICAL PATHOLOGY

## 2024-08-02 DIAGNOSIS — Z862 Personal history of diseases of the blood and blood-forming organs and certain disorders involving the immune mechanism: Secondary | ICD-10-CM | POA: Diagnosis not present

## 2024-08-02 DIAGNOSIS — K912 Postsurgical malabsorption, not elsewhere classified: Secondary | ICD-10-CM | POA: Diagnosis not present

## 2024-08-02 DIAGNOSIS — Z9884 Bariatric surgery status: Secondary | ICD-10-CM | POA: Diagnosis not present

## 2024-08-02 DIAGNOSIS — E669 Obesity, unspecified: Secondary | ICD-10-CM | POA: Diagnosis not present

## 2024-08-02 DIAGNOSIS — Z713 Dietary counseling and surveillance: Secondary | ICD-10-CM | POA: Diagnosis not present

## 2024-09-14 ENCOUNTER — Encounter: Payer: Self-pay | Admitting: Oncology

## 2024-09-16 ENCOUNTER — Encounter: Payer: Self-pay | Admitting: Oncology

## 2024-09-21 ENCOUNTER — Ambulatory Visit: Admitting: Plastic Surgery

## 2024-09-21 VITALS — BP 134/78 | HR 112

## 2024-09-21 DIAGNOSIS — M793 Panniculitis, unspecified: Secondary | ICD-10-CM

## 2024-09-21 DIAGNOSIS — S3011XA Contusion of abdominal wall, initial encounter: Secondary | ICD-10-CM | POA: Insufficient documentation

## 2024-09-21 NOTE — Progress Notes (Signed)
" ° °  Subjective:    Patient ID: Bonnie Armstrong, female    DOB: 22-Apr-1981, 44 y.o.   MRN: [id number]  The patient is a 44 year old female here for follow-up on her abdomen.  She definitely has a fluid collection seroma on the medial aspect of the lower abdomen.  Most likely she has formed a seroma which is why it is not absorbing.  There is no sign of infection, redness or drainage.  No fevers and she is otherwise healthy. The umbilicus has excess skin superiorly which we can trim.  We will not use Dermabond or Steri-Strips due to the skin reaction.  She would like to add liposuction to the lateral breasts.      Review of Systems  Constitutional: Negative.   Eyes: Negative.   Respiratory: Negative.    Cardiovascular: Negative.   Gastrointestinal: Negative.   Endocrine: Negative.   Genitourinary: Negative.        Objective:   Physical Exam Constitutional:      Appearance: Normal appearance.  HENT:     Head: Atraumatic.  Cardiovascular:     Rate and Rhythm: Normal rate.     Pulses: Normal pulses.  Skin:    Capillary Refill: Capillary refill takes less than 2 seconds.  Neurological:     Mental Status: She is alert and oriented to person, place, and time.  Psychiatric:        Mood and Affect: Mood normal.        Behavior: Behavior normal.        Thought Content: Thought content normal.        Judgment: Judgment normal.        Assessment & Plan:     ICD-10-CM   1. Panniculitis  M79.3     2. Abdominal wall seroma, initial encounter  S30.11XA       Pictures were obtained of the patient and placed in the chart with the patient's or guardian's permission.  The patient would like to add the lateral breast area.  This is reasonable.    "

## 2024-10-18 ENCOUNTER — Ambulatory Visit: Admitting: Family Medicine

## 2024-10-25 ENCOUNTER — Inpatient Hospital Stay

## 2024-11-05 ENCOUNTER — Inpatient Hospital Stay

## 2024-11-05 ENCOUNTER — Inpatient Hospital Stay: Admitting: Oncology
